# Patient Record
Sex: Male | Born: 1950 | Race: Black or African American | Hispanic: No | Marital: Single | Smoking: Never smoker
Health system: Southern US, Community
[De-identification: ages and names within clinical notes are randomized; demographics above are authoritative.]

## PROBLEM LIST (undated history)

## (undated) DIAGNOSIS — M1990 Unspecified osteoarthritis, unspecified site: Secondary | ICD-10-CM

## (undated) DIAGNOSIS — I10 Essential (primary) hypertension: Secondary | ICD-10-CM

## (undated) DIAGNOSIS — R972 Elevated prostate specific antigen [PSA]: Secondary | ICD-10-CM

## (undated) DIAGNOSIS — F1411 Cocaine abuse, in remission: Secondary | ICD-10-CM

## (undated) HISTORY — PX: JOINT REPLACEMENT: SHX530

## (undated) HISTORY — DX: Essential (primary) hypertension: I10

## (undated) HISTORY — PX: PROSTATE BIOPSY: SHX241

---

## 2011-06-14 ENCOUNTER — Other Ambulatory Visit: Payer: Self-pay | Admitting: Orthopedic Surgery

## 2011-06-14 ENCOUNTER — Encounter (HOSPITAL_COMMUNITY): Payer: Self-pay

## 2011-06-14 LAB — CBC
MCH: 27.6 pg (ref 26.0–34.0)
MCV: 85 fL (ref 78.0–100.0)
Platelets: 188 10*3/uL (ref 150–400)
RDW: 13.2 % (ref 11.5–15.5)

## 2011-06-14 LAB — URINALYSIS, ROUTINE W REFLEX MICROSCOPIC
Glucose, UA: NEGATIVE mg/dL
Leukocytes, UA: NEGATIVE
Nitrite: NEGATIVE
Specific Gravity, Urine: 1.021 (ref 1.005–1.030)
pH: 6.5 (ref 5.0–8.0)

## 2011-06-14 LAB — COMPREHENSIVE METABOLIC PANEL
AST: 38 U/L — ABNORMAL HIGH (ref 0–37)
Albumin: 3.8 g/dL (ref 3.5–5.2)
Calcium: 9.8 mg/dL (ref 8.4–10.5)
Creatinine, Ser: 1.08 mg/dL (ref 0.50–1.35)
GFR calc non Af Amer: >60 mL/min (ref >60)

## 2011-06-14 LAB — APTT: aPTT: 28 seconds (ref 24–37)

## 2011-06-14 LAB — PROTIME-INR
INR: 1.07 (ref 0.00–1.49)
Prothrombin Time: 14.1 seconds (ref 11.6–15.2)

## 2011-06-14 LAB — SURGICAL PCR SCREEN: Staphylococcus aureus: NEGATIVE

## 2011-06-21 ENCOUNTER — Inpatient Hospital Stay (HOSPITAL_COMMUNITY)
Admission: RE | Admit: 2011-06-21 | Discharge: 2011-06-24 | DRG: 470 | Disposition: A | Discharge status: Home Health | Payer: Self-pay | Source: Ambulatory Visit | Attending: Orthopedic Surgery | Admitting: Orthopedic Surgery

## 2011-06-21 DIAGNOSIS — Z01812 Encounter for preprocedural laboratory examination: Secondary | ICD-10-CM

## 2011-06-21 DIAGNOSIS — E871 Hypo-osmolality and hyponatremia: Secondary | ICD-10-CM | POA: Diagnosis not present

## 2011-06-21 DIAGNOSIS — M1710 Unilateral primary osteoarthritis, unspecified knee: Principal | ICD-10-CM | POA: Diagnosis present

## 2011-06-21 HISTORY — PX: KNEE ARTHROPLASTY: SHX992

## 2011-06-21 LAB — TYPE AND SCREEN: ABO/RH(D): B NEG

## 2011-06-22 LAB — CBC
Hemoglobin: 11 g/dL — ABNORMAL LOW (ref 13.0–17.0)
MCV: 85.5 fL (ref 78.0–100.0)
Platelets: 145 10*3/uL — ABNORMAL LOW (ref 150–400)
RBC: 3.93 MIL/uL — ABNORMAL LOW (ref 4.22–5.81)
WBC: 6.6 10*3/uL (ref 4.0–10.5)

## 2011-06-22 LAB — BASIC METABOLIC PANEL
CO2: 28 mEq/L (ref 19–32)
Chloride: 104 mEq/L (ref 96–112)
Creatinine, Ser: 1.05 mg/dL (ref 0.50–1.35)

## 2011-06-22 NOTE — Op Note (Signed)
NAMERODGER, GIANGREGORIO NO.:  [id number]  MEDICAL RECORD NO.:  [id number]  LOCATION:  0003                         FACILITY:  [facility]  PHYSICIAN:  Ollen Gross, M.D.    DATE OF BIRTH:  1951/03/19  DATE OF PROCEDURE:  06/21/2011 DATE OF DISCHARGE:                              OPERATIVE REPORT   PREOPERATIVE DIAGNOSIS:  Osteoarthritis, left knee.  POSTOPERATIVE DIAGNOSIS:  Osteoarthritis, left knee.  PROCEDURE:  Left total knee arthroplasty.  SURGEON:  Ollen Gross, MD  ASSISTANT:  Alexzandrew L. Perkins, P.A.C.  ANESTHESIA:  Spinal.  ESTIMATED BLOOD LOSS:  Minimal.  DRAIN:  Hemovac x1.  TOURNIQUET TIME:  80 minutes at 300 mmHg.  COMPLICATIONS:  None.  CONDITION:  Stable to recovery room.  BRIEF CLINICAL NOTE:  Marc [name] is a 60 year old male with severe end-stage arthritis of his left knee with progressively worsening pain and dysfunction.  He has failed nonop management, presents for total knee arthroplasty.  PROCEDURE IN DETAIL:  After successful administration of spinal anesthetic, a tourniquet was placed high on his left thigh and his left lower extremity was prepped and draped in the usual sterile fashion. Extremity was wrapped in Esmarch, knee flexed, tourniquet inflated to 300 mmHg.  His preop flexion was only about 40 degrees.  Midline incision was made with 10 blade through subcutaneous tissue to the level of the extensor mechanism.  A fresh blade was used to make a medial parapatellar arthrotomy.  This tissue was terribly scarred down.  I had to recreate the medial and lateral gutters with careful dissection subperiosteally.  The soft tissue on the proximal medial tibia was then subperiosteally elevated to the joint line with the knife and into the semimembranosus bursa with a Cobb elevator.  Tremendous amount of spurs adhered to that.  I had to remove the spurs.  Could not evert the patella as it was wanting to avulse off the tibial  tubercle.  We thus subluxed the patella laterally, flexed the knee about 60 degrees.  I removed further scar tissue and was able to eventually get the knee flexed up close to 90.  ACL and PCL were essentially gone.  There was absolutely no cartilage left in the joint.  His anatomic structures are grossly deformed by the large amount of osteophytes.  I carefully scoped out the femur separating the osteophytes from the native bone.  We then drilled into the femoral canal.  The canal was thoroughly irrigated with saline.  5-degree left valgus alignment guide was placed and distal femoral cutting block was pinned to remove 12 mm off the distal femur. I took 12 because of the large preop flexion contracture.  Distal femoral resection was made with an oscillating saw.  Sizing block was placed next and size 6 was most appropriate.  Rotation was marked off the epicondylar axis.  Size 6 cutting block was placed and the anterior, posterior and chamfer cuts were made.  Once we made these cuts, then it was easier to delineate between the osteophytes and native bone.  I was able to remove the rest of the osteophytes from the medial, lateral, and anterior aspects of the femur.  The tibia was  then subluxed forward.  There were no menisci to remove as they were completely gone.  The extramedullary tibial alignment guide was placed referencing proximally at the medial aspect of the tibial tubercle and distally along the second metatarsal axis and tibial crest. There was a large groove laterally in the tibia when I made my resection level to go just below that groove.  This was little larger resection than usual.  Thus, I decided to use an MBT tray with augments.  The resection was made with an oscillating saw.  Size 6 was the most appropriate tibial component.  Proximal tibia was prepared with a modular drill for the MBT revision tray and then the keel punch for size 6.  The trial size 6 tibia was  placed.  Intercondylar block was then placed for the size 6 on the femur.  Intercondylar cuts made.  Size 6 posterior stabilized femur was then placed. We started with a 15 mm posterior stabilized rotating platform insert trial.  I went up to 17.5 and there was still a tiny bit of play, so I realized I was going to need to place 5 mm augments both medial and lateral on the tibia to tighten up both the flexion and extension gaps.  This was done and then with a 15 mm trial, full extension was achieved with excellent varus- valgus and anterior-posterior balance throughout full range of motion. Patella was everted and there was gross defect superolateral.  I was able to remove the osteophytes and resect the patella down to thickness of about 12 mm.  41 template was placed.  Lug holes were drilled.  Trial patella was placed and it tracked normally.  Osteophytes were then removed off the posterior femur with the trial in place.  All trials were removed and the cut bone surfaces were prepared with pulsatile lavage.  Cement was mixed and once ready for implantation, the size 6 MBT revision tray with the 5 mm medial and lateral augments, the size 6 posterior stabilized femur, and the 41 patella were all cemented into place and patella was held with a clamp.  Trial 15 mm inserts were placed.  Knee held in full extension.  All extruded cement removed. When cement was fully hardened, then the permanent 15 mm posterior stabilized rotating platform insert was placed in the tibial tray.  The wound was copiously irrigated with saline solution and then the arthrotomy closed over Hemovac drain with interrupted #1 PDS.  Flexion against gravity was about 115 degrees which is a vast improvement from preop.  Tourniquet was then released.  Total time 80 minutes. Subcutaneous was then closed with interrupted 2-0 Vicryl, subcuticular with running 4-0 Monocryl.  Catheter for the Marcaine pain pump was placed and  the pump was initiated.  Incision was cleaned and dried and Steri-Strips and a bulky sterile dressing applied.  He was then placed into a knee immobilizer, awakened and transported to recovery in stable condition.     Ollen Gross, M.D.     FA/MEDQ  D:  06/21/2011  T:  06/21/2011  Job:  [id number]  Electronically Signed by Ollen Gross M.D. on 06/22/2011 03:31:35 PM

## 2011-06-23 LAB — CBC
HCT: 29.8 % — ABNORMAL LOW (ref 39.0–52.0)
MCV: 83.5 fL (ref 78.0–100.0)
RBC: 3.57 MIL/uL — ABNORMAL LOW (ref 4.22–5.81)
RDW: 12.8 % (ref 11.5–15.5)
WBC: 8.4 10*3/uL (ref 4.0–10.5)

## 2011-06-23 LAB — BASIC METABOLIC PANEL
BUN: 7 mg/dL (ref 6–23)
CO2: 26 mEq/L (ref 19–32)
Chloride: 101 mEq/L (ref 96–112)
Creatinine, Ser: 0.92 mg/dL (ref 0.50–1.35)

## 2011-06-24 LAB — CBC
HCT: 27.8 % — ABNORMAL LOW (ref 39.0–52.0)
MCHC: 33.5 g/dL (ref 30.0–36.0)
MCV: 83.2 fL (ref 78.0–100.0)
RDW: 12.8 % (ref 11.5–15.5)

## 2011-06-24 LAB — BASIC METABOLIC PANEL
BUN: 7 mg/dL (ref 6–23)
Creatinine, Ser: 0.84 mg/dL (ref 0.50–1.35)
GFR calc Af Amer: >60 mL/min (ref >60)
GFR calc non Af Amer: >60 mL/min (ref >60)

## 2011-07-10 NOTE — H&P (Signed)
[name], [name]                ACCOUNT NO.:  [id number]  MEDICAL RECORD NO.:  [id number]  LOCATION:                                 FACILITY:  PHYSICIAN:  Ollen Gross, M.D.    DATE OF BIRTH:  [birth year]  DATE OF ADMISSION:  06/21/2011 DATE OF DISCHARGE:                             HISTORY & PHYSICAL   CHIEF COMPLAINT:  Left knee pain.  HISTORY OF PRESENT ILLNESS:  The patient is a 60 year old male who has been seen by Dr. Lequita Halt for ongoing left knee pain.  He has known end- stage arthritis and has had multiple surgeries on his knee in the past. He has been told he needed a knee replacement years ago but has put on off.  He is at a point now where he would like to have that done.  Risks and benefits have been discussed.  He would like to proceed with surgery.  ALLERGIES:  No known drug allergies.  CURRENT MEDICATIONS:  Omega fatty acid, multivitamin.  PAST MEDICAL HISTORY:  Negative except for the arthritis.  PAST SURGICAL HISTORY:  He has had left knee surgery x4.  FAMILY HISTORY:  Father living, age 90, with prostate cancer.  Mother living, age 38, with stroke.  He has 2 brothers, age 33 and 71, in good health.  One brother passed away age 49 with heart attack.  SOCIAL HISTORY:  He is single, works as a Midwife.  Nonsmoker. Social, very rare intake of alcohol.  Does have a caregiver lined up, has 2 steps entering his home.  REVIEW OF SYSTEMS:  GENERAL:  No fevers, chills, or night sweats. NEURO:  No seizures, syncope, or paralysis.  RESPIRATORY:  No shortness breath, productive cough, or hemoptysis.  CARDIOVASCULAR:  No chest pain, no angina, no orthopnea.  GI:  No nausea, vomiting, diarrhea, or constipation.  GU:  No dysuria, hematuria, or discharge. MUSCULOSKELETAL: Left knee pain.  PHYSICAL EXAMINATION:  VITAL SIGNS:  Pulse 76, respirations 12, blood pressure 118/80. GENERAL:  A 60 year old African American male, tall, muscular frame, alert, oriented,  cooperative.  Good historian. HEENT:  Normocephalic, atraumatic.  Pupils round and reactive.  EOMs intact. NECK:  Supple. CHEST:  Clear. HEART:  Regular rate and rhythm without murmur.  S1, S2 noted. ABDOMEN:  Soft, nontender.  Bowel sounds present. RECTAL:  Not done, not pertinent to present illness. BREASTS:  Not done, not pertinent to present illness. GENITALIA:  Not done, not pertinent to present illness. EXTREMITIES:  Left knee, massive swelling, especially soft tissue.  He does have some quad atrophy.  Range of motion is 10 to 85 actively but 0 to 90 passively.  He has some moderate pseudolaxity.  No gross instability.  IMPRESSION:  Osteoarthritis, left knee.  PLAN:  The patient will be admitted to [facility] to undergo left total knee replacement arthroplasty.  Surgery will be performed by Dr. Ollen Gross.     Marc [name], P.A.C.   ______________________________ Ollen Gross, M.D.    ALP/MEDQ  D:  06/22/2011  T:  06/22/2011  Job:  [id number]  Electronically Signed by Patrica Duel P.A.C. on 06/29/2011 09:34:59 AM Electronically Signed by Homero Fellers  Sible Straley M.D. on 07/10/2011 11:38:12 AM

## 2011-07-11 ENCOUNTER — Ambulatory Visit: Payer: Self-pay | Attending: Orthopedic Surgery | Admitting: Rehabilitative and Restorative Service Providers"

## 2011-07-11 DIAGNOSIS — R262 Difficulty in walking, not elsewhere classified: Secondary | ICD-10-CM | POA: Insufficient documentation

## 2011-07-11 DIAGNOSIS — IMO0001 Reserved for inherently not codable concepts without codable children: Secondary | ICD-10-CM | POA: Insufficient documentation

## 2011-07-11 DIAGNOSIS — M25569 Pain in unspecified knee: Secondary | ICD-10-CM | POA: Insufficient documentation

## 2011-07-12 ENCOUNTER — Ambulatory Visit: Payer: Self-pay | Admitting: Physical Therapy

## 2011-07-12 NOTE — Discharge Summary (Signed)
Marc [name], Marc [name] NO.:  [id number]  MEDICAL RECORD NO.:  [id number]  LOCATION:  1610                         FACILITY[facility]  [facility]  PHYSICIAN:  Ollen Gross, M.D.    DATE OF BIRTH:  05/10/1951  DATE OF ADMISSION:  06/21/2011 DATE OF DISCHARGE:  06/24/2011                              DISCHARGE SUMMARY   ADMITTING DIAGNOSIS:  Arthritis, left knee.  DISCHARGE DIAGNOSES: 1. Osteoarthritis left knee, status post left total knee replacement     arthroplasty. 2. Mild postop acute blood loss anemia. 3. Postop hyponatremia, improved.  PROCEDURE:  On June 21, 2011, left total knee.  Surgeon, Dr. Lequita Halt. Assistant, Patrica Duel, PA-C.  Anesthesia spinal.  Tourniquet time 80 minutes.  CONSULTS:  None.  BRIEF HISTORY:  Marc [name] is a 60 year old male with severe end-stage arthritis of the left knee, progressive worsening pain and dysfunction, failed nonoperative management, now presents for total knee arthroplasty.  LABORATORY DATA:  Preop CBC on admission was not scanned in the chart, but hemoglobin was 11; postop down to 10.1; last done hemoglobin and hematocrit 9.3 and 27.8.  Chem panel on admission not scanned in the chart, but the serial BMET's were followed for 72 hours;  sodium did drop from 137 to 132, back up to 133.  Remaining electrolytes remained within normal limits.  Blood group type B negative.  HOSPITAL COURSE:  The patient was admitted to [facility], taken to OR, underwent above-stated procedure without complication.  The patient tolerated procedure well, later transferred to recovery room and then to the orthopedic floor.  Started on p.o. and IV analgesic for pain control following surgery.  Given 24 hours postop IV antibiotics.  He has had moderate pain through the night of the surgery which was especially expected due to the severity of his arthritis that was found in his knees.  He was doing a little bit better on morning  of day #1. Hemovac drain still had some output, so we left it an extra day.  He had good urinary output.  Labs looked good.  His hemoglobin was stable.  By day #2, his hemoglobin was down a little bit.  He was asymptomatic with this and his sodium was off a little bit due to dilutional component, but he was diuresing his fluids well, so we just discontinued his fluids.  Once he was eating and drinking okay, we will recheck BMET next morning.  He continued to get up with therapy and had a rough night because of the first day, but he is still little bit better.  Dressing changed, incision looked very good and he continued to progress with therapy.  We pulled the Hemovac drain at time of the dressing change on day #2.  By day #3, he was doing much better.  He was up walking.  He was progressing with his therapy, had met goals, and it was decided the patient be discharged home at that time.  DISCHARGE PLAN: 1. The patient was discharged home on June 24, 2011. 2. Discharge diagnoses, please see above. 3. Discharge medications, Dilaudid, Robaxin, Xarelto. 4. Diet, as tolerated. 5. Activity, weightbearing as tolerated, total knee protocol. 6.  Follow up in 2 weeks.  DISPOSITION:  Home.  CONDITION UPON DISCHARGE:  Improved.     [name], P.A.C.   ______________________________ Ollen Gross, M.D.    ALP/MEDQ  D:  07/06/2011  T:  07/07/2011  Job:  [id number]  Electronically Signed by Patrica Duel P.A.C. on 07/11/2011 10:11:01 AM Electronically Signed by Ollen Gross M.D. on 07/12/2011 10:25:48 AM

## 2011-07-13 ENCOUNTER — Ambulatory Visit: Payer: Self-pay | Admitting: Rehabilitative and Restorative Service Providers"

## 2011-07-18 ENCOUNTER — Ambulatory Visit: Payer: Self-pay | Admitting: Physical Therapy

## 2011-07-19 ENCOUNTER — Ambulatory Visit: Payer: Self-pay | Admitting: Physical Therapy

## 2011-07-20 ENCOUNTER — Ambulatory Visit: Payer: Self-pay | Admitting: Physical Therapy

## 2011-07-24 ENCOUNTER — Ambulatory Visit: Payer: Self-pay | Admitting: Physical Therapy

## 2011-07-26 ENCOUNTER — Ambulatory Visit: Payer: Self-pay | Admitting: Rehabilitative and Restorative Service Providers"

## 2011-07-27 ENCOUNTER — Ambulatory Visit: Payer: Self-pay | Admitting: Physical Therapy

## 2011-08-01 ENCOUNTER — Ambulatory Visit: Payer: Self-pay | Attending: Orthopedic Surgery | Admitting: Physical Therapy

## 2011-08-01 DIAGNOSIS — IMO0001 Reserved for inherently not codable concepts without codable children: Secondary | ICD-10-CM | POA: Insufficient documentation

## 2011-08-01 DIAGNOSIS — R262 Difficulty in walking, not elsewhere classified: Secondary | ICD-10-CM | POA: Insufficient documentation

## 2011-08-01 DIAGNOSIS — M25569 Pain in unspecified knee: Secondary | ICD-10-CM | POA: Insufficient documentation

## 2011-08-03 ENCOUNTER — Ambulatory Visit: Payer: Self-pay | Admitting: Physical Therapy

## 2011-08-08 ENCOUNTER — Ambulatory Visit: Payer: Self-pay | Admitting: Rehabilitative and Restorative Service Providers"

## 2011-08-10 ENCOUNTER — Ambulatory Visit: Payer: Self-pay | Admitting: Physical Therapy

## 2011-08-14 ENCOUNTER — Ambulatory Visit: Payer: Self-pay | Admitting: Rehabilitative and Restorative Service Providers"

## 2011-08-17 ENCOUNTER — Ambulatory Visit: Payer: Self-pay | Admitting: Physical Therapy

## 2011-08-21 ENCOUNTER — Ambulatory Visit: Payer: Self-pay | Admitting: Rehabilitative and Restorative Service Providers"

## 2011-08-23 ENCOUNTER — Ambulatory Visit: Payer: Self-pay | Admitting: Rehabilitative and Restorative Service Providers"

## 2012-04-12 ENCOUNTER — Emergency Department (INDEPENDENT_AMBULATORY_CARE_PROVIDER_SITE_OTHER)
Admission: EM | Admit: 2012-04-12 | Discharge: 2012-04-12 | Disposition: A | Discharge status: Home / Self Care | Payer: Self-pay | Source: Home / Self Care | Attending: Emergency Medicine | Admitting: Emergency Medicine

## 2012-04-12 ENCOUNTER — Encounter (HOSPITAL_COMMUNITY): Payer: Self-pay | Admitting: Emergency Medicine

## 2012-04-12 DIAGNOSIS — J029 Acute pharyngitis, unspecified: Secondary | ICD-10-CM

## 2012-04-12 MED ORDER — ALUM & MAG HYDROXIDE-SIMETH 200-200-20 MG/5ML PO SUSP: 5.0000 mL | Freq: Four times a day (QID) | ORAL | Status: AC | PRN

## 2012-04-12 MED ORDER — DIPHENHYDRAMINE HCL 12.5 MG/5ML PO LIQD: 12.5000 mg | Freq: Four times a day (QID) | ORAL | Status: DC | PRN

## 2012-04-12 MED ORDER — GI COCKTAIL ~~LOC~~
ORAL | Status: AC
  Filled 2012-04-12: qty 30

## 2012-04-12 MED ORDER — LIDOCAINE VISCOUS 2 % MT SOLN: 10.0000 mL | Freq: Three times a day (TID) | OROMUCOSAL | Status: AC | PRN

## 2012-04-12 MED ORDER — IBUPROFEN 600 MG PO TABS: 600.0000 mg | ORAL_TABLET | Freq: Four times a day (QID) | ORAL | Status: AC | PRN

## 2012-04-12 MED ORDER — GI COCKTAIL ~~LOC~~
30.0000 mL | Freq: Once | ORAL | Status: AC
  Administered 2012-04-12: 30 mL via ORAL

## 2012-04-12 NOTE — ED Notes (Signed)
Pt c/o sore throat for 2 days that is getting progressively worse. He also has a cough and started getting a runny nose today.

## 2012-04-12 NOTE — Discharge Instructions (Signed)
Take medication as written. You may also try some Pepcid, Prevacid, or other H2 blocker. This will help with any reflux. Return if you've a fever above 100.4, if you're unable to open your mouth, if you have trouble moving your neck, or any other concerns. Go to www.goodrx.com to look up your medications. This will give you a list of where you can find your prescriptions at the most affordable prices.   Call [facility]  [phone]  If you have no primary doctor, here are some resources that may be helpful:  Medicaid-accepting [facility] Providers:   - [facility]- [address]      [phone]      Mon-Fri 9am-7pm, Sat 9am-1pm   - [facility]- [address] [place]      [phone]   - [facility]- [address] [place]      [phone]   [facility]- [address]      [phone]   - Renaye Rakers- [address] [place], [address]      [phone]      Only accepts [place] patients       after they have her name applied to their card   Self Pay (no insurance) in [place]:   - Sickle Cell Patients: Dr Willey Blade, [facility]      [address] [place]      [phone]   - [phone]   - Physician Referral Service- [phone]   - [facility]- [address] [place]      [phone]   [facility]- [address]   - [facility]- see information above      (Speak to [employer] if you do not have insurance)   - [facility]- [address] [place]      [phone]   - [facility] [place]- [address]      [phone]   - [facility]- [address]      [phone]   - Dr Julio Sicks-  [address], [place]      [phone]   - [facility]- [address]      [phone]   - [facility]- [address]      [phone]      Also  [address]      [phone]   - [facility]- [address]      [phone]      1st and 3rd Saturday every month, 10am-1pm    Other agencies that provide inexpensive medical care:     [facility]  [phone]    [facility]  [phone]    [facility]  [phone]    [facility]  [phone] [address] [place] [address]    [facility]  [phone]    [facility]  [phone] [facility] [phone]   [address] [place], [place] [address]  Chronic Pain Problems Contact [facility]  [phone] Patients need to be referred by their primary care doctor.  [facility]     [employer]  [facility]. [address] [place]                       [address]      [address] [place] [address]   [phone] (After Hours)  General Information: Finding a doctor when you do not have health insurance can be tricky. Although you are not limited by an insurance plan, you are of course limited by her finances and how much but he can pay out of pocket.  What are your options if you don't have health insurance?   1) Find a Librarian, academic and Pay Out of Pocket Although you won't have to find out who is covered by your insurance plan, it is a good idea to ask around and get recommendations. You will then need to call the office and see if the doctor you have chosen will accept you as a new patient and what types of options they offer for patients who are self-pay. Some doctors offer discounts or will set up payment plans for their patients who do not have insurance, but you will need to ask so you aren't surprised when you get to your appointment.  2) Contact Your Local Health Department Not all health departments have doctors that can see patients for sick visits, but  many do, so it is worth a call to see if yours does. If you don't know where your local health department is, you can check in your phone book. The CDC also has a tool to help you locate your state's health department, and many state websites also have listings of all of their local health departments.  3) Find a Walk-in Clinic If your illness is not likely to be very severe or complicated, you may want to try a walk in clinic. These are popping up all over the country in pharmacies, drugstores, and shopping centers. They're usually staffed by nurse practitioners or physician assistants that have been trained to treat common illnesses and complaints. They're usually fairly quick and inexpensive. However, if you have serious medical issues or chronic medical problems, these are probably not your best option

## 2012-04-12 NOTE — ED Provider Notes (Signed)
History     CSN: [id number]  Arrival date & time 04/12/12  1478   First MD Initiated Contact with Patient 04/12/12 [phone]      Chief Complaint  Patient presents with  . Sore Throat    (Consider location/radiation/quality/duration/timing/severity/associated sxs/prior treatment) HPI Comments: Patient reports sore, scratchy throat, whitish swelling on the right upper aspect of his tonsil starting 2 days ago. Reports raspy voice, pain with swallowing, running nose, and nonproductive cough starting last night. Patient's throat is sore all day. Or swelling. No alleviating factors. Tried Alka-Seltzer cold plus without relief.  No nausea, vomiting, fevers. No "hot potato" voice, sensation of throat swelling shut, facial swelling, neck pain, difficulty swallowing, trismus. No history reflux, and no reflux symptoms currently. No known sick contacts.   ROS as noted in HPI. All other ROS negative.   Patient is a 61 y.o. male presenting with pharyngitis. The history is provided by the patient. No language interpreter was used.  Sore Throat This is a new problem. The current episode started 2 days ago. The problem has been gradually worsening. Associated symptoms include headaches. Pertinent negatives include no chest pain, no abdominal pain and no shortness of breath. The symptoms are aggravated by swallowing. The symptoms are relieved by nothing. He has tried nothing for the symptoms. The treatment provided no relief.    History reviewed. No pertinent past medical history.  History reviewed. No pertinent past surgical history.  History reviewed. No pertinent family history.  History  Substance Use Topics  . Smoking status: Never Smoker   . Smokeless tobacco: Not on file  . Alcohol Use: No      Review of Systems  Respiratory: Negative for shortness of breath.   Cardiovascular: Negative for chest pain.  Gastrointestinal: Negative for abdominal pain.  Neurological: Positive for headaches.     Allergies  Review of patient's allergies indicates no known allergies.  Home Medications   Current Outpatient Rx  Name Route Sig Dispense Refill  . ALKA-SELTZER PLUS COLD PO Oral Take by mouth.      BP 139/98  Pulse 83  Temp(Src) 98.3 F (36.8 C) (Oral)  Resp 18  SpO2 100%  Physical Exam  Nursing note and vitals reviewed. Constitutional: He is oriented to person, place, and time. He appears well-developed and well-nourished.  HENT:  Head: Normocephalic and atraumatic. No trismus in the jaw.  Right Ear: Tympanic membrane normal.  Left Ear: Tympanic membrane normal.  Nose: Nose normal. Right sinus exhibits no maxillary sinus tenderness and no frontal sinus tenderness. Left sinus exhibits no maxillary sinus tenderness and no frontal sinus tenderness.  Mouth/Throat: Uvula is midline and mucous membranes are normal. Posterior oropharyngeal erythema present. No oropharyngeal exudate, posterior oropharyngeal edema or tonsillar abscesses.         Mildly tender, firm whitish mass at superior aspect of right tonsil c/w concretion. Tonsils otherwise normal. No palatal petechia.  Eyes: Conjunctivae and EOM are normal.  Neck: Normal range of motion.       Bilateral shotty cervical lymphadenopathy  Cardiovascular: Normal rate.   Pulmonary/Chest: Effort normal. No respiratory distress.  Abdominal: He exhibits no distension.  Musculoskeletal: Normal range of motion.  Neurological: He is alert and oriented to person, place, and time.  Skin: Skin is warm and dry.  Psychiatric: He has a normal mood and affect. His behavior is normal.    ED Course  Procedures (including critical care time)   Labs Reviewed  POCT RAPID STREP A [facility]  ONLY)   No results found.   No diagnosis found. Results for orders placed during the hospital encounter of 04/12/12  POCT RAPID STREP A ([facility]RG CARE ONLY)      Component Value Range   Streptococcus, Group A Screen (Direct) NEGATIVE  NEGATIVE        MDM  RAS negative. H&P are suggestive of  reflux pharyngitis, but could also have a viral pharyngitis. No evidence of peritonsillar abscess, uvulitis, epiglottitis. Discussed with patient that antibiotics not indicated at this time. Supportive treatment. Will have followup with primary care physician of choice as needed.  Luiz Blare, MD 04/12/12 1007

## 2012-09-29 ENCOUNTER — Emergency Department (HOSPITAL_COMMUNITY)
Admission: EM | Admit: 2012-09-29 | Discharge: 2012-09-29 | Disposition: A | Discharge status: Home / Self Care | Payer: Self-pay | Attending: Emergency Medicine | Admitting: Emergency Medicine

## 2012-09-29 ENCOUNTER — Encounter (HOSPITAL_COMMUNITY): Payer: Self-pay | Admitting: Emergency Medicine

## 2012-09-29 ENCOUNTER — Emergency Department (HOSPITAL_COMMUNITY): Payer: Self-pay

## 2012-09-29 DIAGNOSIS — R079 Chest pain, unspecified: Secondary | ICD-10-CM | POA: Insufficient documentation

## 2012-09-29 DIAGNOSIS — R0602 Shortness of breath: Secondary | ICD-10-CM | POA: Insufficient documentation

## 2012-09-29 DIAGNOSIS — R5381 Other malaise: Secondary | ICD-10-CM | POA: Insufficient documentation

## 2012-09-29 DIAGNOSIS — F1410 Cocaine abuse, uncomplicated: Secondary | ICD-10-CM | POA: Insufficient documentation

## 2012-09-29 LAB — CBC
HCT: 41.2 % (ref 39.0–52.0)
Hemoglobin: 14.3 g/dL (ref 13.0–17.0)
MCH: 28.9 pg (ref 26.0–34.0)
MCHC: 34.7 g/dL (ref 30.0–36.0)

## 2012-09-29 LAB — BASIC METABOLIC PANEL
BUN: 11 mg/dL (ref 6–23)
GFR calc non Af Amer: 76 mL/min — ABNORMAL LOW (ref >90)
Glucose, Bld: 115 mg/dL — ABNORMAL HIGH (ref 70–99)
Potassium: 3.2 mEq/L — ABNORMAL LOW (ref 3.5–5.1)

## 2012-09-29 LAB — RAPID URINE DRUG SCREEN, HOSP PERFORMED
Amphetamines: NOT DETECTED
Benzodiazepines: NOT DETECTED
Opiates: NOT DETECTED

## 2012-09-29 LAB — GLUCOSE, CAPILLARY

## 2012-09-29 MED ORDER — NITROGLYCERIN 0.4 MG SL SUBL
SUBLINGUAL_TABLET | SUBLINGUAL | Status: AC
  Administered 2012-09-29: 0.4 mg via SUBLINGUAL
  Filled 2012-09-29: qty 25

## 2012-09-29 MED ORDER — LORAZEPAM 2 MG/ML IJ SOLN
1.0000 mg | Freq: Once | INTRAMUSCULAR | Status: AC
  Administered 2012-09-29: 1 mg via INTRAVENOUS
  Filled 2012-09-29: qty 1

## 2012-09-29 MED ORDER — POTASSIUM CHLORIDE CRYS ER 20 MEQ PO TBCR
40.0000 meq | EXTENDED_RELEASE_TABLET | Freq: Once | ORAL | Status: AC
  Administered 2012-09-29: 40 meq via ORAL
  Filled 2012-09-29: qty 2

## 2012-09-29 MED ORDER — SODIUM CHLORIDE 0.9 % IV SOLN
INTRAVENOUS | Status: DC | PRN
  Administered 2012-09-29: 20 mL/h via INTRAVENOUS

## 2012-09-29 MED ORDER — NITROGLYCERIN 0.4 MG SL SUBL
0.4000 mg | SUBLINGUAL_TABLET | SUBLINGUAL | Status: DC | PRN
  Administered 2012-09-29 (×3): 0.4 mg via SUBLINGUAL

## 2012-09-29 MED ORDER — ASPIRIN 81 MG PO CHEW
CHEWABLE_TABLET | ORAL | Status: AC
  Administered 2012-09-29: 324 mg
  Filled 2012-09-29: qty 4

## 2012-09-29 MED ORDER — ASPIRIN 325 MG PO TABS: 325.0000 mg | ORAL_TABLET | ORAL | Status: DC

## 2012-09-29 NOTE — ED Provider Notes (Signed)
History     CSN: [id number]  Arrival date & time 09/29/12  1300   First MD Initiated Contact with Patient 09/29/12 1436      Chief Complaint  Patient presents with  . Chest Pain  . Shortness of Breath  . Weakness  . Cocaine Use     (Consider location/radiation/quality/duration/timing/severity/associated sxs/prior treatment) Patient is a 61 y.o. male presenting with chest pain and weakness. The history is provided by the patient.  Chest Pain Pertinent negatives for primary symptoms include no palpitations, no abdominal pain and no vomiting.  Pertinent negatives for associated symptoms include no numbness.    Weakness Primary symptoms do not include headaches or vomiting.  pt states used cocaine late last pm, noted onset cp, mid chest/midline, dull, not radiating, constant, not pleuritic. No associated nv, diaphoresis or sob. No change w position, no change w activity level. Constant. Denies cough or uri c/o. No fever or chills. Denies hx gerd. No leg pain or swelling, no dvt or pe hx. Denies chest wall injury, strain or fall. No headache. No numbness/weakness.  Denies hx cad or any family hx cad.     History reviewed. No pertinent past medical history.  Past Surgical History  Procedure Date  . Joint replacement     History reviewed. No pertinent family history.  History  Substance Use Topics  . Smoking status: Never Smoker   . Smokeless tobacco: Not on file  . Alcohol Use: No      Review of Systems  Constitutional: Negative for chills.  HENT: Negative for neck pain.   Eyes: Negative for visual disturbance.  Cardiovascular: Negative for palpitations and leg swelling.  Gastrointestinal: Negative for vomiting and abdominal pain.  Genitourinary: Negative for flank pain.  Musculoskeletal: Negative for back pain.  Skin: Negative for rash.  Neurological: Negative for syncope, numbness and headaches.  Hematological: Does not bruise/bleed easily.    Psychiatric/Behavioral: Negative for confusion.    Allergies  Review of patient's allergies indicates no known allergies.  Home Medications   Current Outpatient Rx  Name  Route  Sig  Dispense  Refill  . AYR SALINE NASAL GEL NA   Nasal   Place 2 puffs into the nose daily as needed. Nasal congestion         . OVER THE COUNTER MEDICATION   Oral   Take 2 tablets by mouth daily. Emergen-C dissolvable tablets.           BP 126/69  Pulse 92  Temp 98.6 F (37 C) (Oral)  Resp 20  SpO2 98%  Physical Exam  Nursing note and vitals reviewed. Constitutional: He is oriented to person, place, and time. He appears well-developed and well-nourished. No distress.  HENT:  Head: Atraumatic.  Mouth/Throat: Oropharynx is clear and moist.  Eyes: Conjunctivae normal are normal.  Neck: Neck supple. No tracheal deviation present.  Cardiovascular: Normal rate, regular rhythm, normal heart sounds and intact distal pulses.  Exam reveals no gallop and no friction rub.   No murmur heard. Pulmonary/Chest: Effort normal and breath sounds normal. No accessory muscle usage. No respiratory distress. He exhibits tenderness.       No crepitus  Abdominal: Soft. Bowel sounds are normal. He exhibits no distension. There is no tenderness.  Musculoskeletal: Normal range of motion. He exhibits no edema and no tenderness.  Neurological: He is alert and oriented to person, place, and time.  Skin: Skin is warm and dry.  Psychiatric: He has a normal mood and affect.  ED Course  Procedures (including critical care time)  Results for orders placed during the hospital encounter of 09/29/12  GLUCOSE, CAPILLARY      Component Value Range   Glucose-Capillary 105 (*) 70 - 99 mg/dL   Comment 1 Documented in Chart     Comment 2 Notify RN    CBC      Component Value Range   WBC 7.7  4.0 - 10.5 K/uL   RBC 4.95  4.22 - 5.81 MIL/uL   Hemoglobin 14.3  13.0 - 17.0 g/dL   HCT 16.1  09.6 - 04.5 %   MCV 83.2  78.0  - 100.0 fL   MCH 28.9  26.0 - 34.0 pg   MCHC 34.7  30.0 - 36.0 g/dL   RDW 40.9  81.1 - 91.4 %   Platelets 182  150 - 400 K/uL  BASIC METABOLIC PANEL      Component Value Range   Sodium 135  135 - 145 mEq/L   Potassium 3.2 (*) 3.5 - 5.1 mEq/L   Chloride 100  96 - 112 mEq/L   CO2 23  19 - 32 mEq/L   Glucose, Bld 115 (*) 70 - 99 mg/dL   BUN 11  6 - 23 mg/dL   Creatinine, Ser 7.82  0.50 - 1.35 mg/dL   Calcium 9.3  8.4 - 95.6 mg/dL   GFR calc non Af Amer 76 (*) >90 mL/min   GFR calc Af Amer 88 (*) >90 mL/min  POCT I-STAT TROPONIN I      Component Value Range   Troponin i, poc 0.00  0.00 - 0.08 ng/mL   Comment 3           URINE RAPID DRUG SCREEN (HOSP PERFORMED)      Component Value Range   Opiates NONE DETECTED  NONE DETECTED   Cocaine POSITIVE (*) NONE DETECTED   Benzodiazepines NONE DETECTED  NONE DETECTED   Amphetamines NONE DETECTED  NONE DETECTED   Tetrahydrocannabinol NONE DETECTED  NONE DETECTED   Barbiturates NONE DETECTED  NONE DETECTED   Dg Chest Port 1 View  09/29/2012  *RADIOLOGY REPORT*  Clinical Data: Chest pain and shortness of breath.  PORTABLE CHEST - 1 VIEW  Comparison: None  Findings: The cardiac silhouette, mediastinal and hilar contours are within normal limits.  There is tortuosity and ectasia of the thoracic aorta.  The lungs are clear.  No pleural effusion.  The bony thorax is intact.  IMPRESSION: No acute cardiopulmonary findings.   Original Report Authenticated By: Rudie Meyer, M.D.        MDM  Iv ns. Labs. Ecg. Monitor.  Ativan 1 mg iv.  Reviewed nursing notes and prior charts for additional history.     Date: 09/29/2012  Rate: 107  Rhythm: sinus tachycardia  QRS Axis: normal  Intervals: normal  ST/T Wave abnormalities: nonspecific ST/T changes  Conduction Disutrbances:lvh  Narrative Interpretation:   Old EKG Reviewed: changes noted  Recheck pt, no chest pain or sob.   Initial troponin negative.  3 hour repeat troponin pending.  If 2nd  troponin negative and pt remains asymptomatic, pt stable for d/c to close pcp/cardiology follow up.   Signed out to Dr Effie Shy to check 2nd troponin, if normal and not increasing, and pt remains asymptomatic, anticipate d/c.          Suzi Roots, MD 09/29/12 [phone]

## 2012-09-29 NOTE — ED Provider Notes (Signed)
Care received from Dr. Denton Lank to check labs prior to discharge. Repeat troponin returned negative. Vital signs, stable at 1600. There've been no further complaints noted.  Is discharged with instructions on stopping use of cocaine in following up with her primary care Dr. For blood pressure check. For chest pain. Has been asked to see cardiology.  Flint Melter, MD 09/29/12 [phone]

## 2012-09-29 NOTE — ED Notes (Addendum)
Pt states that he snorted cocaine last night.  Came in today c/o lt sided chest pain and weakness.  Pt was unable to stand up on his own.  Pt feels SOB.  Pt states he snorted 2 grams of cocaine.

## 2012-09-29 NOTE — ED Notes (Signed)
Pt complaining of increasing nasal congestion.  Sts "it's from snorting the drugs."  MD notified.

## 2012-09-29 NOTE — ED Notes (Signed)
Pt tolerated taking pills and drinking water well.

## 2013-05-29 ENCOUNTER — Observation Stay (HOSPITAL_COMMUNITY)
Admission: EM | Admit: 2013-05-29 | Discharge: 2013-05-30 | Disposition: A | Discharge status: Home / Self Care | Payer: MEDICAID | Attending: Internal Medicine | Admitting: Internal Medicine

## 2013-05-29 ENCOUNTER — Encounter (HOSPITAL_COMMUNITY): Payer: Self-pay

## 2013-05-29 ENCOUNTER — Emergency Department (HOSPITAL_COMMUNITY): Payer: Self-pay

## 2013-05-29 DIAGNOSIS — F1411 Cocaine abuse, in remission: Secondary | ICD-10-CM | POA: Diagnosis present

## 2013-05-29 DIAGNOSIS — R079 Chest pain, unspecified: Secondary | ICD-10-CM | POA: Insufficient documentation

## 2013-05-29 DIAGNOSIS — R55 Syncope and collapse: Principal | ICD-10-CM | POA: Diagnosis present

## 2013-05-29 HISTORY — DX: Unspecified osteoarthritis, unspecified site: M19.90

## 2013-05-29 HISTORY — DX: Cocaine abuse, in remission: F14.11

## 2013-05-29 LAB — CBC
HCT: 43.8 % (ref 39.0–52.0)
Hemoglobin: 15.3 g/dL (ref 13.0–17.0)
MCHC: 34.9 g/dL (ref 30.0–36.0)
MCV: 83.1 fL (ref 78.0–100.0)
RDW: 13 % (ref 11.5–15.5)

## 2013-05-29 LAB — TROPONIN I: Troponin I: <0.3 ng/mL (ref <0.30)

## 2013-05-29 LAB — COMPREHENSIVE METABOLIC PANEL
Albumin: 3.7 g/dL (ref 3.5–5.2)
Alkaline Phosphatase: 75 U/L (ref 39–117)
BUN: 12 mg/dL (ref 6–23)
Chloride: 105 mEq/L (ref 96–112)
Creatinine, Ser: 0.92 mg/dL (ref 0.50–1.35)
GFR calc Af Amer: >90 mL/min (ref >90)
Glucose, Bld: 89 mg/dL (ref 70–99)
Total Bilirubin: 0.6 mg/dL (ref 0.3–1.2)

## 2013-05-29 LAB — URINALYSIS, ROUTINE W REFLEX MICROSCOPIC
Glucose, UA: NEGATIVE mg/dL
Ketones, ur: NEGATIVE mg/dL
Leukocytes, UA: NEGATIVE
Nitrite: NEGATIVE
Protein, ur: NEGATIVE mg/dL
Urobilinogen, UA: 1 mg/dL (ref 0.0–1.0)

## 2013-05-29 LAB — LIPID PANEL
Cholesterol: 217 mg/dL — ABNORMAL HIGH (ref 0–200)
HDL: 70 mg/dL (ref >39)
LDL Cholesterol: 131 mg/dL — ABNORMAL HIGH (ref 0–99)
Triglycerides: 79 mg/dL (ref <150)
VLDL: 16 mg/dL (ref 0–40)

## 2013-05-29 LAB — GLUCOSE, CAPILLARY: Glucose-Capillary: 74 mg/dL (ref 70–99)

## 2013-05-29 LAB — RAPID URINE DRUG SCREEN, HOSP PERFORMED
Amphetamines: NOT DETECTED
Opiates: NOT DETECTED

## 2013-05-29 LAB — BASIC METABOLIC PANEL
BUN: 11 mg/dL (ref 6–23)
CO2: 28 mEq/L (ref 19–32)
Calcium: 9.2 mg/dL (ref 8.4–10.5)
GFR calc non Af Amer: 89 mL/min — ABNORMAL LOW (ref >90)
Glucose, Bld: 83 mg/dL (ref 70–99)

## 2013-05-29 MED ORDER — ENOXAPARIN SODIUM 40 MG/0.4ML ~~LOC~~ SOLN
40.0000 mg | SUBCUTANEOUS | Status: DC
  Administered 2013-05-29: 40 mg via SUBCUTANEOUS
  Filled 2013-05-29 (×2): qty 0.4

## 2013-05-29 MED ORDER — SODIUM CHLORIDE 0.9 % IJ SOLN
3.0000 mL | Freq: Two times a day (BID) | INTRAMUSCULAR | Status: DC
  Administered 2013-05-29: 3 mL via INTRAVENOUS

## 2013-05-29 NOTE — Progress Notes (Signed)
Unit CM UR Completed by [facility] ED CM  W. Leoncio Hansen RN  

## 2013-05-29 NOTE — ED Notes (Signed)
Heart healthy diet

## 2013-05-29 NOTE — Progress Notes (Signed)
Addendum 05/29/13 20:09 W. Aundria Rud RN  ED CM noted not to have PCP or health insurance.  Pt presents to ED with Syncope.  In room to speak with patient. He states, that he does not have a PCP, or insurance at this time.  Pt given a Marketing executive with list of Free and Affordable PCP's, MAP,   and other services in [place].  Pt made aware of the [facility] and information regarding the [facility]).  Pt instructed on how to F/U with a  PCP after discharge.  Pt verbalized understanding.  Pt admitted to 3[facility]  OBS,  CM will continue to follow.

## 2013-05-29 NOTE — ED Provider Notes (Signed)
History    CSN: [id number] Arrival date & time 05/29/13  1232  First MD Initiated Contact with Patient 05/29/13 1233     CC: chest pain  (Consider location/radiation/quality/duration/timing/severity/associated sxs/prior Treatment) HPI Comments: Patient with history of left knee arthrocentesis, cocaine use, no routine primary care followup and no known history of hypertension, hypercholesterolemia, diabetes -- presents with complaint of near syncopal episodes that is been having over the past 2 months that has been becoming more frequent. Episodes have been while at rest and also while he has been riding a bike and exercising.  He has not had full syncope. Patient experiences a sensation of lightheadedness followed by left chest discomfort, shortness of breath that lasts for several hours before gradually resolving. Last episodes were yesterday and this morning. Patient denies fever, nausea, or vomiting. No previous cardiac workup. 324mg  aspirin was given prior to arrival. No family history of coronary artery disease. Onset of symptoms acute. Course is resolved. Nothing makes symptoms better or worse.  The history is provided by the patient.   History reviewed. No pertinent past medical history. Past Surgical History  Procedure Laterality Date  . Joint replacement     History reviewed. No pertinent family history. History  Substance Use Topics  . Smoking status: Never Smoker   . Smokeless tobacco: Not on file  . Alcohol Use: No    Review of Systems  Constitutional: Positive for fatigue. Negative for fever and diaphoresis.  HENT: Negative for neck pain.   Eyes: Negative for redness.  Respiratory: Positive for shortness of breath. Negative for cough.   Cardiovascular: Positive for chest pain. Negative for palpitations and leg swelling.  Gastrointestinal: Negative for nausea, vomiting and abdominal pain.  Genitourinary: Negative for dysuria.  Musculoskeletal: Negative for back pain.   Skin: Negative for rash.  Neurological: Positive for syncope (near-syncope) and light-headedness. Negative for weakness.    Allergies  Review of patient's allergies indicates no known allergies.  Home Medications   Current Outpatient Rx  Name  Route  Sig  Dispense  Refill  . Aloe-Sodium Chloride (AYR SALINE NASAL GEL NA)   Nasal   Place 2 puffs into the nose daily as needed. Nasal congestion         . OVER THE COUNTER MEDICATION   Oral   Take 2 tablets by mouth daily. Emergen-C dissolvable tablets.          BP 141/101  Pulse 74  Temp(Src) 98.3 F (36.8 C) (Oral)  Resp 17  Ht 6\' 4"  (1.93 m)  Wt 223 lb (101.152 kg)  BMI 27.16 kg/m2  SpO2 100% Physical Exam  Nursing note and vitals reviewed. Constitutional: He appears well-developed and well-nourished.  HENT:  Head: Normocephalic and atraumatic.  Mouth/Throat: Mucous membranes are normal. Mucous membranes are not dry.  Eyes: Conjunctivae are normal.  Neck: Trachea normal and normal range of motion. Neck supple. Normal carotid pulses and no JVD present. No muscular tenderness present. Carotid bruit is not present. No tracheal deviation present.  Cardiovascular: Normal rate, regular rhythm, S1 normal, S2 normal, normal heart sounds and intact distal pulses.  Exam reveals no distant heart sounds and no decreased pulses.   No murmur heard. Pulmonary/Chest: Effort normal and breath sounds normal. No respiratory distress. He has no wheezes. He exhibits no tenderness.  Abdominal: Soft. Normal aorta and bowel sounds are normal. There is no tenderness. There is no rebound and no guarding.  Musculoskeletal: He exhibits no edema.  Neurological: He is alert.  Skin: Skin is warm and dry. He is not diaphoretic. No cyanosis. No pallor.  Psychiatric: He has a normal mood and affect.    ED Course  Procedures (including critical care time) Labs Reviewed  COMPREHENSIVE METABOLIC PANEL - Abnormal; Notable for the following:     Potassium 5.2 (*)    GFR calc non Af Amer 89 (*)    All other components within normal limits  CBC  URINALYSIS, ROUTINE W REFLEX MICROSCOPIC  URINE RAPID DRUG SCREEN (HOSP PERFORMED)  GLUCOSE, CAPILLARY  TROPONIN I   Dg Chest 2 View  05/29/2013   *RADIOLOGY REPORT*  Clinical Data: Chest pain.  Near syncope.  CHEST - 2 VIEW  Comparison: 09/29/2012  Findings: The heart size and pulmonary vascularity are normal and the lungs are clear.  No significant osseous abnormality.  IMPRESSION: No acute disease.   Original Report Authenticated By: Francene Boyers, M.D.   1. Chest pain   2. Near syncope     12:45 PM Patient seen and examined. Work-up initiated. Medications ordered.   Vital signs reviewed and are as follows: Filed Vitals:   05/29/13 1234  BP: 141/101  Pulse: 74  Temp: 98.3 F (36.8 C)  Resp: 17    Date: 05/29/2013  Rate: 73  Rhythm: normal sinus rhythm  QRS Axis: normal  Intervals: PR prolonged  ST/T Wave abnormalities: normal  Conduction Disutrbances:first-degree A-V block   Narrative Interpretation:   Old EKG Reviewed: new 1st degree block, resolution of tachycardia since  Pt d/w Dr. Rubin Payor.   Pt informed of results and recommendation for admission. He agrees.   Spoke with [facility] who will see and admit.    MDM  Admit, multiple episodes CP/near syncope, becoming more frequent, in patient with unclear risk factor profile.     Renne Crigler, PA-C 05/29/13 1546

## 2013-05-29 NOTE — ED Notes (Signed)
Pt presents with multiple episodes of dizziness and near syncope.  Pt reports episodes are associated with L sided chest pain and shortness of breath.  Pt took 324mg  ASA PTA.

## 2013-05-29 NOTE — ED Notes (Addendum)
Pt's CBG was 74 when I checked it.12:48 pm JG.I also filled out an edit sheet and sent it down.12:48 pm JG.

## 2013-05-29 NOTE — H&P (Signed)
Date: 05/29/2013               Patient Name:  Marc [name] MRN: [id number]  DOB: Jun 01, 1951 Age / Sex: 62 y.o., male   PCP: No Pcp Per Patient         Medical Service: Internal Medicine Teaching Service         Attending Physician: Dr. Criselda Peaches    First Contact: Dr. Mariea Clonts Pager: [phone]  Second Contact: Dr. Everardo Beals Pager: [phone]       After Hours (After 5p/  First Contact Pager: [phone]  weekends / holidays): Second Contact Pager: [phone]   Chief Complaint: Near Syncope  History of Present Illness:  Marc  Marc [name] is a 62 yo M with history of cocaine abuse who presents to [facility] ED on 05/29/13 for further evaluation of dizziness, patients has had 5 episodes of dizziness over the past three months. Initial episode was when he was standing in church, it was sudden, no nausea, vomiting,diaphoresis or numbness, no SOB, no leg swelling or headaches before and during episodes. But he does report that he noticed his hands were clammy. No hx of similar occurences in the past. Since then he has 4 more episodes, while riding his bicycle or working out, and similar in presentation. Last two episodes was last night when he was about to sleep and this morning (6:45a) while brushing his teeth. He also reports some accompanying vague left sided chest discomfort, which he described as a slight heaviness, presented after 2 of the dizzy episodes, for about 1 hr, and subsided spontaneously, also this has long preceeded the hx of dizziness, patient cant say specifically, but its more than a year.  There is a hx of cocaine use, last use was last Nov, and before that was over 5 yrs ago, also a positive hx of ringing in his ear. No hx of heart dx, diabetes, hypertension, not on any medications, no hx of dark or bloody stools, no prolonged immobilization, last smoked cig 30 years ago for about a year.  Review of Systems: Constitutional: Denies fever, chills, appetite change and fatigue.  HEENT: Denies photophobia,  eye pain, redness, hearing loss, ear pain,sinus congestion Respiratory: Denies SOB, DOE, cough, chest tightness, and wheezing.  Gastrointestinal: Denies nausea, vomiting, abdominal pain, diarrhea, constipation,blood in stool.  Genitourinary: Denies dysuria, urgency, frequency, hematuria, and difficulty urinating.  Musculoskeletal: Denies myalgias, back pain, joint swelling, arthralgias and gait problem.   Meds:  No home medications  Allergies: Allergies as of 05/29/2013  . (No Known Allergies)   Past Medical History  Diagnosis Date  . Osteoarthritis   . History of cocaine abuse     Past Surgical History  Procedure Laterality Date  . Knee arthroplasty Left 06/21/11    Ollen Gross, MD   Family History  Problem Relation Age of Onset  . Stroke Mother 56    History   Social History  . Marital Status: Single    Spouse Name: N/A    Number of Children: None  . Years of Education: N/A   Occupational History  . Not on file; unemployed for the past 5 years. Previously a Investment banker, corporate.   Social History Main Topics  . Smoking status: Last smoked cigarettes 30 years ago for 1 year.  . Smokeless tobacco: Not on file  . Alcohol Use: No  . Drug Use: Yes    Special: Cocaine  . Sexually Active:    Other Topics Concern  . Not on file  Social History Narrative  . No narrative on file    Physical Exam: Blood pressure 134/89, pulse 67, temperature 98.3 F (36.8 C), temperature source Oral, resp. rate 18, height 6\' 4"  (1.93 m), weight 223 lb (101.152 kg), SpO2 99.00%. BP 120/92  Pulse 87  Temp(Src) 98.1 F (36.7 C) (Oral)  Resp 21  Ht 6\' 4"  (1.93 m)  Wt 101.152 kg (223 lb)  BMI 27.16 kg/m2  SpO2 100% Left: 142/101 Right 134/100 Supine  120/88 68  sitting 127/91 68  standing 120/92 87    General Appearance:    Alert, cooperative, no distress, appears stated age  Head:    Normocephalic, without obvious abnormality, atraumatic  Eyes:    PERRL, conjunctiva/corneas  clear, EOM's intact   Ears:    Normal TM's and external ear canals, both ears  Throat:   Lips, mucosa, and tongue normal; teeth and gums normal  Neck:   Supple, symmetrical, trachea midline, no adenopathy;       thyroid:  No enlargement/tenderness/nodules; no carotid   Bruit.  Back:     Symmetric, no curvature, ROM normal, no CVA tenderness  Lungs:     Clear to auscultation bilaterally, respirations unlabored  Chest wall:    No tenderness or deformity  Heart:    Regular rate and rhythm, S1 and S2 normal, no murmur, rub   or gallop  Abdomen:     Soft, non-tender, bowel sounds active all four quadrants,    no masses.  Extremities:   Extremities normal, atraumatic, no cyanosis or edema  Pulses:   2+ and symmetric all extremities  Skin:   Skin color, texture, turgor normal, no rashes or lesions  Lymph nodes:   Cervical, supraclavicular, and normal  Neurologic:   CNII-XII intact. Normal strength, sensation and reflexes      Throughout,finger to nose test normal,can perform rapidly alternating movement,normal heel to shin test, gait normal, neg Romberg    Lab results: Basic Metabolic Panel:  Recent Labs  96/04/54 1312  NA 138  K 5.2*  CL 105  CO2 25  GLUCOSE 89  BUN 12  CREATININE 0.92  CALCIUM 9.0  Anion gap- 8  Liver Function Tests:  Recent Labs  05/29/13 1312  AST 23  ALT 16  ALKPHOS 75  BILITOT 0.6  PROT 7.5  ALBUMIN 3.7   CBC:  Recent Labs  05/29/13 1312  WBC 5.9  HGB 15.3  HCT 43.8  MCV 83.1  PLT 159   Cardiac Enzymes:  Recent Labs  05/29/13 1312  TROPONINI <0.30   CBG:  Recent Labs  05/29/13 1239  GLUCAP 74   Urine Drug Screen: Drugs of Abuse     Component Value Date/Time   LABOPIA NONE DETECTED 05/29/2013 1359   COCAINSCRNUR NONE DETECTED 05/29/2013 1359   LABBENZ NONE DETECTED 05/29/2013 1359   AMPHETMU NONE DETECTED 05/29/2013 1359   THCU NONE DETECTED 05/29/2013 1359   LABBARB NONE DETECTED 05/29/2013 1359    Urinalysis:  Recent Labs   05/29/13 1359  COLORURINE YELLOW  LABSPEC 1.008  PHURINE 7.0  GLUCOSEU NEGATIVE  HGBUR NEGATIVE  BILIRUBINUR NEGATIVE  KETONESUR NEGATIVE  PROTEINUR NEGATIVE  UROBILINOGEN 1.0  NITRITE NEGATIVE  LEUKOCYTESUR NEGATIVE    Imaging results:  Dg Chest 2 View  05/29/2013   *RADIOLOGY REPORT*  Clinical Data: Chest pain.  Near syncope.  CHEST - 2 VIEW  Comparison: 09/29/2012  Findings: The heart size and pulmonary vascularity are normal and the lungs are clear.  No significant  osseous abnormality.  IMPRESSION: No acute disease.   Original Report Authenticated By: Francene Boyers, M.D.    Other results: EKG: HR 73, normal axis, sinus rhythm with LAA, ? Very small ST elevation in V3-V6  Assessment & Plan by Problem: Principal Problem:   Chest pain Active Problems:   History of cocaine abuse   Near syncope   # Near Syncope- Differentials are as follows: Arrthymias (episodic as symptoms are sudden, no precipitating factors, hx of arrthymias and EKG show no significant abnormalities presently, but Electrolytes are normal except for a K -5.2), Carotid Stenosis (though the absence of carotid bruit and absence of makes this unlikely), Acute coronary syndrome (history of mild chest discomfort, though vague, with questionable EKG changes. 1st set of enz- Negative, and this was done at least six hrs after episode of dizziness today), aortic stenosis (though no murmur on exam), Mennieres Disease (dizziness with tinnitus). Post circulation stroke was considered but less likely given that he is currently asymptomatic with normal cerebellar exam). Finally, given that he has no medical follow up, cannot r/o diabetic neuropathy as contributing to autonomic instability.  Orthostatic VS negative. -Admit to observation on Telemetry -TSH - Carotid dopplers -Cardiac Enz x2 -Repeat EKG -Echocardiogram - Vestibular rehab. -HbA1C - Lipid profile  #VTE ppx: lovenox  #Code status: full  Dispo: Disposition is  deferred at this time, awaiting improvement of current medical problems. Anticipated discharge in approximately 1-2 day(s).   The patient does not have a current PCP (No Pcp Per Patient) and may need an [facility] hospital follow-up appointment after discharge.  The patient does not have transportation limitations that hinder transportation to clinic appointments.  Signed: Belia Heman, MD 05/29/2013, 4:14 PM

## 2013-05-30 DIAGNOSIS — I369 Nonrheumatic tricuspid valve disorder, unspecified: Secondary | ICD-10-CM

## 2013-05-30 LAB — HEMOGLOBIN A1C: Hgb A1c MFr Bld: 5.1 % (ref <5.7)

## 2013-05-30 NOTE — Progress Notes (Signed)
Physical Therapy Evaluation and D/C Patient Details Name: Marc [name] MRN: [id number] DOB: 10-02-1951 Today's Date: 05/30/2013 Time: 4098-1191 PT Time Calculation (min): 25 min  PT Assessment / Plan / Recommendation History of Present Illness  Pt with near syncope and dizziness.  Has had multiple episodes over last few months.  Clinical Impression  Could not elicit dizziness with testing.  Symptoms appear to have resolved.  Noted a lag in movement in left eye therefore initiated eye exercises.  Instructed pt to f/u with Outpt PT if needed if symptoms persist.      PT Assessment  Patent does not need any further PT services    Follow Up Recommendations  Outpatient PT;Supervision - Intermittent;Other (comment) (Can f/u at Outpt PT for vestibular rehab if vertigo returns)                Equipment Recommendations  None recommended by PT         Frequency   N/a   Precautions / Restrictions Precautions Precautions: None Restrictions Weight Bearing Restrictions: No   Pertinent Vitals/Pain VSS, no pain      Mobility  Bed Mobility Bed Mobility: Supine to Sit Supine to Sit: 7: Independent Details for Bed Mobility Assistance: Tested for BPPV with negative results.  Could not elicit any dizziness.  Did note that left eye appears to not keep up with gaze staibility therefore instructed pt to perform x1 and x2 exercises.   Transfers Transfers: Sit to Stand;Stand to Sit Sit to Stand: 7: Independent Stand to Sit: 7: Independent Ambulation/Gait Ambulation/Gait Assistance: 7: Independent Ambulation Distance (Feet): 500 Feet Assistive device: None Ambulation/Gait Assistance Details: Pt ambulates and can accept challenges to balance. Gait Pattern: Within Functional Limits Stairs: No Wheelchair Mobility Wheelchair Mobility: No      PT Goals(Current goals can be found in the care plan section)  N/A  Visit Information  Last PT Received On: 05/30/13 Assistance Needed:  +1 History of Present Illness: Pt with near syncope and dizziness.  Has had multiple episodes over last few months.       Prior Functioning  Home Living Family/patient expects to be discharged to:: Private residence Living Arrangements: Parent Available Help at Discharge: Available 24 hours/day Type of Home: House Home Access: Stairs to enter Secretary/administrator of Steps: 2 Entrance Stairs-Rails: None Home Layout: One level Home Equipment: None Prior Function Level of Independence: Independent Communication Communication: No difficulties    Cognition  Cognition Arousal/Alertness: Awake/alert Behavior During Therapy: WFL for tasks assessed/performed Overall Cognitive Status: Within Functional Limits for tasks assessed    Extremity/Trunk Assessment Upper Extremity Assessment Upper Extremity Assessment: Defer to OT evaluation Lower Extremity Assessment Lower Extremity Assessment: Overall WFL for tasks assessed Cervical / Trunk Assessment Cervical / Trunk Assessment: Normal   Balance Standardized Balance Assessment Standardized Balance Assessment: Dynamic Gait Index Dynamic Gait Index Level Surface: Normal Change in Gait Speed: Normal Gait with Horizontal Head Turns: Normal Gait with Vertical Head Turns: Normal Gait and Pivot Turn: Normal Step Over Obstacle: Normal Step Around Obstacles: Normal Steps: Normal Total Score: 24  End of Session PT - End of Session Equipment Utilized During Treatment: Gait belt Activity Tolerance: Patient tolerated treatment well Patient left: in bed;with call bell/phone within reach Nurse Communication: Mobility status  GP Functional Assessment Tool Used: clinical judgment Functional Limitation: Mobility: Walking and moving around Mobility: Walking and Moving Around Current Status (Y7829): 0 percent impaired, limited or restricted Mobility: Walking and Moving Around Goal Status (F6213): 0 percent impaired, limited  or  restricted Mobility: Walking and Moving Around Discharge Status [phone]): 0 percent impaired, limited or restricted   INGOLD,Darrick Greenlaw 05/30/2013, 11:58 AM [facility] [phone] (pager)

## 2013-05-30 NOTE — Progress Notes (Signed)
Subjective: No complaints today.  Objective: Vital signs in last 24 hours: Filed Vitals:   05/29/13 1800 05/29/13 1845 05/29/13 1859 05/29/13 2007  BP: 140/98 116/97 116/97 125/94  Pulse: 66 72  67  Temp:    98.1 F (36.7 C)  TempSrc:    Oral  Resp: 18 19 18 18   Height:    6\' 4"  (1.93 m)  Weight:    101.152 kg (223 lb)  SpO2: 100% 100% 98% 100%   Weight change:  No intake or output data in the 24 hours ending 05/30/13 4696 General appearance: alert and cooperative, well developed, well nourished man, apperas younger than stated age. Head: Normocephalic, without obvious abnormality, atraumatic Throat: lips, mucosa, and tongue normal; teeth and gums normal Chest wall: no tenderness Heart: regular rate and rhythm, S1, S2 normal, no murmur, click, rub or gallop Abdomen: soft, non-tender; bowel sounds normal; no masses,  no organomegaly and Bowel sounds present.  Lab Results: Basic Metabolic Panel:  Recent Labs Lab 05/29/13 1312 05/29/13 2103  NA 138 139  K 5.2* 3.9  CL 105 103  CO2 25 28  GLUCOSE 89 83  BUN 12 11  CREATININE 0.92 0.90  CALCIUM 9.0 9.2   Liver Function Tests:  Recent Labs Lab 05/29/13 1312  AST 23  ALT 16  ALKPHOS 75  BILITOT 0.6  PROT 7.5  ALBUMIN 3.7   CBC:  Recent Labs Lab 05/29/13 1312  WBC 5.9  HGB 15.3  HCT 43.8  MCV 83.1  PLT 159   Cardiac Enzymes:  Recent Labs Lab 05/29/13 1312 05/29/13 2104 05/30/13 0248  TROPONINI <0.30 <0.30 <0.30   CBG:  Recent Labs Lab 05/29/13 1239  GLUCAP 74   Hemoglobin A1C:  Recent Labs Lab 05/29/13 2103  HGBA1C 5.1   Fasting Lipid Panel:  Recent Labs Lab 05/29/13 2104  CHOL 217*  HDL 70  LDLCALC 131*  TRIG 79  CHOLHDL 3.1   Urine Drug Screen: Drugs of Abuse     Component Value Date/Time   LABOPIA NONE DETECTED 05/29/2013 1359   COCAINSCRNUR NONE DETECTED 05/29/2013 1359   LABBENZ NONE DETECTED 05/29/2013 1359   AMPHETMU NONE DETECTED 05/29/2013 1359   THCU NONE  DETECTED 05/29/2013 1359   LABBARB NONE DETECTED 05/29/2013 1359    Urinalysis:  Recent Labs Lab 05/29/13 1359  COLORURINE YELLOW  LABSPEC 1.008  PHURINE 7.0  GLUCOSEU NEGATIVE  HGBUR NEGATIVE  BILIRUBINUR NEGATIVE  KETONESUR NEGATIVE  PROTEINUR NEGATIVE  UROBILINOGEN 1.0  NITRITE NEGATIVE  LEUKOCYTESUR NEGATIVE   Studies/Results: Dg Chest 2 View  05/29/2013   *RADIOLOGY REPORT*  Clinical Data: Chest pain.  Near syncope.  CHEST - 2 VIEW  Comparison: 09/29/2012  Findings: The heart size and pulmonary vascularity are normal and the lungs are clear.  No significant osseous abnormality.  IMPRESSION: No acute disease.   Original Report Authenticated By: Francene Boyers, M.D.   Medications: I have reviewed the patient's current medications. Scheduled Meds: . enoxaparin (LOVENOX) injection  40 mg Subcutaneous Q24H  . sodium chloride  3 mL Intravenous Q12H   Continuous Infusions:  PRN Meds:. Assessment/Plan: Principal Problem:   Near syncope Active Problems:   History of cocaine abuse  # Near syncope: Asymptomatic since admission.  ACS ruled out. Most likely arrythmia that has not been captured here (thought review of telemetry did show 2 episodes of HR to 120s) and EKG with mild PR prolongation. Repeat EKG with PVC.  Echo done, read pending to evaluate for HF vs  AS (both less likely).  - Event monitor- in case of episodic/paroxysmal arrhythmias. (Discussed with Dr. Tenny Craw of [facility] cardiology)  #VTE ppx: lovenox   Dispo: Anticipate discharge today      LOS: 1 day   Kennis Carina, MD 05/30/2013, 6:32 AM

## 2013-05-30 NOTE — Progress Notes (Signed)
  Echocardiogram 2D Echocardiogram has been performed.  Zola Runion FRANCES 05/30/2013, 10:32 AM

## 2013-05-30 NOTE — ED Provider Notes (Signed)
Medical screening examination/treatment/procedure(s) were performed by non-physician practitioner and as supervising physician I was immediately available for consultation/collaboration.  Karma Hiney R. Rubena Roseman, MD 05/30/13 1651 

## 2013-05-30 NOTE — H&P (Signed)
   I saw and evaluated the patient. I reviewed the resident's note and confirmed the resident's findings. I agree with the assessment and plan as documented in the resident's note.  Briefly, Marc [name] is a 62yo man with PMH of only cocaine abuse (last use in 2013 per patient) who presented to the [facility] ED for evaluation of dizziness.  Marc [name] reported that the dizziness started in the past 3 months, first at church.  He notes no prodromal symptoms, but all of a sudden will get lightheaded and need to sit down.  This has further happened twice on his bike and twice at home.  When he sits to rest, his symptoms are better, but do not completely resolve for a few hours.  He has no history of heart disease, no family history of heart disease, no recent illness.  He denies palpitations during the event, but maybe has some chest pain afterwards.  During the episodes he does not experience any jerking movements, paresthesia or paralysis on one side of the body or the other.  He is on no home medications.  He feels that the episodes are getting more frequent.    On exam, he is a well developed well nourished gentleman, looking younger than stated age.  He has no appreciable murmur or carotid bruit.  His lungs are clear and he has no edema.   Marc [name]' main issues is near syncope.  Possible causes would be neurologic (no seizure like activity, no confusion afterwards), neurocardiogenic or cardiologic.  He has been ruled out for an acute MI.  He does have mild PR prolongation on EKG (repeat pending).  On telemetry, he has had a few episodes of tachycardia that are normal sinus.  I think the most concerning etiology currently is an arrythmia.  We are getting a TTE to look for any structural heart disease and then the next step would be setting him with an event monitor.  The resident team will arrange for this to happen.   Further discussion per resident note.

## 2013-05-30 NOTE — Discharge Summary (Signed)
Name: Marc [name] MRN: [id number] DOB: October 23, 1951 62 y.o. PCP: No Pcp Per Patient Contact- cell phone number- [phone]                -Home- [phone]  Date of Admission: 05/29/2013 12:32 PM Date of Discharge: 05/30/2013 Attending Physician: No att. providers found  Discharge Diagnosis: Principal Problem:   Near syncope Active Problems:   History of cocaine abuse   Disposition and follow-up:   Mr.Marc [name] was discharged from [facility] in Good condition.  At the hospital follow up visit please address:  1.  Cardiac monitor Placement and review TTE results.  2.  Re- evaluate symptoms of near syncope.  Follow-up Appointments:     Follow-up Information   Follow up with Internal medicine center. Schedule an appointment as soon as possible for a visit on 06/20/2013.   Contact information:   [address] [place] [address] [phone]      Follow up with [facility] Main [facility]). [facility] cardiology will contact you for an event monitor)    Contact information:   [address] [place] [address] [phone]      Discharge Instructions: Discharge Orders   Future Orders Complete By Expires     Call MD for:  persistant dizziness or light-headedness  As directed     Diet - low sodium heart healthy  As directed     Discharge instructions  As directed     Comments:      We are not certain why you are having this episodes of dizziness, you are not having a heart attack, but we need to use a cardiac monitor to monitor your heart activity while you are doing your normal daily activities.    Increase activity slowly  As directed        Consultations:  None  Procedures Performed:  Dg Chest 2 View 05/29/2013   *RADIOLOGY REPORT*  Clinical Data: Chest pain.  Near syncope.  CHEST - 2 VIEW  Comparison: 09/29/2012  Findings: The heart size and pulmonary vascularity are normal and the lungs are clear.  No significant  osseous abnormality.  IMPRESSION: No acute disease.      2D Echo: Pending.   Admission HPI: Chief Complaint: Near Syncope  History of Present Illness: [name] is a 62 yo M with history of cocaine abuse who presents to [facility] ED on 05/29/13 for further evaluation of dizziness, patients has had 5 episodes of dizziness over the past three months. Initial episode was when he was standing in church, it was sudden, no nausea, vomiting,diaphoresis or numbness, no SOB, no leg swelling or headaches before and during episodes. But he does report that he noticed his hands were clammy. No hx of similar occurences in the past. Since then he has 4 more episodes, while riding his bicycle or working out, and similar in presentation. Last two episodes was last night when he was about to sleep and this morning (6:45a) while brushing his teeth. He also reports some accompanying vague left sided chest discomfort, which he described as a slight heaviness, presented after 2 of the dizzy episodes, for about 1 hr, and subsided spontaneously, also this has long preceeded the hx of dizziness, patient cant say specifically, but its more than a year.  There is a hx of cocaine use, last use was last Nov, and before that was over 5 yrs ago, also a positive hx of ringing in  his ear.  No hx of heart dx, diabetes, hypertension, not on any medications, no hx of dark or bloody stools, no prolonged immobilization, last smoked cig 30 years ago for about a year.  Review of Systems:  Constitutional: Denies fever, chills, appetite change and fatigue.  HEENT: Denies photophobia, eye pain, redness, hearing loss, ear pain,sinus congestion  Respiratory: Denies SOB, DOE, cough, chest tightness, and wheezing.  Gastrointestinal: Denies nausea, vomiting, abdominal pain, diarrhea, constipation,blood in stool.  Genitourinary: Denies dysuria, urgency, frequency, hematuria, and difficulty urinating.  Musculoskeletal: Denies myalgias, back pain, joint  swelling, arthralgias and gait problem.   Physical Exam-   General Exam-  Well built male, alert, cooperative, no distress, appears younger than stated age. HEENT- Normocephalic, without obvious abnormality, atraumatic PERRL, conjunctiva/corneas clear, EOM's intact Normal TM's and external ear canals, both ears Lips, mucosa, and tongue normal; teeth and gums normal  Supple, symmetrical, trachea midline, no adenopathy;  thyroid: No enlargement/tenderness/nodules; no carotid  Bruit.  Lungs:  Clear to auscultation bilaterally, respirations unlabored  Chest wall:  No tenderness or deformity. Heart: Regular rate and rhythm, S1 and S2 normal, no murmur, rub or gallop  Abdomen: Soft, non-tender, bowel sounds active all four quadrants, no masses. no masses.  Extremities:  Extremities normal, atraumatic, no cyanosis, 2+ and symmetric all extremities  Skin:  Skin color, texture, turgor normal, no rashes or lesions Lymph node: Cervical, supraclavicular, and normal  Neurologic: CNII-XII intact. Normal strength, sensation and reflexes  Throughout,finger to nose test normal,can perform rapidly alternating movement,normal heel to shin test, gait normal, neg Romberg   Lab results:  Basic Metabolic Panel:   Recent Labs   05/29/13 1312   NA  138   K  5.2*   CL  105   CO2  25   GLUCOSE  89   BUN  12   CREATININE  0.92   CALCIUM  9.0   Anion gap- 8  Liver Function Tests:   Recent Labs   05/29/13 1312   AST  23   ALT  16   ALKPHOS  75   BILITOT  0.6   PROT  7.5   ALBUMIN  3.7    CBC:   Recent Labs   05/29/13 1312   WBC  5.9   HGB  15.3   HCT  43.8   MCV  83.1   PLT  159    Cardiac Enzymes:   Recent Labs   05/29/13 1312   TROPONINI  <0.30    CBG:   Recent Labs   05/29/13 1239   GLUCAP  74    Urine Drug Screen:  Drugs of Abuse    Component  Value  Date/Time    LABOPIA  NONE DETECTED  05/29/2013 1359    COCAINSCRNUR  NONE DETECTED  05/29/2013 1359     LABBENZ  NONE DETECTED  05/29/2013 1359    AMPHETMU  NONE DETECTED  05/29/2013 1359    THCU  NONE DETECTED  05/29/2013 1359    LABBARB  NONE DETECTED  05/29/2013 1359    Urinalysis:   Recent Labs   05/29/13 1359   COLORURINE  YELLOW   LABSPEC  1.008   PHURINE  7.0   GLUCOSEU  NEGATIVE   HGBUR  NEGATIVE   BILIRUBINUR  NEGATIVE   KETONESUR  NEGATIVE   PROTEINUR  NEGATIVE   UROBILINOGEN  1.0   NITRITE  NEGATIVE   LEUKOCYTESUR  NEGATIVE      Hospital Course by problem  list: Principal Problem:   Near syncope Active Problems:   History of cocaine abuse   # Near Syncope-  Patient was worked up to R/O a cardiac cause,  Differentials considered were- Arrthymias ( might be episodic, as EKG on arrival in the ED showed no specific ST abnormalities, follow up EKG- Showed PVCs and tele monitoring showed two episodes of tachycardia, so patient will be placed on a cardiac monitor set up with [facility] cardiology( as an out patient) . Acute coronary syndrome (history of mild chest discomfort, though vague, with questionable EKG changes and 3 sets of cardiac enzymes were Negative. Aortic stenosis (though no murmur on exam), was considered, but TTE results could not be obtained before patient was discharged from the hospital, and so should be followed up during his cardiology appointment, with [facility] cardiology. TSH, HBA1C were essentially normal. Lipid profile showed LDL- 131, and total Chol - 217.  Orthostatic VS negative.   Discharge Vitals:   BP 137/89  Pulse 72  Temp(Src) 97.7 F (36.5 C) (Oral)  Resp 18  Ht 6\' 4"  (1.93 m)  Wt 101.128 kg (222 lb 15.2 oz)  BMI 27.15 kg/m2  SpO2 100%  Discharge Labs:  Results for orders placed during the hospital encounter of 05/29/13 (from the past 24 hour(s))  BASIC METABOLIC PANEL     Status: Abnormal   Collection Time    05/29/13  9:03 PM      Result Value Range   Sodium 139  135 - 145 mEq/L   Potassium 3.9  3.5 - 5.1 mEq/L   Chloride 103  96 - 112  mEq/L   CO2 28  19 - 32 mEq/L   Glucose, Bld 83  70 - 99 mg/dL   BUN 11  6 - 23 mg/dL   Creatinine, Ser 1.61  0.50 - 1.35 mg/dL   Calcium 9.2  8.4 - 09.6 mg/dL   GFR calc non Af Amer 89 (*) >90 mL/min   GFR calc Af Amer >90  >90 mL/min  HEMOGLOBIN A1C     Status: None   Collection Time    05/29/13  9:03 PM      Result Value Range   Hemoglobin A1C 5.1  <5.7 %   Mean Plasma Glucose 100  <117 mg/dL  TROPONIN I     Status: None   Collection Time    05/29/13  9:04 PM      Result Value Range   Troponin I <0.30  <0.30 ng/mL  LIPID PANEL     Status: Abnormal   Collection Time    05/29/13  9:04 PM      Result Value Range   Cholesterol 217 (*) 0 - 200 mg/dL   Triglycerides 79  <045 mg/dL   HDL 70  >40 mg/dL   Total CHOL/HDL Ratio 3.1     VLDL 16  0 - 40 mg/dL   LDL Cholesterol 981 (*) 0 - 99 mg/dL  TROPONIN I     Status: None   Collection Time    05/30/13  2:48 AM      Result Value Range   Troponin I <0.30  <0.30 ng/mL    Signed: Kennis Carina, MD 05/30/2013, 3:08 PM   Time Spent on Discharge: 35 minutes

## 2013-05-31 NOTE — Discharge Summary (Signed)
I saw Marc [name] on day of discharge and agree with the documented plan.   The most concerning issue for his near syncopal episodes would be an undetected arrhythmia.  We have given his name to [facility] Cardiology to have him set up with an event monitor and follow up.  He will also follow up one time with our clinic, and if he would like to become our patient he may do so.  We assured that we had proper contact for him prior to his discharge.

## 2014-06-26 IMAGING — CR DG CHEST 2V
2 series · 2 of 2 positions shown · non-contrast
Comparison: 09/29/2012

CLINICAL DATA: Chest pain.  Near syncope.

CHEST - 2 VIEW

[w chest pa]
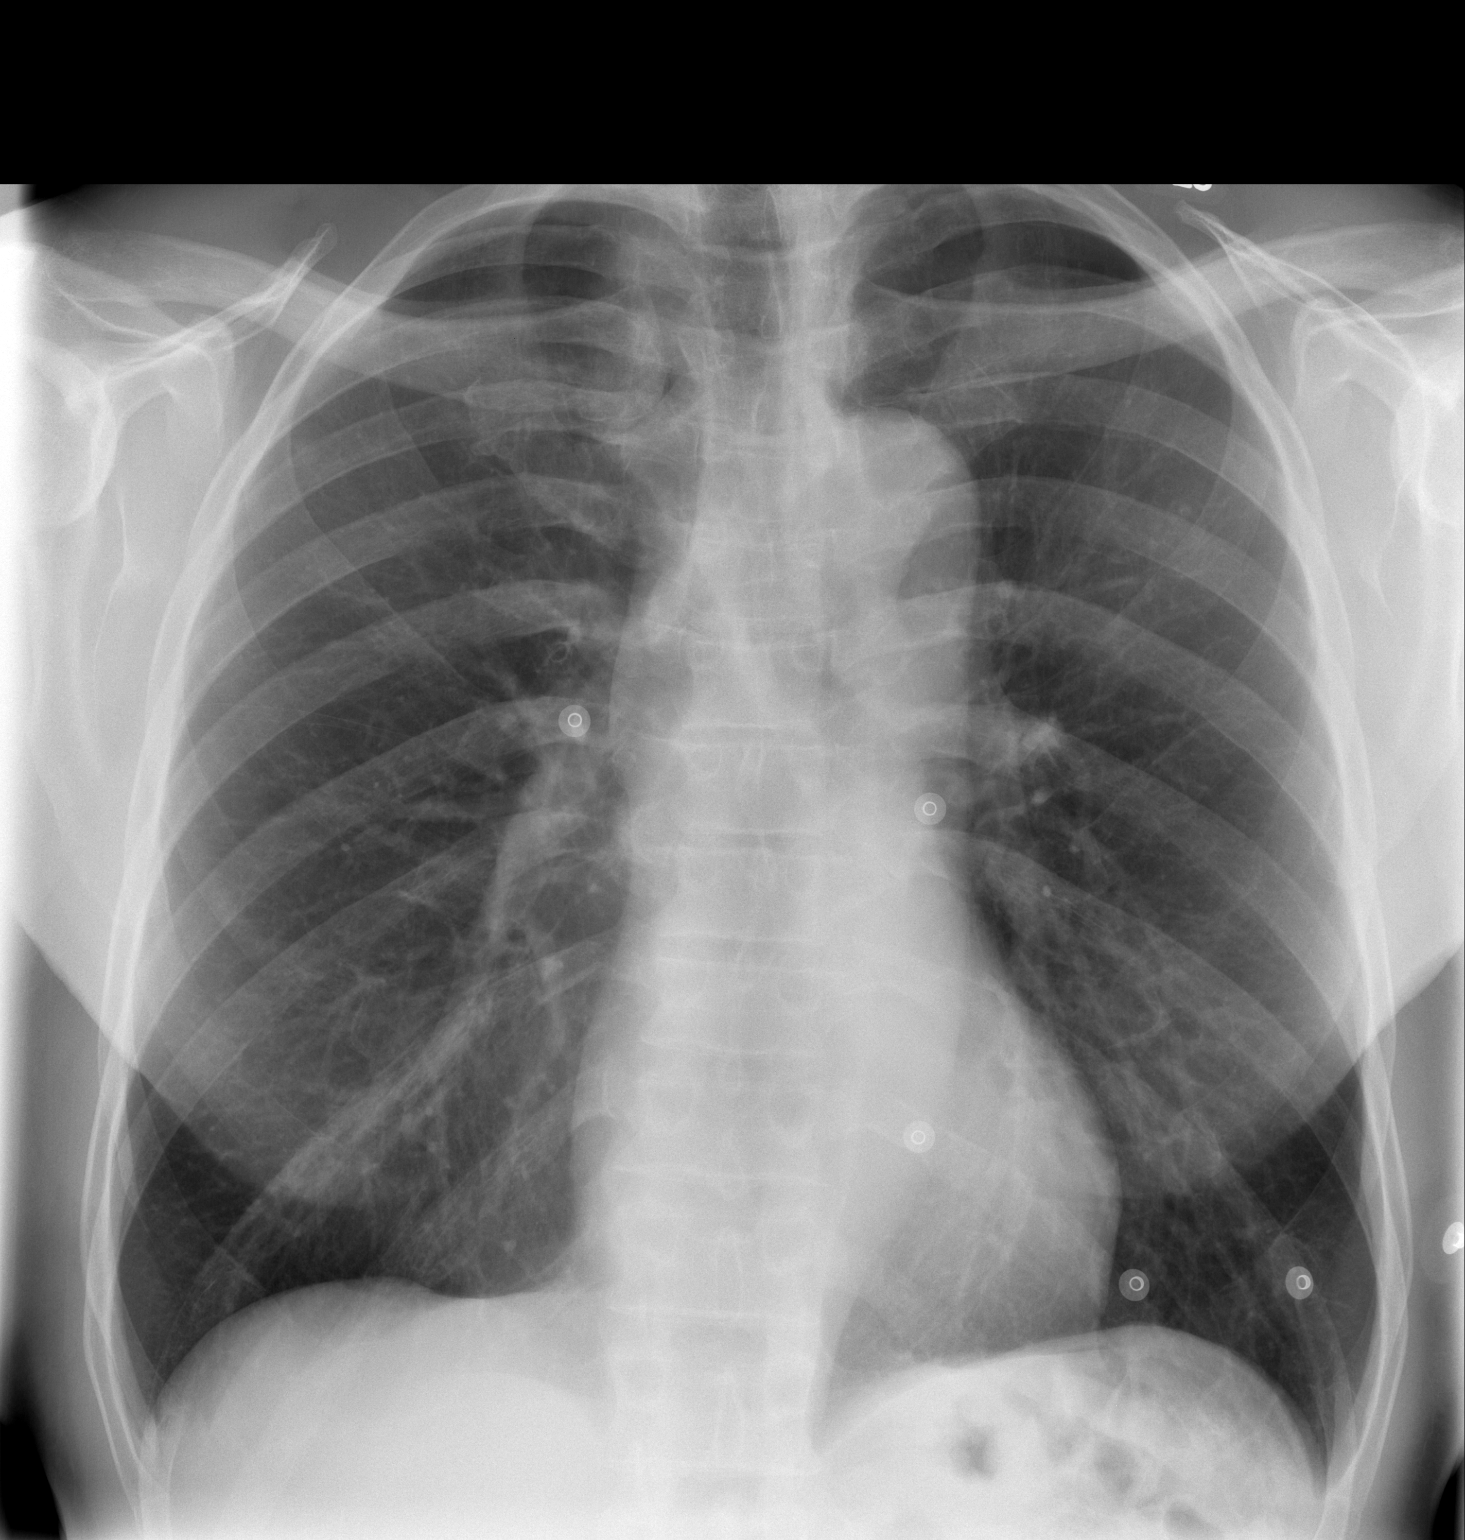

[w chest lat]
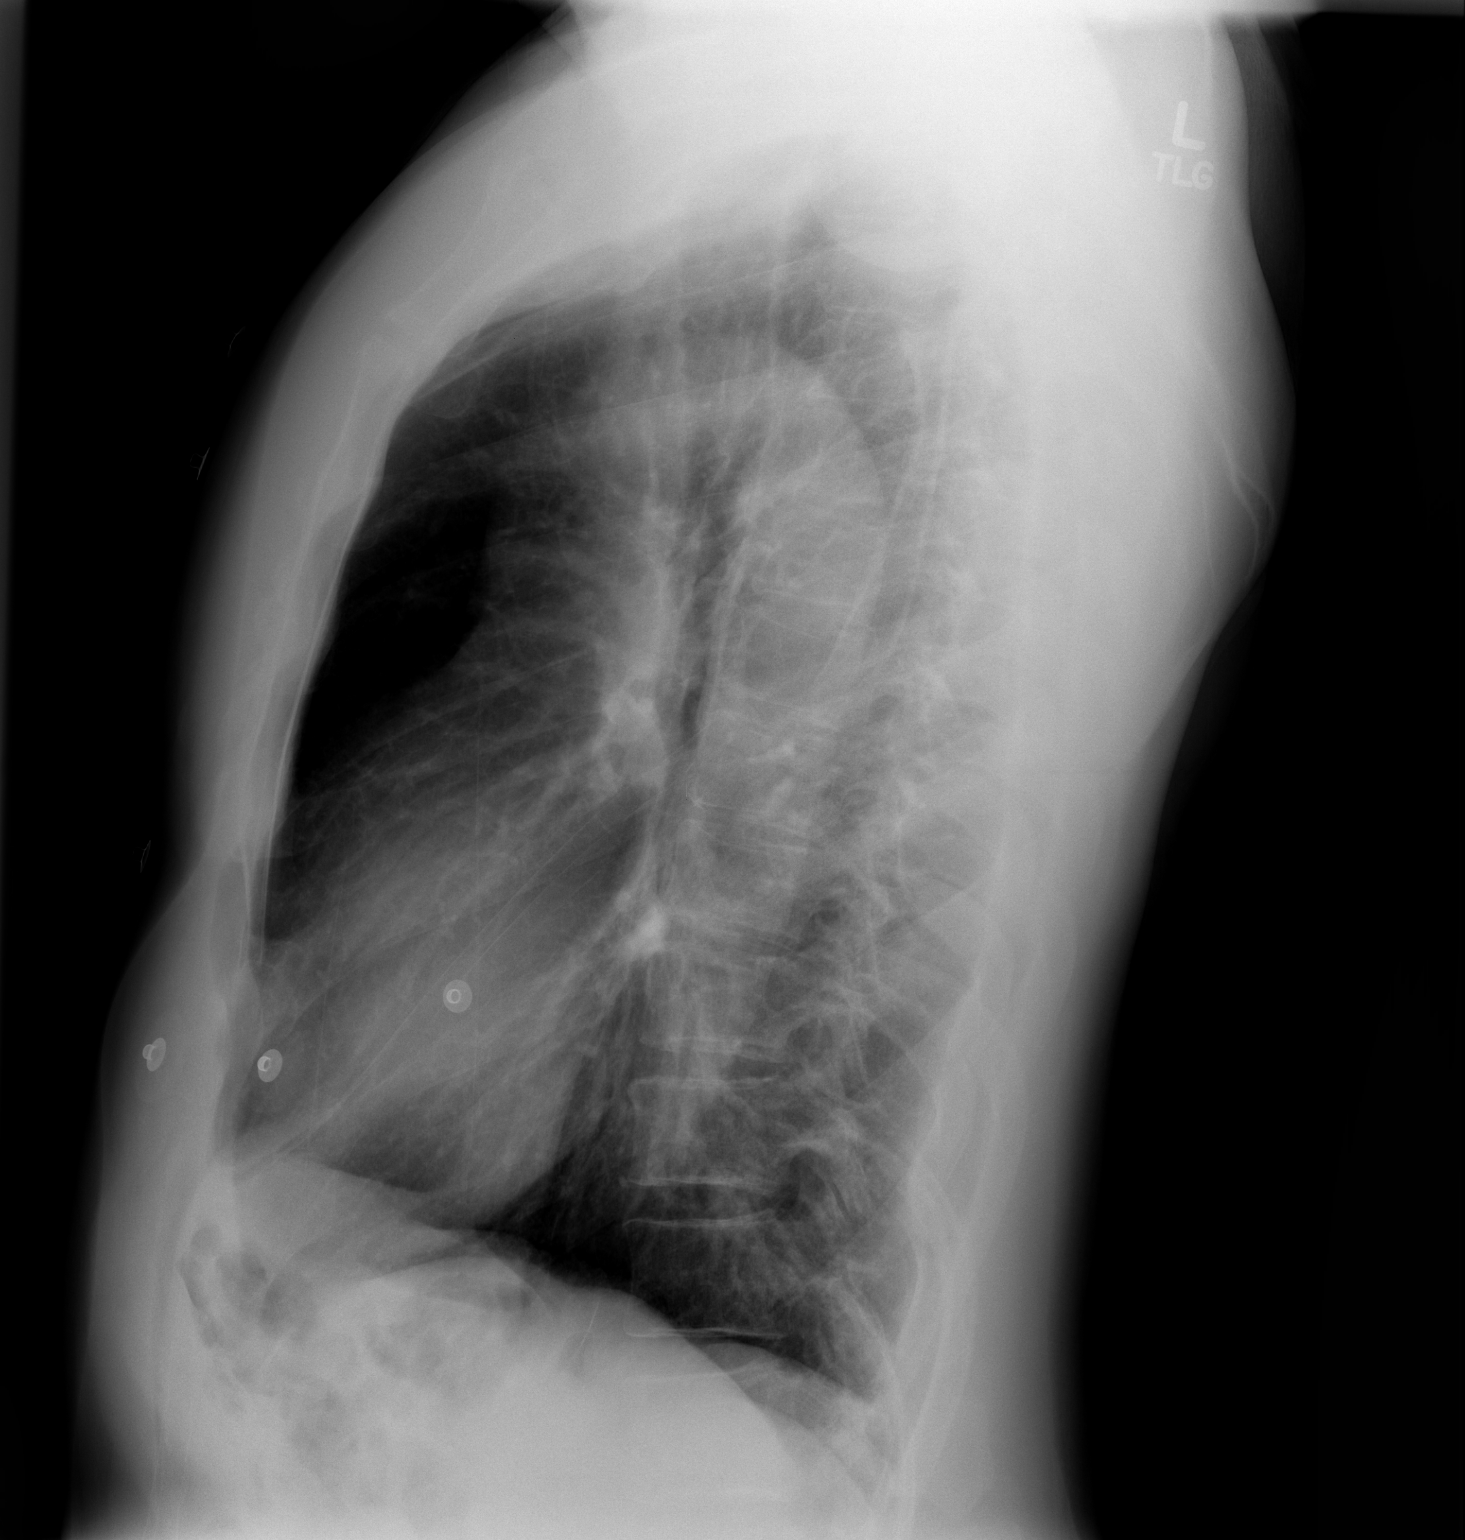

[2 of 2 positions shown; findings below may reference images not displayed]

FINDINGS: The heart size and pulmonary vascularity are normal and
the lungs are clear.  No significant osseous abnormality.
IMPRESSION: No acute disease.

## 2016-03-01 DIAGNOSIS — R030 Elevated blood-pressure reading, without diagnosis of hypertension: Secondary | ICD-10-CM | POA: Diagnosis not present

## 2016-03-01 DIAGNOSIS — R5383 Other fatigue: Secondary | ICD-10-CM | POA: Diagnosis not present

## 2016-03-01 DIAGNOSIS — Z0000 Encounter for general adult medical examination without abnormal findings: Secondary | ICD-10-CM | POA: Diagnosis not present

## 2016-03-01 DIAGNOSIS — Z1212 Encounter for screening for malignant neoplasm of rectum: Secondary | ICD-10-CM | POA: Diagnosis not present

## 2016-03-01 DIAGNOSIS — Z23 Encounter for immunization: Secondary | ICD-10-CM | POA: Diagnosis not present

## 2016-03-01 DIAGNOSIS — E559 Vitamin D deficiency, unspecified: Secondary | ICD-10-CM | POA: Diagnosis not present

## 2016-03-15 DIAGNOSIS — K5900 Constipation, unspecified: Secondary | ICD-10-CM | POA: Diagnosis not present

## 2016-03-15 DIAGNOSIS — R197 Diarrhea, unspecified: Secondary | ICD-10-CM | POA: Diagnosis not present

## 2016-03-15 DIAGNOSIS — Z1211 Encounter for screening for malignant neoplasm of colon: Secondary | ICD-10-CM | POA: Diagnosis not present

## 2016-03-15 DIAGNOSIS — K625 Hemorrhage of anus and rectum: Secondary | ICD-10-CM | POA: Diagnosis not present

## 2016-03-22 ENCOUNTER — Ambulatory Visit: Payer: Self-pay | Admitting: Family Medicine

## 2016-03-28 DIAGNOSIS — K625 Hemorrhage of anus and rectum: Secondary | ICD-10-CM | POA: Diagnosis not present

## 2016-03-28 DIAGNOSIS — Z1211 Encounter for screening for malignant neoplasm of colon: Secondary | ICD-10-CM | POA: Diagnosis not present

## 2016-03-28 DIAGNOSIS — K921 Melena: Secondary | ICD-10-CM | POA: Diagnosis not present

## 2016-03-28 LAB — HM COLONOSCOPY

## 2016-04-18 DIAGNOSIS — R030 Elevated blood-pressure reading, without diagnosis of hypertension: Secondary | ICD-10-CM | POA: Diagnosis not present

## 2016-06-29 DIAGNOSIS — F430 Acute stress reaction: Secondary | ICD-10-CM | POA: Diagnosis not present

## 2016-06-29 DIAGNOSIS — R030 Elevated blood-pressure reading, without diagnosis of hypertension: Secondary | ICD-10-CM | POA: Diagnosis not present

## 2016-08-10 DIAGNOSIS — Z23 Encounter for immunization: Secondary | ICD-10-CM | POA: Diagnosis not present

## 2017-04-19 DIAGNOSIS — E559 Vitamin D deficiency, unspecified: Secondary | ICD-10-CM | POA: Diagnosis not present

## 2017-04-19 DIAGNOSIS — Z6827 Body mass index (BMI) 27.0-27.9, adult: Secondary | ICD-10-CM | POA: Diagnosis not present

## 2017-04-19 DIAGNOSIS — Z0000 Encounter for general adult medical examination without abnormal findings: Secondary | ICD-10-CM | POA: Diagnosis not present

## 2017-04-19 DIAGNOSIS — R030 Elevated blood-pressure reading, without diagnosis of hypertension: Secondary | ICD-10-CM | POA: Diagnosis not present

## 2017-04-19 DIAGNOSIS — Z1212 Encounter for screening for malignant neoplasm of rectum: Secondary | ICD-10-CM | POA: Diagnosis not present

## 2017-07-19 DIAGNOSIS — I10 Essential (primary) hypertension: Secondary | ICD-10-CM | POA: Diagnosis not present

## 2017-08-22 DIAGNOSIS — I10 Essential (primary) hypertension: Secondary | ICD-10-CM | POA: Diagnosis not present

## 2017-09-24 DIAGNOSIS — L03012 Cellulitis of left finger: Secondary | ICD-10-CM | POA: Diagnosis not present

## 2017-12-05 DIAGNOSIS — I10 Essential (primary) hypertension: Secondary | ICD-10-CM | POA: Diagnosis not present

## 2017-12-05 DIAGNOSIS — R5383 Other fatigue: Secondary | ICD-10-CM | POA: Diagnosis not present

## 2017-12-05 DIAGNOSIS — N529 Male erectile dysfunction, unspecified: Secondary | ICD-10-CM | POA: Diagnosis not present

## 2017-12-06 DIAGNOSIS — N529 Male erectile dysfunction, unspecified: Secondary | ICD-10-CM | POA: Diagnosis not present

## 2018-01-02 DIAGNOSIS — I10 Essential (primary) hypertension: Secondary | ICD-10-CM | POA: Diagnosis not present

## 2018-01-02 DIAGNOSIS — Z79899 Other long term (current) drug therapy: Secondary | ICD-10-CM | POA: Diagnosis not present

## 2018-04-30 DIAGNOSIS — N5201 Erectile dysfunction due to arterial insufficiency: Secondary | ICD-10-CM | POA: Diagnosis not present

## 2018-05-08 DIAGNOSIS — Z6828 Body mass index (BMI) 28.0-28.9, adult: Secondary | ICD-10-CM | POA: Diagnosis not present

## 2018-05-08 DIAGNOSIS — E559 Vitamin D deficiency, unspecified: Secondary | ICD-10-CM | POA: Diagnosis not present

## 2018-05-08 DIAGNOSIS — I10 Essential (primary) hypertension: Secondary | ICD-10-CM | POA: Diagnosis not present

## 2018-05-08 DIAGNOSIS — Z23 Encounter for immunization: Secondary | ICD-10-CM | POA: Diagnosis not present

## 2018-05-16 DIAGNOSIS — N5201 Erectile dysfunction due to arterial insufficiency: Secondary | ICD-10-CM | POA: Diagnosis not present

## 2018-05-27 DIAGNOSIS — J029 Acute pharyngitis, unspecified: Secondary | ICD-10-CM | POA: Diagnosis not present

## 2018-08-27 ENCOUNTER — Ambulatory Visit (INDEPENDENT_AMBULATORY_CARE_PROVIDER_SITE_OTHER): Payer: PPO | Admitting: Nurse Practitioner

## 2018-08-27 ENCOUNTER — Encounter: Payer: Self-pay | Admitting: Nurse Practitioner

## 2018-08-27 VITALS — BP 116/86 | HR 81 | Temp 97.7°F | Ht 76.0 in | Wt 223.6 lb

## 2018-08-27 DIAGNOSIS — S161XXA Strain of muscle, fascia and tendon at neck level, initial encounter: Secondary | ICD-10-CM

## 2018-08-27 DIAGNOSIS — M545 Low back pain, unspecified: Secondary | ICD-10-CM

## 2018-08-27 DIAGNOSIS — Z23 Encounter for immunization: Secondary | ICD-10-CM

## 2018-08-27 MED ORDER — TRIAMCINOLONE ACETONIDE 40 MG/ML IJ SUSP
40.0000 mg | Freq: Once | INTRAMUSCULAR | Status: AC
  Administered 2018-08-27: 40 mg via INTRAMUSCULAR

## 2018-08-27 MED ORDER — CYCLOBENZAPRINE HCL 10 MG PO TABS
10.0000 mg | ORAL_TABLET | Freq: Three times a day (TID) | ORAL | 0 refills | Status: DC | PRN
Start: 2018-08-27 — End: 2018-09-18

## 2018-08-27 MED ORDER — KETOROLAC TROMETHAMINE 60 MG/2ML IM SOLN
60.0000 mg | Freq: Once | INTRAMUSCULAR | Status: AC
  Administered 2018-08-27: 60 mg via INTRAMUSCULAR

## 2018-08-27 NOTE — Progress Notes (Signed)
   Subjective:    Patient ID: Marc [name], male    DOB: 12-07-1950, 67 y.o.   MRN: [id number]  Back Pain  This is a new problem. The current episode started in the past 7 days. The problem occurs constantly. The problem has been gradually worsening since onset. The pain is present in the lumbar spine. The quality of the pain is described as aching. The pain does not radiate. The pain is worse during the day. The symptoms are aggravated by standing. Stiffness is present in the morning. He has tried nothing for the symptoms.  Neck Pain   This is a new problem. The current episode started in the past 7 days. The problem occurs constantly. The problem has been gradually worsening. The pain is associated with a fall. The pain is present in the midline. The quality of the pain is described as aching. The pain is at a severity of 10/10. The symptoms are aggravated by twisting and position. The pain is worse during the day. Stiffness is present in the morning. He has tried nothing for the symptoms.      Review of Systems  Constitutional: Negative.   HENT: Negative.   Respiratory: Negative.   Cardiovascular: Negative.   Musculoskeletal: Positive for back pain and neck pain.       Objective:   Physical Exam  Neck: Neck supple. Spinous process tenderness and muscular tenderness present. Decreased range of motion present. No Brudzinski's sign and no Kernig's sign noted.  Abdominal: Soft. Bowel sounds are normal.  Musculoskeletal: He exhibits tenderness.       Right shoulder: He exhibits decreased range of motion, tenderness and spasm.       Arms: Skin: Skin is warm and dry.    Past Medical History:  Diagnosis Date  . History of cocaine abuse (HCC)   . Osteoarthritis    Past Surgical History:  Procedure Laterality Date  . KNEE ARTHROPLASTY Left 06/21/11   Ollen Gross, MD   Current Outpatient Medications:  .  telmisartan (MICARDIS) 20 MG tablet, Take 20 mg by mouth daily., Disp: , Rfl:  1 .  VITAMIN D, CHOLECALCIFEROL, PO, Take by mouth. Pt takes 500 units daily, Disp: , Rfl:  .  cyclobenzaprine (FLEXERIL) 10 MG tablet, Take 1 tablet (10 mg total) by mouth 3 (three) times daily as needed for muscle spasms., Disp: 20 tablet, Rfl: 0      Assessment & Plan:  Neck strain, initial encounter - use heating pad as needed.  Tylenol 500mg  BID for 3-5 days. - Plan: ketorolac (TORADOL) injection 60 mg, triamcinolone acetonide (KENALOG-40) injection 40 mg, cyclobenzaprine (FLEXERIL) 10 MG tablet  Acute midline low back pain without sciatica - Plan: ketorolac (TORADOL) injection 60 mg, triamcinolone acetonide (KENALOG-40) injection 40 mg, cyclobenzaprine (FLEXERIL) 10 MG tablet  Need for influenza vaccination - Plan: Flu vaccine HIGH DOSE PF (Fluzone High dose)

## 2018-08-27 NOTE — Patient Instructions (Addendum)
Take Tylenol 500 mg twice a day for 3-5 days.  Use ice/heating pad as needed 3-4 times per day.  Neck exercises and back exercise.  Back Pain, Adult Many adults have back pain from time to time. Common causes of back pain include:  A strained muscle or ligament.  Wear and tear (degeneration) of the spinal disks.  Arthritis.  A hit to the back.  Back pain can be short-lived (acute) or last a long time (chronic). A physical exam, lab tests, and imaging studies may be done to find the cause of your pain. Follow these instructions at home: Managing pain and stiffness  Take over-the-counter and prescription medicines only as told by your health care provider.  If directed, apply heat to the affected area as often as told by your health care provider. Use the heat source that your health care provider recommends, such as a moist heat pack or a heating pad. ? Place a towel between your skin and the heat source. ? Leave the heat on for 20-30 minutes. ? Remove the heat if your skin turns bright red. This is especially important if you are unable to feel pain, heat, or cold. You have a greater risk of getting burned.  If directed, apply ice to the injured area: ? Put ice in a plastic bag. ? Place a towel between your skin and the bag. ? Leave the ice on for 20 minutes, 2-3 times a day for the first 2-3 days. Activity  Do not stay in bed. Resting more than 1-2 days can delay your recovery.  Take short walks on even surfaces as soon as you are able. Try to increase the length of time you walk each day.  Do not sit, drive, or stand in one place for more than 30 minutes at a time. Sitting or standing for long periods of time can put stress on your back.  Use proper lifting techniques. When you bend and lift, use positions that put less stress on your back: ? [place]e your knees. ? Keep the load close to your body. ? Avoid twisting.  Exercise regularly as told by your health care provider.  Exercising will help your back heal faster. This also helps prevent back injuries by keeping muscles strong and flexible.  Your health care provider may recommend that you see a physical therapist. This person can help you come up with a safe exercise program. Do any exercises as told by your physical therapist. Lifestyle  Maintain a healthy weight. Extra weight puts stress on your back and makes it difficult to have good posture.  Avoid activities or situations that make you feel anxious or stressed. Learn ways to manage anxiety and stress. One way to manage stress is through exercise. Stress and anxiety increase muscle tension and can make back pain worse. General instructions  Sleep on a firm mattress in a comfortable position. Try lying on your side with your knees slightly bent. If you lie on your back, put a pillow under your knees.  Follow your treatment plan as told by your health care provider. This may include: ? Cognitive or behavioral therapy. ? Acupuncture or massage therapy. ? Meditation or yoga. Contact a health care provider if:  You have pain that is not relieved with rest or medicine.  You have increasing pain going down into your legs or buttocks.  Your pain does not improve in 2 weeks.  You have pain at night.  You lose weight.  You have a  fever or chills. Get help right away if:  You develop new bowel or bladder control problems.  You have unusual weakness or numbness in your arms or legs.  You develop nausea or vomiting.  You develop abdominal pain.  You feel faint. Summary  Many adults have back pain from time to time. A physical exam, lab tests, and imaging studies may be done to find the cause of your pain.  Use proper lifting techniques. When you bend and lift, use positions that put less stress on your back.  Take over-the-counter and prescription medicines and apply heat or ice as directed by your health care provider. This information is not  intended to replace advice given to you by your health care provider. Make sure you discuss any questions you have with your health care provider. Document Released: 11/13/2005 Document Revised: 12/18/2016 Document Reviewed: 12/18/2016 Elsevier Interactive Patient Education  2018 [employer] Influenza Virus Vaccine injection What is this medicine? INFLUENZA VIRUS VACCINE (in floo EN zuh VAHY ruhs vak SEEN) helps to reduce the risk of getting influenza also known as the flu. The vaccine only helps protect you against some strains of the flu. This medicine may be used for other purposes; ask your health care provider or pharmacist if you have questions. COMMON BRAND NAME(S): Afluria, Agriflu, Alfuria, FLUAD, Fluarix, Fluarix Quadrivalent, Flublok, Flublok Quadrivalent, FLUCELVAX, Flulaval, Fluvirin, Fluzone, Fluzone High-Dose, Fluzone Intradermal What should I tell my health care provider before I take this medicine? They need to know if you have any of these conditions: -bleeding disorder like hemophilia -fever or infection -Guillain-Barre syndrome or other neurological problems -immune system problems -infection with the human immunodeficiency virus (HIV) or AIDS -low blood platelet counts -multiple sclerosis -an unusual or allergic reaction to influenza virus vaccine, latex, other medicines, foods, dyes, or preservatives. Different brands of vaccines contain different allergens. Some may contain latex or eggs. Talk to your doctor about your allergies to make sure that you get the right vaccine. -pregnant or trying to get pregnant -breast-feeding How should I use this medicine? This vaccine is for injection into a muscle or under the skin. It is given by a health care professional. A copy of Vaccine Information Statements will be given before each vaccination. Read this sheet carefully each time. The sheet may change frequently. Talk to your healthcare provider to see which vaccines are  right for you. Some vaccines should not be used in all age groups. Overdosage: If you think you have taken too much of this medicine contact a poison control center or emergency room at once. NOTE: This medicine is only for you. Do not share this medicine with others. What if I miss a dose? This does not apply. What may interact with this medicine? -chemotherapy or radiation therapy -medicines that lower your immune system like etanercept, anakinra, infliximab, and adalimumab -medicines that treat or prevent blood clots like warfarin -phenytoin -steroid medicines like prednisone or cortisone -theophylline -vaccines This list may not describe all possible interactions. Give your health care provider a list of all the medicines, herbs, non-prescription drugs, or dietary supplements you use. Also tell them if you smoke, drink alcohol, or use illegal drugs. Some items may interact with your medicine. What should I watch for while using this medicine? Report any side effects that do not go away within 3 days to your doctor or health care professional. Call your health care provider if any unusual symptoms occur within 6 weeks of receiving this vaccine. You may  still catch the flu, but the illness is not usually as bad. You cannot get the flu from the vaccine. The vaccine will not protect against colds or other illnesses that may cause fever. The vaccine is needed every year. What side effects may I notice from receiving this medicine? Side effects that you should report to your doctor or health care professional as soon as possible: -allergic reactions like skin rash, itching or hives, swelling of the face, lips, or tongue Side effects that usually do not require medical attention (report to your doctor or health care professional if they continue or are bothersome): -fever -headache -muscle aches and pains -pain, tenderness, redness, or swelling at the injection site -tiredness This list may  not describe all possible side effects. Call your doctor for medical advice about side effects. You may report side effects to FDA at 1-800-FDA-1088. Where should I keep my medicine? The vaccine will be given by a health care professional in a clinic, pharmacy, doctor's office, or other health care setting. You will not be given vaccine doses to store at home. NOTE: This sheet is a summary. It may not cover all possible information. If you have questions about this medicine, talk to your doctor, pharmacist, or health care provider.  2018 Elsevier/Gold Standard (2015-06-04 10:07:28)  Muscle Cramps and Spasms Muscle cramps and spasms occur when a muscle or muscles tighten and you have no control over this tightening (involuntary muscle contraction). They are a common problem and can develop in any muscle. The most common place is in the calf muscles of the leg. Muscle cramps and muscle spasms are both involuntary muscle contractions, but there are some differences between the two:  Muscle cramps are painful. They come and go and may last a few seconds to 15 minutes. Muscle cramps are often more forceful and last longer than muscle spasms.  Muscle spasms may or may not be painful. They may also last just a few seconds or much longer.  Certain medical conditions, such as diabetes or Parkinson disease, can make it more likely to develop cramps or spasms. However, cramps or spasms are usually not caused by a serious underlying problem. Common causes include:  Overexertion.  Overuse from repetitive motions, or doing the same thing over and over.  Remaining in a certain position for a long period of time.  Improper preparation, form, or technique while playing a sport or doing an activity.  Dehydration.  Injury.  Side effects of some medicines.  Abnormally low levels of the salts and ions in your blood (electrolytes), especially potassium and calcium. This could happen if you are taking water  pills (diuretics) or if you are pregnant.  In many cases, the cause of muscle cramps or spasms is unknown. Follow these instructions at home:  Stay well hydrated. Drink enough fluid to keep your urine clear or pale yellow.  Try massaging, stretching, and relaxing the affected muscle.  If directed, apply heat to tight or tense muscles as often as told by your health care provider. Use the heat source that your health care provider recommends, such as a moist heat pack or a heating pad. ? Place a towel between your skin and the heat source. ? Leave the heat on for 20-30 minutes. ? Remove the heat if your skin turns bright red. This is especially important if you are unable to feel pain, heat, or cold. You may have a greater risk of getting burned.  If directed, put ice on the  affected area. This may help if you are sore or have pain after a cramp or spasm. ? Put ice in a plastic bag. ? Place a towel between your skin and the bag. ? Leavethe ice on for 20 minutes, 2-3 times a day.  Take over-the-counter and prescription medicines only as told by your health care provider.  Pay attention to any changes in your symptoms. Contact a health care provider if:  Your cramps or spasms get more severe or happen more often.  Your cramps or spasms do not improve over time. This information is not intended to replace advice given to you by your health care provider. Make sure you discuss any questions you have with your health care provider. Document Released: 05/05/2002 Document Revised: 12/15/2015 Document Reviewed: 08/17/2015 Elsevier Interactive Patient Education  2018 [employer].

## 2018-09-04 DIAGNOSIS — M545 Low back pain: Secondary | ICD-10-CM | POA: Diagnosis not present

## 2018-09-04 DIAGNOSIS — M542 Cervicalgia: Secondary | ICD-10-CM | POA: Diagnosis not present

## 2018-09-05 ENCOUNTER — Telehealth: Payer: Self-pay

## 2018-09-05 NOTE — Telephone Encounter (Signed)
Called pt to notify him we received his order for a referral to neurology advised him that we need the notes from [facility]. We recevied a disc of the xrays but we are unable to view them because they are on a disc. I also called [facility] and left a v/m for them to give me a call back so I can request paper copy of xrays to be faxed to [place]. YRL,RMA

## 2018-09-18 ENCOUNTER — Encounter: Payer: Self-pay | Admitting: Internal Medicine

## 2018-09-18 ENCOUNTER — Ambulatory Visit (INDEPENDENT_AMBULATORY_CARE_PROVIDER_SITE_OTHER): Payer: PPO | Admitting: Internal Medicine

## 2018-09-18 VITALS — BP 134/98 | HR 74 | Temp 98.1°F | Ht 76.0 in | Wt 228.8 lb

## 2018-09-18 DIAGNOSIS — M5416 Radiculopathy, lumbar region: Secondary | ICD-10-CM | POA: Diagnosis not present

## 2018-09-18 DIAGNOSIS — M542 Cervicalgia: Secondary | ICD-10-CM | POA: Diagnosis not present

## 2018-09-18 DIAGNOSIS — I10 Essential (primary) hypertension: Secondary | ICD-10-CM | POA: Diagnosis not present

## 2018-09-18 DIAGNOSIS — M5413 Radiculopathy, cervicothoracic region: Secondary | ICD-10-CM | POA: Diagnosis not present

## 2018-09-18 DIAGNOSIS — I8312 Varicose veins of left lower extremity with inflammation: Secondary | ICD-10-CM

## 2018-09-18 MED ORDER — GABAPENTIN 100 MG PO CAPS: 100.0000 mg | ORAL_CAPSULE | Freq: Every day | ORAL | 0 refills | Status: DC

## 2018-09-18 MED ORDER — PREDNISONE 10 MG (21) PO TBPK: ORAL_TABLET | ORAL | 0 refills | Status: DC

## 2018-09-18 NOTE — Patient Instructions (Signed)
Back Pain, Adult Back pain is very common. The pain often gets better over time. The cause of back pain is usually not dangerous. Most people can learn to manage their back pain on their own. Follow these instructions at home: Watch your back pain for any changes. The following actions may help to lessen any pain you are feeling:  Stay active. Start with short walks on flat ground if you can. Try to walk farther each day.  Exercise regularly as told by your doctor. Exercise helps your back heal faster. It also helps avoid future injury by keeping your muscles strong and flexible.  Do not sit, drive, or stand in one place for more than 30 minutes.  Do not stay in bed. Resting more than 1-2 days can slow down your recovery.  Be careful when you bend or lift an object. Use good form when lifting: ? Bend at your knees. ? Keep the object close to your body. ? Do not twist.  Sleep on a firm mattress. Lie on your side, and bend your knees. If you lie on your back, put a pillow under your knees.  Take medicines only as told by your doctor.  Put ice on the injured area. ? Put ice in a plastic bag. ? Place a towel between your skin and the bag. ? Leave the ice on for 20 minutes, 2-3 times a day for the first 2-3 days. After that, you can switch between ice and heat packs.  Avoid feeling anxious or stressed. Find good ways to deal with stress, such as exercise.  Maintain a healthy weight. Extra weight puts stress on your back.  Contact a doctor if:  You have pain that does not go away with rest or medicine.  You have worsening pain that goes down into your legs or buttocks.  You have pain that does not get better in one week.  You have pain at night.  You lose weight.  You have a fever or chills. Get help right away if:  You cannot control when you poop (bowel movement) or pee (urinate).  Your arms or legs feel weak.  Your arms or legs lose feeling (numbness).  You feel sick  to your stomach (nauseous) or throw up (vomit).  You have belly (abdominal) pain.  You feel like you may pass out (faint). This information is not intended to replace advice given to you by your health care provider. Make sure you discuss any questions you have with your health care provider. Document Released: 05/01/2008 Document Revised: 04/20/2016 Document Reviewed: 03/17/2014 Elsevier Interactive Patient Education  2018 [employer]. Cervical Radiculopathy Cervical radiculopathy means that a nerve in the neck is pinched or bruised. This can cause pain or loss of feeling (numbness) that runs from your neck to your arm and fingers. Follow these instructions at home: Managing pain  Take over-the-counter and prescription medicines only as told by your doctor.  If directed, put ice on the injured or painful area. ? Put ice in a plastic bag. ? Place a towel between your skin and the bag. ? Leave the ice on for 20 minutes, 2-3 times per day.  If ice does not help, you can try using heat. Take a warm shower or warm bath, or use a heat pack as told by your doctor.  You may try a gentle neck and shoulder massage. Activity  Rest as needed. Follow instructions from your doctor about any activities to avoid.  Do exercises as told  by your doctor or physical therapist. General instructions  If you were given a soft collar, wear it as told by your doctor.  Use a flat pillow when you sleep.  Keep all follow-up visits as told by your doctor. This is important. Contact a doctor if:  Your condition does not improve with treatment. Get help right away if:  Your pain gets worse and is not controlled with medicine.  You lose feeling or feel weak in your hand, arm, face, or leg.  You have a fever.  You have a stiff neck.  You cannot control when you poop or pee (have incontinence).  You have trouble with walking, balance, or talking. This information is not intended to replace advice  given to you by your health care provider. Make sure you discuss any questions you have with your health care provider. Document Released: 11/02/2011 Document Revised: 04/20/2016 Document Reviewed: 01/07/2015 Elsevier Interactive Patient Education  [employer].

## 2018-09-18 NOTE — Progress Notes (Signed)
  Subjective:     Patient ID: Marc [name] , male    DOB: 10-Feb-1951 , 67 y.o.   MRN: [id number]   He is here today to discuss recent Ortho appointment. He c/o neck and back pain. He has pain that radiates to both upper and lower extremities. The pain has made it difficult for him to complete some of his ADLs independently.  He was given rx medication from Ortho, but he has not had any improvements in his symptoms.     Past Medical History:  Diagnosis Date  . History of cocaine abuse (HCC)   . Osteoarthritis       Current Outpatient Medications:  .  telmisartan (MICARDIS) 20 MG tablet, Take 20 mg by mouth daily., Disp: , Rfl: 1 .  VITAMIN D, CHOLECALCIFEROL, PO, Take by mouth. Pt takes 500 units daily, Disp: , Rfl:  .  gabapentin (NEURONTIN) 100 MG capsule, Take 1 capsule (100 mg total) by mouth at bedtime., Disp: 30 capsule, Rfl: 0 .  predniSONE (STERAPRED UNI-PAK 21 TAB) 10 MG (21) TBPK tablet, USE AS DIRECTED, DISPENSE AS STEROID DOSE PAK, Disp: 21 tablet, Rfl: 0   No Known Allergies   Review of Systems  Constitutional: Negative.   Respiratory: Negative.   Cardiovascular: Negative.   Gastrointestinal: Negative.   Genitourinary: Negative.   Musculoskeletal: Positive for back pain and neck pain.  Neurological: Negative.      Today's Vitals   09/18/18 1538  BP: (!) 134/98  Pulse: 74  Temp: 98.1 F (36.7 C)  TempSrc: Oral  Weight: 228 lb 12.8 oz (103.8 kg)  Height: 6\' 4"  (1.93 m)  PainSc: 6   PainLoc: Back   Body mass index is 27.85 kg/m.   Objective:  Physical Exam  Constitutional: He is oriented to person, place, and time. He appears well-developed and well-nourished. He appears distressed.  HENT:  Head: Normocephalic and atraumatic.  Cardiovascular: Normal rate, regular rhythm and normal heart sounds.  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   Pulmonary/Chest: Effort normal and breath sounds normal.  Musculoskeletal:  He has neg straight leg test.    Neurological: He is alert and oriented to person, place, and time.  Nursing note and vitals reviewed.       Assessment And Plan:     1. Radiculopathy of cervicothoracic region  I will refer him for MRI cervical spine. He would likely benefit from physical therapy w/ dry needling. I will make further recommendations once his results are available for review.   2. Radiculopathy of lumbar region  I will refer him for MRI lumbar spine. Please see #1. I will also start him on gabapentin, 100mg  nightly x 7 days, then increase to 200mg  nightly. He will rto in four weeks for re-evaluation. He is encouraged to contact me in two weeks to let me know if he is feeling any better.   3. Essential hypertension, benign  Uncontrolled. BP elevation is likely due to pain. He will continue with current meds for now.   4. Cervicalgia  He is encouraged to consider chiropractic therapy.   5. Varicose veins of left lower extremity with inflammation  I will refer him to [facility] per his request. He is encouraged to wear compression hose, elevate legs while seated and start mg supplementation.   Gwynneth Aliment, MD

## 2018-09-30 DIAGNOSIS — R600 Localized edema: Secondary | ICD-10-CM | POA: Diagnosis not present

## 2018-09-30 DIAGNOSIS — M79605 Pain in left leg: Secondary | ICD-10-CM | POA: Diagnosis not present

## 2018-09-30 DIAGNOSIS — S81802A Unspecified open wound, left lower leg, initial encounter: Secondary | ICD-10-CM | POA: Diagnosis not present

## 2018-10-02 DIAGNOSIS — R600 Localized edema: Secondary | ICD-10-CM | POA: Diagnosis not present

## 2018-10-02 DIAGNOSIS — M79605 Pain in left leg: Secondary | ICD-10-CM | POA: Diagnosis not present

## 2018-10-02 DIAGNOSIS — S81802A Unspecified open wound, left lower leg, initial encounter: Secondary | ICD-10-CM | POA: Diagnosis not present

## 2018-10-06 ENCOUNTER — Encounter: Payer: Self-pay | Admitting: Internal Medicine

## 2018-10-08 DIAGNOSIS — M545 Low back pain: Secondary | ICD-10-CM | POA: Diagnosis not present

## 2018-10-08 DIAGNOSIS — M542 Cervicalgia: Secondary | ICD-10-CM | POA: Diagnosis not present

## 2018-10-08 DIAGNOSIS — M5412 Radiculopathy, cervical region: Secondary | ICD-10-CM | POA: Diagnosis not present

## 2018-10-08 DIAGNOSIS — M5030 Other cervical disc degeneration, unspecified cervical region: Secondary | ICD-10-CM | POA: Diagnosis not present

## 2018-10-10 ENCOUNTER — Other Ambulatory Visit: Payer: Self-pay | Admitting: Internal Medicine

## 2018-10-10 DIAGNOSIS — L97221 Non-pressure chronic ulcer of left calf limited to breakdown of skin: Secondary | ICD-10-CM | POA: Diagnosis not present

## 2018-10-10 DIAGNOSIS — I87022 Postthrombotic syndrome with inflammation of left lower extremity: Secondary | ICD-10-CM | POA: Diagnosis not present

## 2018-10-11 NOTE — Telephone Encounter (Signed)
Gabapentin refill

## 2018-10-17 DIAGNOSIS — M542 Cervicalgia: Secondary | ICD-10-CM | POA: Diagnosis not present

## 2018-10-21 DIAGNOSIS — G540 Brachial plexus disorders: Secondary | ICD-10-CM | POA: Diagnosis not present

## 2018-10-21 DIAGNOSIS — G5602 Carpal tunnel syndrome, left upper limb: Secondary | ICD-10-CM | POA: Diagnosis not present

## 2018-10-21 DIAGNOSIS — G5622 Lesion of ulnar nerve, left upper limb: Secondary | ICD-10-CM | POA: Diagnosis not present

## 2018-10-28 DIAGNOSIS — M5030 Other cervical disc degeneration, unspecified cervical region: Secondary | ICD-10-CM | POA: Diagnosis not present

## 2018-10-28 DIAGNOSIS — M5412 Radiculopathy, cervical region: Secondary | ICD-10-CM | POA: Diagnosis not present

## 2018-10-28 DIAGNOSIS — G540 Brachial plexus disorders: Secondary | ICD-10-CM | POA: Diagnosis not present

## 2018-11-05 DIAGNOSIS — M25512 Pain in left shoulder: Secondary | ICD-10-CM | POA: Diagnosis not present

## 2018-11-06 DIAGNOSIS — M5412 Radiculopathy, cervical region: Secondary | ICD-10-CM | POA: Diagnosis not present

## 2018-11-07 ENCOUNTER — Encounter: Payer: Self-pay | Admitting: Internal Medicine

## 2018-11-07 ENCOUNTER — Ambulatory Visit (INDEPENDENT_AMBULATORY_CARE_PROVIDER_SITE_OTHER): Payer: PPO | Admitting: Internal Medicine

## 2018-11-07 VITALS — BP 114/86 | HR 85 | Temp 98.6°F | Ht 76.0 in | Wt 233.6 lb

## 2018-11-07 DIAGNOSIS — I10 Essential (primary) hypertension: Secondary | ICD-10-CM

## 2018-11-07 DIAGNOSIS — Z6828 Body mass index (BMI) 28.0-28.9, adult: Secondary | ICD-10-CM | POA: Diagnosis not present

## 2018-11-07 DIAGNOSIS — T452X1A Poisoning by vitamins, accidental (unintentional), initial encounter: Secondary | ICD-10-CM | POA: Diagnosis not present

## 2018-11-07 DIAGNOSIS — E663 Overweight: Secondary | ICD-10-CM

## 2018-11-07 DIAGNOSIS — Z79899 Other long term (current) drug therapy: Secondary | ICD-10-CM | POA: Diagnosis not present

## 2018-11-07 DIAGNOSIS — M5412 Radiculopathy, cervical region: Secondary | ICD-10-CM

## 2018-11-07 NOTE — Patient Instructions (Signed)
DASH Eating Plan DASH stands for "Dietary Approaches to Stop Hypertension." The DASH eating plan is a healthy eating plan that has been shown to reduce high blood pressure (hypertension). It may also reduce your risk for type 2 diabetes, heart disease, and stroke. The DASH eating plan may also help with weight loss. What are tips for following this plan? General guidelines  Avoid eating more than 2,300 mg (milligrams) of salt (sodium) a day. If you have hypertension, you may need to reduce your sodium intake to 1,500 mg a day.  Limit alcohol intake to no more than 1 drink a day for nonpregnant women and 2 drinks a day for men. One drink equals 12 oz of beer, 5 oz of wine, or 1 oz of hard liquor.  Work with your health care provider to maintain a healthy body weight or to lose weight. Ask what an ideal weight is for you.  Get at least 30 minutes of exercise that causes your heart to beat faster (aerobic exercise) most days of the week. Activities may include walking, swimming, or biking.  Work with your health care provider or diet and nutrition specialist (dietitian) to adjust your eating plan to your individual calorie needs. Reading food labels  Check food labels for the amount of sodium per serving. Choose foods with less than 5 percent of the Daily Value of sodium. Generally, foods with less than 300 mg of sodium per serving fit into this eating plan.  To find whole grains, look for the word "whole" as the first word in the ingredient list. Shopping  Buy products labeled as "low-sodium" or "no salt added."  Buy fresh foods. Avoid canned foods and premade or frozen meals. Cooking  Avoid adding salt when cooking. Use salt-free seasonings or herbs instead of table salt or sea salt. Check with your health care provider or pharmacist before using salt substitutes.  Do not fry foods. Cook foods using healthy methods such as baking, boiling, grilling, and broiling instead.  Cook with  heart-healthy oils, such as olive, canola, soybean, or sunflower oil. Meal planning   Eat a balanced diet that includes: ? 5 or more servings of fruits and vegetables each day. At each meal, try to fill half of your plate with fruits and vegetables. ? Up to 6-8 servings of whole grains each day. ? Less than 6 oz of lean meat, poultry, or fish each day. A 3-oz serving of meat is about the same size as a deck of cards. One egg equals 1 oz. ? 2 servings of low-fat dairy each day. ? A serving of nuts, seeds, or beans 5 times each week. ? Heart-healthy fats. Healthy fats called Omega-3 fatty acids are found in foods such as flaxseeds and coldwater fish, like sardines, salmon, and mackerel.  Limit how much you eat of the following: ? Canned or prepackaged foods. ? Food that is high in trans fat, such as fried foods. ? Food that is high in saturated fat, such as fatty meat. ? Sweets, desserts, sugary drinks, and other foods with added sugar. ? Full-fat dairy products.  Do not salt foods before eating.  Try to eat at least 2 vegetarian meals each week.  Eat more home-cooked food and less restaurant, buffet, and fast food.  When eating at a restaurant, ask that your food be prepared with less salt or no salt, if possible. What foods are recommended? The items listed may not be a complete list. Talk with your dietitian about what   dietary choices are best for you. Grains Whole-grain or whole-wheat bread. Whole-grain or whole-wheat pasta. Brown rice. Oatmeal. Quinoa. Bulgur. Whole-grain and low-sodium cereals. Pita bread. Low-fat, low-sodium crackers. Whole-wheat flour tortillas. Vegetables Fresh or frozen vegetables (raw, steamed, roasted, or grilled). Low-sodium or reduced-sodium tomato and vegetable juice. Low-sodium or reduced-sodium tomato sauce and tomato paste. Low-sodium or reduced-sodium canned vegetables. Fruits All fresh, dried, or frozen fruit. Canned fruit in natural juice (without  added sugar). Meat and other protein foods Skinless chicken or turkey. Ground chicken or turkey. Pork with fat trimmed off. Fish and seafood. Egg whites. Dried beans, peas, or lentils. Unsalted nuts, nut butters, and seeds. Unsalted canned beans. Lean cuts of beef with fat trimmed off. Low-sodium, lean deli meat. Dairy Low-fat (1%) or fat-free (skim) milk. Fat-free, low-fat, or reduced-fat cheeses. Nonfat, low-sodium ricotta or cottage cheese. Low-fat or nonfat yogurt. Low-fat, low-sodium cheese. Fats and oils Soft margarine without trans fats. Vegetable oil. Low-fat, reduced-fat, or light mayonnaise and salad dressings (reduced-sodium). Canola, safflower, olive, soybean, and sunflower oils. Avocado. Seasoning and other foods Herbs. Spices. Seasoning mixes without salt. Unsalted popcorn and pretzels. Fat-free sweets. What foods are not recommended? The items listed may not be a complete list. Talk with your dietitian about what dietary choices are best for you. Grains Baked goods made with fat, such as croissants, muffins, or some breads. Dry pasta or rice meal packs. Vegetables Creamed or fried vegetables. Vegetables in a cheese sauce. Regular canned vegetables (not low-sodium or reduced-sodium). Regular canned tomato sauce and paste (not low-sodium or reduced-sodium). Regular tomato and vegetable juice (not low-sodium or reduced-sodium). Pickles. Olives. Fruits Canned fruit in a light or heavy syrup. Fried fruit. Fruit in cream or butter sauce. Meat and other protein foods Fatty cuts of meat. Ribs. Fried meat. Bacon. Sausage. Bologna and other processed lunch meats. Salami. Fatback. Hotdogs. Bratwurst. Salted nuts and seeds. Canned beans with added salt. Canned or smoked fish. Whole eggs or egg yolks. Chicken or turkey with skin. Dairy Whole or 2% milk, cream, and half-and-half. Whole or full-fat cream cheese. Whole-fat or sweetened yogurt. Full-fat cheese. Nondairy creamers. Whipped toppings.  Processed cheese and cheese spreads. Fats and oils Butter. Stick margarine. Lard. Shortening. Ghee. Bacon fat. Tropical oils, such as coconut, palm kernel, or palm oil. Seasoning and other foods Salted popcorn and pretzels. Onion salt, garlic salt, seasoned salt, table salt, and sea salt. Worcestershire sauce. Tartar sauce. Barbecue sauce. Teriyaki sauce. Soy sauce, including reduced-sodium. Steak sauce. Canned and packaged gravies. Fish sauce. Oyster sauce. Cocktail sauce. Horseradish that you find on the shelf. Ketchup. Mustard. Meat flavorings and tenderizers. Bouillon cubes. Hot sauce and Tabasco sauce. Premade or packaged marinades. Premade or packaged taco seasonings. Relishes. Regular salad dressings. Where to find more information:  National Heart, Lung, and Blood Institute: www.nhlbi.nih.gov  American Heart Association: www.heart.org Summary  The DASH eating plan is a healthy eating plan that has been shown to reduce high blood pressure (hypertension). It may also reduce your risk for type 2 diabetes, heart disease, and stroke.  With the DASH eating plan, you should limit salt (sodium) intake to 2,300 mg a day. If you have hypertension, you may need to reduce your sodium intake to 1,500 mg a day.  When on the DASH eating plan, aim to eat more fresh fruits and vegetables, whole grains, lean proteins, low-fat dairy, and heart-healthy fats.  Work with your health care provider or diet and nutrition specialist (dietitian) to adjust your eating plan to your individual   calorie needs. This information is not intended to replace advice given to you by your health care provider. Make sure you discuss any questions you have with your health care provider. Document Released: 11/02/2011 Document Revised: 11/06/2016 Document Reviewed: 11/06/2016 Elsevier Interactive Patient Education  2018 Elsevier Inc.  

## 2018-11-07 NOTE — Progress Notes (Signed)
Subjective:     Patient ID: Marc [name] , male    DOB: Sep 03, 1951 , 67 y.o.   MRN: [id number]   Chief Complaint  Patient presents with  . Hypertension    HPI   Hypertension  This is a chronic problem. The current episode started more than 1 year ago. The problem has been gradually improving since onset. The problem is controlled. Associated symptoms include neck pain (he has been eval by Dr. Rolena Infante. He has had some relief of his sx. Now on higher dose of gabapentin. He has gone to PT. He has had Nerve conduction testing by Dr. Nelva Bush. He does feel he is starting to improve. ). Pertinent negatives include no blurred vision, chest pain, headaches or shortness of breath.  He reports compliance with medications.    Past Medical History:  Diagnosis Date  . History of cocaine abuse ([place])   . Osteoarthritis      Family History  Problem Relation Age of Onset  . Stroke Mother 27  . Hypertension Mother   . Healthy Father      Current Outpatient Medications:  .  gabapentin (NEURONTIN) 300 MG capsule, Take 300 mg by mouth 3 (three) times daily., Disp: , Rfl:  .  telmisartan (MICARDIS) 20 MG tablet, Take 20 mg by mouth daily., Disp: , Rfl: 1 .  VITAMIN D, CHOLECALCIFEROL, PO, Take by mouth. Pt takes 500 units daily, Disp: , Rfl:    No Known Allergies   Review of Systems  Constitutional: Negative.   Eyes: Negative for blurred vision.  Respiratory: Negative.  Negative for shortness of breath.   Cardiovascular: Negative.  Negative for chest pain.  Gastrointestinal: Negative.   Musculoskeletal: Positive for neck pain (he has been eval by Dr. Rolena Infante. He has had some relief of his sx. Now on higher dose of gabapentin. He has gone to PT. He has had Nerve conduction testing by Dr. Nelva Bush. He does feel he is starting to improve. ).  Neurological: Negative.  Negative for headaches.     Today's Vitals   11/07/18 1413  BP: 114/86  Pulse: 85  Temp: 98.6 F (37 C)  TempSrc: Oral  Weight:  233 lb 9.6 oz (106 kg)  Height: 6' 4"  (1.93 m)  PainSc: 0-No pain   Body mass index is 28.43 kg/m.   Objective:  Physical Exam Vitals signs and nursing note reviewed.  Constitutional:      Appearance: Normal appearance.  HENT:     Head: Normocephalic and atraumatic.  Neck:     Musculoskeletal: Normal range of motion.  Cardiovascular:     Rate and Rhythm: Normal rate and regular rhythm.     Heart sounds: Normal heart sounds.  Pulmonary:     Effort: Pulmonary effort is normal.     Breath sounds: Normal breath sounds.  Skin:    General: Skin is warm and dry.  Neurological:     General: No focal deficit present.     Mental Status: He is alert.  Psychiatric:        Mood and Affect: Mood normal.         Assessment And Plan:     1. Essential hypertension, benign  Well controlled. He will continue with current meds for now. He is encouraged to avoid adding salt to his foods.   2. Cervical radiculopathy  This is now chronic. He is now up to gabapentin 323m tid as per Ortho. Thankfully, he has had some relief in his symptoms. Recent  Ortho notes reviewed during his visit.   3. Drug therapy  - CMP14+EGFR  4. Poisoning by vitamin D, accidental or unintentional, initial encounter  He has had elevated vitamin D levels in the past. I will recheck his levels today. He reports he recently ran out, so he is not taking any vitamin D supplements at this time.  - Vitamin D (25 hydroxy)  5. Adult BMI 28.0-28.9 kg/sq m  His weight is stable. He is encouraged to continue with his regular exercise regimen.   6. Overweight (BMI 25.0-29.9)   [name], MD

## 2018-11-08 LAB — CMP14+EGFR
A/G RATIO: 1.6 (ref 1.2–2.2)
ALK PHOS: 71 IU/L (ref 39–117)
ALT: 26 IU/L (ref 0–44)
AST: 37 IU/L (ref 0–40)
Albumin: 4.3 g/dL (ref 3.6–4.8)
BUN/Creatinine Ratio: 13 (ref 10–24)
BUN: 12 mg/dL (ref 8–27)
Bilirubin Total: 0.5 mg/dL (ref 0.0–1.2)
CALCIUM: 9.5 mg/dL (ref 8.6–10.2)
CO2: 21 mmol/L (ref 20–29)
CREATININE: 0.92 mg/dL (ref 0.76–1.27)
Chloride: 102 mmol/L (ref 96–106)
GFR calc Af Amer: 99 mL/min/{1.73_m2} (ref >59)
GFR, EST NON AFRICAN AMERICAN: 86 mL/min/{1.73_m2} (ref >59)
Globulin, Total: 2.7 g/dL (ref 1.5–4.5)
Glucose: 74 mg/dL (ref 65–99)
POTASSIUM: 4.7 mmol/L (ref 3.5–5.2)
Sodium: 142 mmol/L (ref 134–144)
Total Protein: 7 g/dL (ref 6.0–8.5)

## 2018-11-08 LAB — VITAMIN D 25 HYDROXY (VIT D DEFICIENCY, FRACTURES): VIT D 25 HYDROXY: 84.4 ng/mL (ref 30.0–100.0)

## 2018-11-09 ENCOUNTER — Encounter: Payer: Self-pay | Admitting: Internal Medicine

## 2018-11-09 NOTE — Progress Notes (Signed)
Here are your lab results:  Your liver and kidney function are normal. Your vitamin d level is within normal limits. However, the levels are higher than I expected since you stated you have been out of your supplements for awhile. Please tell me again, which dose of vitamin d were you taking? And, was this daily dosing or did you take certain days of the week?   Please respond at your earliest convenience so I can further advise you on which dose will be best for vitamin D supplementation.   Happy holidays!  Sincerely,    Sir Mallis N. Allyne GeeSanders, MD

## 2018-11-12 DIAGNOSIS — M25512 Pain in left shoulder: Secondary | ICD-10-CM | POA: Diagnosis not present

## 2018-11-13 DIAGNOSIS — M5412 Radiculopathy, cervical region: Secondary | ICD-10-CM | POA: Diagnosis not present

## 2018-11-15 DIAGNOSIS — M25522 Pain in left elbow: Secondary | ICD-10-CM | POA: Diagnosis not present

## 2018-11-15 DIAGNOSIS — M7502 Adhesive capsulitis of left shoulder: Secondary | ICD-10-CM | POA: Diagnosis not present

## 2018-11-15 DIAGNOSIS — M19022 Primary osteoarthritis, left elbow: Secondary | ICD-10-CM | POA: Diagnosis not present

## 2018-11-21 DIAGNOSIS — M7502 Adhesive capsulitis of left shoulder: Secondary | ICD-10-CM | POA: Insufficient documentation

## 2018-11-21 DIAGNOSIS — M19029 Primary osteoarthritis, unspecified elbow: Secondary | ICD-10-CM | POA: Insufficient documentation

## 2018-11-29 DIAGNOSIS — M5412 Radiculopathy, cervical region: Secondary | ICD-10-CM | POA: Diagnosis not present

## 2018-12-06 DIAGNOSIS — M5412 Radiculopathy, cervical region: Secondary | ICD-10-CM | POA: Diagnosis not present

## 2018-12-13 DIAGNOSIS — M5412 Radiculopathy, cervical region: Secondary | ICD-10-CM | POA: Diagnosis not present

## 2018-12-20 DIAGNOSIS — M5412 Radiculopathy, cervical region: Secondary | ICD-10-CM | POA: Diagnosis not present

## 2018-12-27 DIAGNOSIS — M7502 Adhesive capsulitis of left shoulder: Secondary | ICD-10-CM | POA: Diagnosis not present

## 2019-01-01 DIAGNOSIS — M5412 Radiculopathy, cervical region: Secondary | ICD-10-CM | POA: Diagnosis not present

## 2019-01-08 DIAGNOSIS — M5412 Radiculopathy, cervical region: Secondary | ICD-10-CM | POA: Diagnosis not present

## 2019-01-14 DIAGNOSIS — M25562 Pain in left knee: Secondary | ICD-10-CM | POA: Diagnosis not present

## 2019-01-17 DIAGNOSIS — M5412 Radiculopathy, cervical region: Secondary | ICD-10-CM | POA: Diagnosis not present

## 2019-02-03 DIAGNOSIS — M5136 Other intervertebral disc degeneration, lumbar region: Secondary | ICD-10-CM | POA: Diagnosis not present

## 2019-06-04 ENCOUNTER — Ambulatory Visit (INDEPENDENT_AMBULATORY_CARE_PROVIDER_SITE_OTHER): Payer: PPO

## 2019-06-04 ENCOUNTER — Other Ambulatory Visit: Payer: Self-pay

## 2019-06-04 ENCOUNTER — Encounter: Payer: Self-pay | Admitting: Internal Medicine

## 2019-06-04 ENCOUNTER — Ambulatory Visit (INDEPENDENT_AMBULATORY_CARE_PROVIDER_SITE_OTHER): Payer: PPO | Admitting: Internal Medicine

## 2019-06-04 VITALS — BP 140/78 | HR 61 | Temp 98.2°F | Ht 74.2 in | Wt 231.4 lb

## 2019-06-04 DIAGNOSIS — E663 Overweight: Secondary | ICD-10-CM

## 2019-06-04 DIAGNOSIS — R0781 Pleurodynia: Secondary | ICD-10-CM | POA: Diagnosis not present

## 2019-06-04 DIAGNOSIS — Z23 Encounter for immunization: Secondary | ICD-10-CM

## 2019-06-04 DIAGNOSIS — Z0000 Encounter for general adult medical examination without abnormal findings: Secondary | ICD-10-CM | POA: Diagnosis not present

## 2019-06-04 DIAGNOSIS — Z79899 Other long term (current) drug therapy: Secondary | ICD-10-CM | POA: Diagnosis not present

## 2019-06-04 DIAGNOSIS — I10 Essential (primary) hypertension: Secondary | ICD-10-CM

## 2019-06-04 MED ORDER — BOOSTRIX 5-2.5-18.5 LF-MCG/0.5 IM SUSP: 0.5000 mL | Freq: Once | INTRAMUSCULAR | 0 refills | Status: AC

## 2019-06-04 NOTE — Patient Instructions (Addendum)
Please start magnesium 400mg  at night - you may get over the counter    Mediterranean Diet A Mediterranean diet refers to food and lifestyle choices that are based on the traditions of countries located on the [employer]. This way of eating has been shown to help prevent certain conditions and improve outcomes for people who have chronic diseases, like kidney disease and heart disease. What are tips for following this plan? Lifestyle  Cook and eat meals together with your family, when possible.  Drink enough fluid to keep your urine clear or pale yellow.  Be physically active every day. This includes: ? Aerobic exercise like running or swimming. ? Leisure activities like gardening, walking, or housework.  Get 7-8 hours of sleep each night.  If recommended by your health care provider, drink red wine in moderation. This means 1 glass a day for nonpregnant women and 2 glasses a day for men. A glass of wine equals 5 oz (150 mL). Reading food labels   Check the serving size of packaged foods. For foods such as rice and pasta, the serving size refers to the amount of cooked product, not dry.  Check the total fat in packaged foods. Avoid foods that have saturated fat or trans fats.  Check the ingredients list for added sugars, such as corn syrup. Shopping  At the grocery store, buy most of your food from the areas near the walls of the store. This includes: ? Fresh fruits and vegetables (produce). ? Grains, beans, nuts, and seeds. Some of these may be available in unpackaged forms or large amounts (in bulk). ? Fresh seafood. ? Poultry and eggs. ? Low-fat dairy products.  Buy whole ingredients instead of prepackaged foods.  Buy fresh fruits and vegetables in-season from local farmers markets.  Buy frozen fruits and vegetables in resealable bags.  If you do not have access to quality fresh seafood, buy precooked frozen shrimp or canned fish, such as tuna, salmon, or  sardines.  Buy small amounts of raw or cooked vegetables, salads, or olives from the deli or salad bar at your store.  Stock your pantry so you always have certain foods on hand, such as olive oil, canned tuna, canned tomatoes, rice, pasta, and beans. Cooking  Cook foods with extra-virgin olive oil instead of using butter or other vegetable oils.  Have meat as a side dish, and have vegetables or grains as your main dish. This means having meat in small portions or adding small amounts of meat to foods like pasta or stew.  Use beans or vegetables instead of meat in common dishes like chili or lasagna.  Experiment with different cooking methods. Try roasting or broiling vegetables instead of steaming or sauteing them.  Add frozen vegetables to soups, stews, pasta, or rice.  Add nuts or seeds for added healthy fat at each meal. You can add these to yogurt, salads, or vegetable dishes.  Marinate fish or vegetables using olive oil, lemon juice, garlic, and fresh herbs. Meal planning   Plan to eat 1 vegetarian meal one day each week. Try to work up to 2 vegetarian meals, if possible.  Eat seafood 2 or more times a week.  Have healthy snacks readily available, such as: ? Vegetable sticks with hummus. ? [place] yogurt. ? Fruit and nut trail mix.  Eat balanced meals throughout the week. This includes: ? Fruit: 2-3 servings a day ? Vegetables: 4-5 servings a day ? Low-fat dairy: 2 servings a day ? Fish, poultry, or  lean meat: 1 serving a day ? Beans and legumes: 2 or more servings a week ? Nuts and seeds: 1-2 servings a day ? Whole grains: 6-8 servings a day ? Extra-virgin olive oil: 3-4 servings a day  Limit red meat and sweets to only a few servings a month What are my food choices?  Mediterranean diet ? Recommended  Grains: Whole-grain pasta. Brown rice. Bulgar wheat. Polenta. Couscous. Whole-wheat bread. [place] Cobbatmeal. Quinoa.  Vegetables: Artichokes. Beets. Broccoli. Cabbage.  Carrots. Eggplant. Green beans. Chard. Kale. Spinach. Onions. Leeks. Peas. Squash. Tomatoes. Peppers. Radishes.  Fruits: Apples. Apricots. Avocado. Berries. Bananas. Cherries. Dates. Figs. Grapes. Lemons. Melon. Oranges. Peaches. Plums. Pomegranate.  Meats and other protein foods: Beans. Almonds. Sunflower seeds. Pine nuts. Peanuts. Cod. Salmon. Scallops. Shrimp. Tuna. Tilapia. Clams. Oysters. Eggs.  Dairy: Low-fat milk. Cheese. Greek yogurt.  Beverages: Water. Red wine. Herbal tea.  Fats and oils: Extra virgin olive oil. Avocado oil. Grape seed oil.  Sweets and desserts: [place] yogurt with honey. Baked apples. Poached pears. Trail mix.  Seasoning and other foods: Basil. Cilantro. Coriander. Cumin. Mint. Parsley. Sage. Rosemary. Tarragon. Garlic. Oregano. Thyme. Pepper. Balsalmic vinegar. Tahini. Hummus. Tomato sauce. Olives. Mushrooms. ? Limit these  Grains: Prepackaged pasta or rice dishes. Prepackaged cereal with added sugar.  Vegetables: Deep fried potatoes (french fries).  Fruits: Fruit canned in syrup.  Meats and other protein foods: Beef. Pork. Lamb. Poultry with skin. Hot dogs. Tomasa BlaseBacon.  Dairy: Ice cream. Sour cream. Whole milk.  Beverages: Juice. Sugar-sweetened soft drinks. Beer. Liquor and spirits.  Fats and oils: Butter. Canola oil. Vegetable oil. Beef fat (tallow). Lard.  Sweets and desserts: Cookies. Cakes. Pies. Candy.  Seasoning and other foods: Mayonnaise. Premade sauces and marinades. The items listed may not be a complete list. Talk with your dietitian about what dietary choices are right for you. Summary  The Mediterranean diet includes both food and lifestyle choices.  Eat a variety of fresh fruits and vegetables, beans, nuts, seeds, and whole grains.  Limit the amount of red meat and sweets that you eat.  Talk with your health care provider about whether it is safe for you to drink red wine in moderation. This means 1 glass a day for nonpregnant women and  2 glasses a day for men. A glass of wine equals 5 oz (150 mL). This information is not intended to replace advice given to you by your health care provider. Make sure you discuss any questions you have with your health care provider. Document Released: 07/06/2016 Document Revised: 07/13/2016 Document Reviewed: 07/06/2016 Elsevier Patient Education  2020 [employer]
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
## 2019-06-04 NOTE — Patient Instructions (Addendum)
Marc [name] , Thank you for taking time to come for your Medicare Wellness Visit. I appreciate your ongoing commitment to your health goals. Please review the following plan we discussed and let me know if I can assist you in the future.   Screening recommendations/referrals: Colonoscopy: requesting report Recommended yearly ophthalmology/optometry visit for glaucoma screening and checkup Recommended yearly dental visit for hygiene and checkup  Vaccinations: Influenza vaccine: 08/2018 Pneumococcal vaccine: 04/2018 Tdap vaccine: sent to pharmacy Shingles vaccine: discussed    Advanced directives: Advance directive discussed with you today. I have provided a copy for you to complete at home and have notarized. Once this is complete please bring a copy in to our office so we can scan it into your chart.   Conditions/risks identified: overweight  Next appointment: 09/04/2019 at 2:00  Preventive Care 25 Years and Older, Male Preventive care refers to lifestyle choices and visits with your health care provider that can promote health and wellness. What does preventive care include?  A yearly physical exam. This is also called an annual well check.  Dental exams once or twice a year.  Routine eye exams. Ask your health care provider how often you should have your eyes checked.  Personal lifestyle choices, including:  Daily care of your teeth and gums.  Regular physical activity.  Eating a healthy diet.  Avoiding tobacco and drug use.  Limiting alcohol use.  Practicing safe sex.  Taking low doses of aspirin every day.  Taking vitamin and mineral supplements as recommended by your health care provider. What happens during an annual well check? The services and screenings done by your health care provider during your annual well check will depend on your age, overall health, lifestyle risk factors, and family history of disease. Counseling  Your health care provider may ask you  questions about your:  Alcohol use.  Tobacco use.  Drug use.  Emotional well-being.  Home and relationship well-being.  Sexual activity.  Eating habits.  History of falls.  Memory and ability to understand (cognition).  Work and work Statistician. Screening  You may have the following tests or measurements:  Height, weight, and BMI.  Blood pressure.  Lipid and cholesterol levels. These may be checked every 5 years, or more frequently if you are over 56 years old.  Skin check.  Lung cancer screening. You may have this screening every year starting at age 28 if you have a 30-pack-year history of smoking and currently smoke or have quit within the past 15 years.  Fecal occult blood test (FOBT) of the stool. You may have this test every year starting at age 29.  Flexible sigmoidoscopy or colonoscopy. You may have a sigmoidoscopy every 5 years or a colonoscopy every 10 years starting at age 83.  Prostate cancer screening. Recommendations will vary depending on your family history and other risks.  Hepatitis C blood test.  Hepatitis B blood test.  Sexually transmitted disease (STD) testing.  Diabetes screening. This is done by checking your blood sugar (glucose) after you have not eaten for a while (fasting). You may have this done every 1-3 years.  Abdominal aortic aneurysm (AAA) screening. You may need this if you are a current or former smoker.  Osteoporosis. You may be screened starting at age 16 if you are at high risk. Talk with your health care provider about your test results, treatment options, and if necessary, the need for more tests. Vaccines  Your health care provider may recommend certain vaccines, such  as:  Influenza vaccine. This is recommended every year.  Tetanus, diphtheria, and acellular pertussis (Tdap, Td) vaccine. You may need a Td booster every 10 years.  Zoster vaccine. You may need this after age 27.  Pneumococcal 13-valent conjugate  (PCV13) vaccine. One dose is recommended after age 19.  Pneumococcal polysaccharide (PPSV23) vaccine. One dose is recommended after age 36. Talk to your health care provider about which screenings and vaccines you need and how often you need them. This information is not intended to replace advice given to you by your health care provider. Make sure you discuss any questions you have with your health care provider. Document Released: 12/10/2015 Document Revised: 08/02/2016 Document Reviewed: 09/14/2015 Elsevier Interactive Patient Education  2017 [employer] Prevention in the Home Falls can cause injuries. They can happen to people of all ages. There are many things you can do to make your home safe and to help prevent falls. What can I do on the outside of my home?  Regularly fix the edges of walkways and driveways and fix any cracks.  Remove anything that might make you trip as you walk through a door, such as a raised step or threshold.  Trim any bushes or trees on the path to your home.  Use bright outdoor lighting.  Clear any walking paths of anything that might make someone trip, such as rocks or tools.  Regularly check to see if handrails are loose or broken. Make sure that both sides of any steps have handrails.  Any raised decks and porches should have guardrails on the edges.  Have any leaves, snow, or ice cleared regularly.  Use sand or salt on walking paths during winter.  Clean up any spills in your garage right away. This includes oil or grease spills. What can I do in the bathroom?  Use night lights.  Install grab bars by the toilet and in the tub and shower. Do not use towel bars as grab bars.  Use non-skid mats or decals in the tub or shower.  If you need to sit down in the shower, use a plastic, non-slip stool.  Keep the floor dry. Clean up any water that spills on the floor as soon as it happens.  Remove soap buildup in the tub or shower  regularly.  Attach bath mats securely with double-sided non-slip rug tape.  Do not have throw rugs and other things on the floor that can make you trip. What can I do in the bedroom?  Use night lights.  Make sure that you have a light by your bed that is easy to reach.  Do not use any sheets or blankets that are too big for your bed. They should not hang down onto the floor.  Have a firm chair that has side arms. You can use this for support while you get dressed.  Do not have throw rugs and other things on the floor that can make you trip. What can I do in the kitchen?  Clean up any spills right away.  Avoid walking on wet floors.  Keep items that you use a lot in easy-to-reach places.  If you need to reach something above you, use a strong step stool that has a grab bar.  Keep electrical cords out of the way.  Do not use floor polish or wax that makes floors slippery. If you must use wax, use non-skid floor wax.  Do not have throw rugs and other things on the  floor that can make you trip. What can I do with my stairs?  Do not leave any items on the stairs.  Make sure that there are handrails on both sides of the stairs and use them. Fix handrails that are broken or loose. Make sure that handrails are as long as the stairways.  Check any carpeting to make sure that it is firmly attached to the stairs. Fix any carpet that is loose or worn.  Avoid having throw rugs at the top or bottom of the stairs. If you do have throw rugs, attach them to the floor with carpet tape.  Make sure that you have a light switch at the top of the stairs and the bottom of the stairs. If you do not have them, ask someone to add them for you. What else can I do to help prevent falls?  Wear shoes that:  Do not have high heels.  Have rubber bottoms.  Are comfortable and fit you well.  Are closed at the toe. Do not wear sandals.  If you use a stepladder:  Make sure that it is fully  opened. Do not climb a closed stepladder.  Make sure that both sides of the stepladder are locked into place.  Ask someone to hold it for you, if possible.  Clearly mark and make sure that you can see:  Any grab bars or handrails.  First and last steps.  Where the edge of each step is.  Use tools that help you move around (mobility aids) if they are needed. These include:  Canes.  Walkers.  Scooters.  Crutches.  Turn on the lights when you go into a dark area. Replace any light bulbs as soon as they burn out.  Set up your furniture so you have a clear path. Avoid moving your furniture around.  If any of your floors are uneven, fix them.  If there are any pets around you, be aware of where they are.  Review your medicines with your doctor. Some medicines can make you feel dizzy. This can increase your chance of falling. Ask your doctor what other things that you can do to help prevent falls. This information is not intended to replace advice given to you by your health care provider. Make sure you discuss any questions you have with your health care provider. Document Released: 09/09/2009 Document Revised: 04/20/2016 Document Reviewed: 12/18/2014 Elsevier Interactive Patient Education  2017 [employer].

## 2019-06-05 LAB — HEPATITIS C ANTIBODY: Hep C Virus Ab: <0.1 s/co ratio (ref 0.0–0.9)

## 2019-06-09 ENCOUNTER — Telehealth: Payer: Self-pay

## 2019-06-09 NOTE — Telephone Encounter (Signed)
I left the pt a message that the office needed him to return for additional  blood work that needed to be done at his visit

## 2019-06-11 ENCOUNTER — Encounter: Payer: Self-pay | Admitting: Internal Medicine

## 2019-06-12 ENCOUNTER — Telehealth: Payer: Self-pay | Admitting: Internal Medicine

## 2019-06-12 DIAGNOSIS — I10 Essential (primary) hypertension: Secondary | ICD-10-CM | POA: Diagnosis not present

## 2019-06-13 LAB — CBC
Hematocrit: 44 % (ref 37.5–51.0)
Hemoglobin: 14.7 g/dL (ref 13.0–17.7)
MCH: 27.9 pg (ref 26.6–33.0)
MCHC: 33.4 g/dL (ref 31.5–35.7)
MCV: 84 fL (ref 79–97)
Platelets: 184 10*3/uL (ref 150–450)
RBC: 5.26 x10E6/uL (ref 4.14–5.80)
RDW: 13.3 % (ref 11.6–15.4)
WBC: 6.2 10*3/uL (ref 3.4–10.8)

## 2019-06-13 LAB — CMP14+EGFR
ALT: 18 IU/L (ref 0–44)
AST: 25 IU/L (ref 0–40)
Albumin/Globulin Ratio: 1.5 (ref 1.2–2.2)
Albumin: 4.3 g/dL (ref 3.8–4.8)
Alkaline Phosphatase: 67 IU/L (ref 39–117)
BUN/Creatinine Ratio: 17 (ref 10–24)
BUN: 15 mg/dL (ref 8–27)
Bilirubin Total: 0.5 mg/dL (ref 0.0–1.2)
CO2: 23 mmol/L (ref 20–29)
Calcium: 9.9 mg/dL (ref 8.6–10.2)
Chloride: 103 mmol/L (ref 96–106)
Creatinine, Ser: 0.9 mg/dL (ref 0.76–1.27)
GFR calc Af Amer: 101 mL/min/{1.73_m2} (ref >59)
GFR calc non Af Amer: 87 mL/min/{1.73_m2} (ref >59)
Globulin, Total: 2.9 g/dL (ref 1.5–4.5)
Glucose: 106 mg/dL — ABNORMAL HIGH (ref 65–99)
Potassium: 4.8 mmol/L (ref 3.5–5.2)
Sodium: 139 mmol/L (ref 134–144)
Total Protein: 7.2 g/dL (ref 6.0–8.5)

## 2019-06-13 LAB — LIPID PANEL
Chol/HDL Ratio: 3.6 ratio (ref 0.0–5.0)
Cholesterol, Total: 236 mg/dL — ABNORMAL HIGH (ref 100–199)
HDL: 66 mg/dL (ref >39)
LDL Calculated: 153 mg/dL — ABNORMAL HIGH (ref 0–99)
Triglycerides: 87 mg/dL (ref 0–149)
VLDL Cholesterol Cal: 17 mg/dL (ref 5–40)

## 2019-08-25 ENCOUNTER — Encounter: Payer: Self-pay | Admitting: Internal Medicine

## 2019-08-29 ENCOUNTER — Other Ambulatory Visit: Payer: Self-pay | Admitting: Internal Medicine

## 2019-09-03 ENCOUNTER — Other Ambulatory Visit: Payer: Self-pay | Admitting: Internal Medicine

## 2019-09-04 ENCOUNTER — Other Ambulatory Visit: Payer: Self-pay

## 2019-09-04 ENCOUNTER — Ambulatory Visit (INDEPENDENT_AMBULATORY_CARE_PROVIDER_SITE_OTHER): Payer: PPO | Admitting: Internal Medicine

## 2019-09-04 ENCOUNTER — Encounter: Payer: Self-pay | Admitting: Internal Medicine

## 2019-09-04 VITALS — BP 138/80 | HR 70 | Temp 97.4°F | Ht 74.0 in | Wt 236.8 lb

## 2019-09-04 DIAGNOSIS — E6609 Other obesity due to excess calories: Secondary | ICD-10-CM

## 2019-09-04 DIAGNOSIS — R635 Abnormal weight gain: Secondary | ICD-10-CM | POA: Diagnosis not present

## 2019-09-04 DIAGNOSIS — I10 Essential (primary) hypertension: Secondary | ICD-10-CM

## 2019-09-04 DIAGNOSIS — Z6830 Body mass index (BMI) 30.0-30.9, adult: Secondary | ICD-10-CM | POA: Diagnosis not present

## 2019-09-04 MED ORDER — GABAPENTIN 300 MG PO CAPS: 100.0000 mg | ORAL_CAPSULE | Freq: Every day | ORAL | 1 refills | Status: DC

## 2019-09-04 NOTE — Patient Instructions (Signed)
Preventing Influenza, Adult Influenza, more commonly known as "the flu," is a viral infection that mainly affects the respiratory tract. The respiratory tract includes structures that help you breathe, such as the lungs, nose, and throat. The flu causes many common cold symptoms, as well as a high fever and body aches. The flu spreads easily from person to person (is contagious). The flu is most common from December through March. This is called flu season.You can catch the flu virus by:  Breathing in droplets from an infected person's cough or sneeze.  Touching something that was recently contaminated with the virus and then touching your mouth, nose, or eyes. What can I do to lower my risk?        You can decrease your risk of getting the flu by:  Getting a flu shot (influenza vaccination) every year. This is the best way to prevent the flu. A flu shot is recommended for everyone age 6 months and older. ? It is best to get a flu shot in the fall, as soon as it is available. Getting a flu shot during winter or spring instead is still a good idea. Flu season can last into early spring. ? Preventing the flu through vaccination requires getting a new flu shot every year. This is because the flu virus changes slightly (mutates) from one year to the next. Even if a flu shot does not completely protect you from all flu virus mutations, it can reduce the severity of your illness and prevent dangerous complications of the flu. ? If you are pregnant, you can and should get a flu shot. ? If you have had a reaction to the shot in the past or if you are allergic to eggs, check with your health care provider before getting a flu shot. ? Sometimes the vaccine is available as a nasal spray. In some years, the nasal spray has not been as effective against the flu virus. Check with your health care provider if you have questions about this.  Practicing good health habits. This is especially important during  flu season. ? Avoid contact with people who are sick with flu or cold symptoms. ? Wash your hands with soap and water often. If soap and water are not available, use alcohol-based hand sanitizer. ? Avoid touching your hands to your face, especially when you have not washed your hands recently. ? Use a disinfectant to clean surfaces at home and at work that may be contaminated with the flu virus. ? Keep your body's disease-fighting system (immune system) in good shape by eating a healthy diet, drinking plenty of fluids, getting enough sleep, and exercising regularly. If you do get the flu, avoid spreading it to others by:  Staying home until your symptoms have been gone for at least one day.  Covering your mouth and nose when you cough or sneeze.  Avoiding close contact with others, especially babies and elderly people. Why are these changes important? Getting a flu shot and practicing good health habits protects you as well as other people. If you get the flu, your friends, family, and co-workers are also at risk of getting it, because it spreads so easily to others. Each year, about 2 out of every 10 people get the flu. Having the flu can lead to complications, such as pneumonia, ear infection, and sinus infection. The flu also can be deadly, especially for babies, people older than age 65, and people who have serious long-term diseases. How is this treated? Most   people recover from the flu by resting at home and drinking plenty of fluids. However, a prescription antiviral medicine may reduce your flu symptoms and may make your flu go away sooner. This medicine must be started within a few days of getting flu symptoms. You can talk with your health care provider about whether you need an antiviral medicine. Antiviral medicine may be prescribed for people who are at risk for more serious flu symptoms. This includes people who:  Are older than age 65.  Are pregnant.  Have a condition that  makes the flu worse or more dangerous. Where to find more information  Centers for Disease Control and Prevention: www.cdc.gov/flu/index.htm  Flu.gov: www.flu.gov/prevention-vaccination  American Academy [employer]: familydoctor.org/familydoctor/en/kids/vaccines/preventing-the-flu.html Contact a health care provider if:  You have influenza and you develop new symptoms.  You have: ? Chest pain. ? Diarrhea. ? A fever.  Your cough gets worse, or you produce more mucus. Summary  The best way to prevent the flu is to get a flu shot every year in the fall.  Even if you get the flu after you have received the yearly vaccine, your flu may be milder and go away sooner because of your flu shot.  If you get the flu, antiviral medicines that are started with a few days of symptoms may reduce your flu symptoms and may make your flu go away sooner.  You can also help prevent the flu by practicing good health habits. This information is not intended to replace advice given to you by your health care provider. Make sure you discuss any questions you have with your health care provider. Document Released: 11/28/2015 Document Revised: 10/26/2017 Document Reviewed: 07/22/2016 Elsevier Patient Education  2020 [employer]  

## 2019-09-05 NOTE — Progress Notes (Signed)
Subjective:     Patient ID: Marc [name] , male    DOB: 1951-02-11 , 68 y.o.   MRN: [id number]   Chief Complaint  Patient presents with  . Hypertension    HPI  Hypertension This is a chronic problem. The current episode started more than 1 year ago. The problem has been gradually improving since onset. The problem is controlled. Pertinent negatives include no blurred vision, chest pain, palpitations or shortness of breath. Past treatments include angiotensin blockers. The current treatment provides moderate improvement. Compliance problems include diet.      Past Medical History:  Diagnosis Date  . History of cocaine abuse ([place])   . Hypertension   . Osteoarthritis      Family History  Problem Relation Age of Onset  . Stroke Mother 58  . Hypertension Mother   . Healthy Father      Current Outpatient Medications:  .  telmisartan (MICARDIS) 20 MG tablet, TAKE 1 TABLET BY MOUTH EVERY DAY, Disp: 90 tablet, Rfl: 1 .  VITAMIN D, CHOLECALCIFEROL, PO, Take by mouth. Pt takes 500 units daily, Disp: , Rfl:  .  gabapentin (NEURONTIN) 300 MG capsule, TAKE 1 CAPSULE BY MOUTH EVERY DAY AS NEEDED, Disp: 30 capsule, Rfl: 1 .  gabapentin (NEURONTIN) 300 MG capsule, Take 1 capsule (300 mg total) by mouth at bedtime., Disp: 90 capsule, Rfl: 1   No Known Allergies   Review of Systems  Constitutional: Negative.   Eyes: Negative for blurred vision.  Respiratory: Negative.  Negative for shortness of breath.   Cardiovascular: Negative.  Negative for chest pain and palpitations.  Gastrointestinal: Negative.   Neurological: Negative.   Psychiatric/Behavioral: Negative.      Today's Vitals   09/04/19 1409  BP: 138/80  Pulse: 70  Temp: (!) 97.4 F (36.3 C)  TempSrc: Oral  SpO2: 99%  Weight: 236 lb 12.8 oz (107.4 kg)  Height: 6\' 2"  (1.88 m)   Body mass index is 30.4 kg/m.   Objective:  Physical Exam Vitals signs and nursing note reviewed.  Constitutional:      Appearance: Normal  appearance.  Cardiovascular:     Rate and Rhythm: Normal rate and regular rhythm.     Heart sounds: Normal heart sounds.  Pulmonary:     Effort: Pulmonary effort is normal.     Breath sounds: Normal breath sounds.  Skin:    General: Skin is warm.  Neurological:     General: No focal deficit present.     Mental Status: He is alert.  Psychiatric:        Mood and Affect: Mood normal.         Assessment And Plan:     1. Essential hypertension, benign  Chronic, fair control. He is aware that optimal bp is less than 120/80. He will continue with current meds for now. He is encouraged to cut back on intake of packaged foods which tend to be high in sodium.   2. Weight gain  He is aware of weight gain. He is encouraged to limit evening meals to meat/fish and veggies. He will try this dietary regimen.   3. Class 1 obesity due to excess calories with serious comorbidity and body mass index (BMI) of 30.0 to 30.9 in adult  He was made aware of 5 pound weight gain. He admits to eating large portions of Wheat Thins. He is encouraged to cut back to single serving, he will try. He is already back in the gym working out  on a regular basis.   Gwynneth Aliment, MD    THE PATIENT IS ENCOURAGED TO PRACTICE SOCIAL DISTANCING DUE TO THE COVID-19 PANDEMIC.

## 2019-10-24 DIAGNOSIS — Z20828 Contact with and (suspected) exposure to other viral communicable diseases: Secondary | ICD-10-CM | POA: Diagnosis not present

## 2019-12-08 ENCOUNTER — Ambulatory Visit (INDEPENDENT_AMBULATORY_CARE_PROVIDER_SITE_OTHER): Payer: PPO | Admitting: Internal Medicine

## 2019-12-08 ENCOUNTER — Other Ambulatory Visit: Payer: Self-pay

## 2019-12-08 ENCOUNTER — Encounter: Payer: Self-pay | Admitting: Internal Medicine

## 2019-12-08 VITALS — BP 116/78 | HR 71 | Temp 97.7°F | Ht 74.0 in | Wt 236.2 lb

## 2019-12-08 DIAGNOSIS — E6609 Other obesity due to excess calories: Secondary | ICD-10-CM

## 2019-12-08 DIAGNOSIS — I10 Essential (primary) hypertension: Secondary | ICD-10-CM | POA: Diagnosis not present

## 2019-12-08 DIAGNOSIS — Z6830 Body mass index (BMI) 30.0-30.9, adult: Secondary | ICD-10-CM

## 2019-12-08 MED ORDER — TELMISARTAN 20 MG PO TABS: 20.0000 mg | ORAL_TABLET | Freq: Every day | ORAL | 1 refills | Status: DC

## 2019-12-08 NOTE — Progress Notes (Signed)
This visit occurred during the SARS-CoV-2 public health emergency.  Safety protocols were in place, including screening questions prior to the visit, additional usage of staff PPE, and extensive cleaning of exam room while observing appropriate contact time as indicated for disinfecting solutions.  Subjective:     Patient ID: Marc [name] , male    DOB: Aug 27, 1951 , 69 y.o.   MRN: [id number]   Chief Complaint  Patient presents with  . Hypertension    HPI  Hypertension This is a chronic problem. The current episode started more than 1 year ago. The problem has been gradually improving since onset. The problem is controlled. Pertinent negatives include no blurred vision, chest pain, headaches or shortness of breath. Neck pain: he has been eval by Dr. Rolena Infante. He has had some relief of his sx. Now on higher dose of gabapentin. He has gone to PT. He has had Nerve conduction testing by Dr. Nelva Bush. He does feel he is starting to improve.  Past treatments include angiotensin blockers. The current treatment provides moderate improvement. There are no compliance problems.      Past Medical History:  Diagnosis Date  . History of cocaine abuse ([place])   . Hypertension   . Osteoarthritis      Family History  Problem Relation Age of Onset  . Stroke Mother 2  . Hypertension Mother   . Healthy Father      Current Outpatient Medications:  .  gabapentin (NEURONTIN) 300 MG capsule, Take 1 capsule (300 mg total) by mouth at bedtime., Disp: 90 capsule, Rfl: 1 .  magnesium gluconate (MAGONATE) 500 MG tablet, Take 500 mg by mouth daily., Disp: , Rfl:  .  telmisartan (MICARDIS) 20 MG tablet, Take 1 tablet (20 mg total) by mouth daily., Disp: 90 tablet, Rfl: 1 .  VITAMIN D, CHOLECALCIFEROL, PO, Take by mouth. 2 times per day, Disp: , Rfl:    No Known Allergies   Review of Systems  Constitutional: Negative.   Eyes: Negative for blurred vision.  Respiratory: Negative.  Negative for shortness of breath.    Cardiovascular: Negative.  Negative for chest pain.  Gastrointestinal: Negative.   Musculoskeletal: Neck pain: he has been eval by Dr. Rolena Infante. He has had some relief of his sx. Now on higher dose of gabapentin. He has gone to PT. He has had Nerve conduction testing by Dr. Nelva Bush. He does feel he is starting to improve.   Neurological: Negative.  Negative for headaches.  Psychiatric/Behavioral: Negative.      Today's Vitals   12/08/19 1410  BP: 116/78  Pulse: 71  Temp: 97.7 F (36.5 C)  TempSrc: Oral  Weight: 236 lb 3.2 oz (107.1 kg)  Height: _0  (1.88 m)  PainSc: 0-No pain   Body mass index is 30.33 kg/m.   Objective:  Physical Exam Vitals and nursing note reviewed.  Constitutional:      Appearance: Normal appearance.  HENT:     Head: Normocephalic and atraumatic.  Cardiovascular:     Rate and Rhythm: Normal rate and regular rhythm.     Heart sounds: Normal heart sounds.  Pulmonary:     Effort: Pulmonary effort is normal.     Breath sounds: Normal breath sounds.  Skin:    General: Skin is warm.  Neurological:     General: No focal deficit present.     Mental Status: He is alert.  Psychiatric:        Mood and Affect: Mood normal.  Assessment And Plan:     1. Essential hypertension, benign  Chronic, well controlled.  He will continue with current meds.  He will rto in six months for his next six months for re-evaluation. I will check his renal function today.   - BMP8+EGFR  2. Class 1 obesity due to excess calories with serious comorbidity and body mass index (BMI) of 30.0 to 30.9 in adult  He is encouraged to continue with regular exercise regimen. He is advised to strive for BMI less than 27 to decrease cardiac risk.  [name], MD    THE PATIENT IS ENCOURAGED TO PRACTICE SOCIAL DISTANCING DUE TO THE COVID-19 PANDEMIC.

## 2019-12-09 LAB — BMP8+EGFR
BUN/Creatinine Ratio: 14 (ref 10–24)
BUN: 13 mg/dL (ref 8–27)
CO2: 25 mmol/L (ref 20–29)
Calcium: 9.7 mg/dL (ref 8.6–10.2)
Chloride: 103 mmol/L (ref 96–106)
Creatinine, Ser: 0.95 mg/dL (ref 0.76–1.27)
GFR calc Af Amer: 95 mL/min/{1.73_m2} (ref >59)
GFR calc non Af Amer: 82 mL/min/{1.73_m2} (ref >59)
Glucose: 80 mg/dL (ref 65–99)
Potassium: 5.4 mmol/L — ABNORMAL HIGH (ref 3.5–5.2)
Sodium: 140 mmol/L (ref 134–144)

## 2019-12-19 ENCOUNTER — Ambulatory Visit: Payer: PPO | Attending: Internal Medicine

## 2019-12-19 DIAGNOSIS — Z23 Encounter for immunization: Secondary | ICD-10-CM

## 2019-12-22 NOTE — Progress Notes (Signed)
   Covid-19 Vaccination Clinic  Name:  Marc [name]    MRN: [id number] DOB: 09/19/1951  12/19/2019  [name] was observed post Covid-19 immunization for 15 minutes without incidence. He was provided with Vaccine Information Sheet and instruction to access the V-Safe system.   [name] was instructed to call 911 with any severe reactions post vaccine: Marland Kitchen Difficulty breathing  . Swelling of your face and throat  . A fast heartbeat  . A bad rash all over your body  . Dizziness and weakness    Immunizations Administered    Name Date Dose VIS Date Route   Moderna COVID-19 Vaccine 12/19/2019 10:18 AM 0.5 mL 10/28/2019 Intramuscular   Manufacturer: Moderna   Lot: [id number]   NDC: 84417-127-87

## 2020-01-06 ENCOUNTER — Ambulatory Visit: Payer: Self-pay

## 2020-01-16 ENCOUNTER — Ambulatory Visit: Payer: PPO

## 2020-01-18 ENCOUNTER — Ambulatory Visit: Payer: PPO | Attending: Internal Medicine

## 2020-01-18 DIAGNOSIS — Z23 Encounter for immunization: Secondary | ICD-10-CM

## 2020-01-18 NOTE — Progress Notes (Signed)
   Covid-19 Vaccination Clinic  Name:  Debbie Bellucci    MRN: [id number] DOB: 1951-05-14  01/18/2020  Mr. Wayment was observed post Covid-19 immunization for 15 minutes without incidence. He was provided with Vaccine Information Sheet and instruction to access the V-Safe system.   Mr. Holland was instructed to call 911 with any severe reactions post vaccine: Marland Kitchen Difficulty breathing  . Swelling of your face and throat  . A fast heartbeat  . A bad rash all over your body  . Dizziness and weakness    Immunizations Administered    Name Date Dose VIS Date Route   Moderna COVID-19 Vaccine 01/18/2020  5:31 PM 0.5 mL 10/28/2019 Intramuscular   Manufacturer: Moderna   Lot: [id number]   NDC: 67544-920-10

## 2020-01-19 ENCOUNTER — Ambulatory Visit: Payer: Self-pay

## 2020-01-23 ENCOUNTER — Ambulatory Visit: Payer: PPO

## 2020-03-16 ENCOUNTER — Other Ambulatory Visit: Payer: Self-pay | Admitting: Internal Medicine

## 2020-03-17 NOTE — Telephone Encounter (Signed)
Refill request: Gabapentin

## 2020-05-11 ENCOUNTER — Encounter: Payer: Self-pay | Admitting: Internal Medicine

## 2020-06-09 ENCOUNTER — Ambulatory Visit (INDEPENDENT_AMBULATORY_CARE_PROVIDER_SITE_OTHER): Payer: PPO

## 2020-06-09 ENCOUNTER — Encounter: Payer: Self-pay | Admitting: Internal Medicine

## 2020-06-09 ENCOUNTER — Ambulatory Visit (INDEPENDENT_AMBULATORY_CARE_PROVIDER_SITE_OTHER): Payer: PPO | Admitting: Internal Medicine

## 2020-06-09 ENCOUNTER — Other Ambulatory Visit: Payer: Self-pay

## 2020-06-09 VITALS — BP 128/80 | HR 66 | Temp 97.6°F | Ht 74.0 in | Wt 233.8 lb

## 2020-06-09 DIAGNOSIS — Z0000 Encounter for general adult medical examination without abnormal findings: Secondary | ICD-10-CM

## 2020-06-09 DIAGNOSIS — E559 Vitamin D deficiency, unspecified: Secondary | ICD-10-CM

## 2020-06-09 DIAGNOSIS — Z6830 Body mass index (BMI) 30.0-30.9, adult: Secondary | ICD-10-CM | POA: Diagnosis not present

## 2020-06-09 DIAGNOSIS — I10 Essential (primary) hypertension: Secondary | ICD-10-CM

## 2020-06-09 DIAGNOSIS — R351 Nocturia: Secondary | ICD-10-CM

## 2020-06-09 DIAGNOSIS — E6609 Other obesity due to excess calories: Secondary | ICD-10-CM | POA: Diagnosis not present

## 2020-06-09 NOTE — Patient Instructions (Signed)
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
## 2020-06-09 NOTE — Progress Notes (Signed)
This visit occurred during the SARS-CoV-2 public health emergency.  Safety protocols were in place, including screening questions prior to the visit, additional usage of staff PPE, and extensive cleaning of exam room while observing appropriate contact time as indicated for disinfecting solutions.  Subjective:     Patient ID: Marc [name] , male    DOB: [birth year] , 69 y.o.   MRN: [id number]   Chief Complaint  Patient presents with  . Annual Exam  . Hypertension    HPI  He presents today for a full physical exam. He denies having any specific concerns or complaints at this time.   Hypertension This is a chronic problem. The current episode started more than 1 year ago. The problem has been gradually improving since onset. The problem is controlled. Pertinent negatives include no blurred vision, chest pain, headaches, palpitations or shortness of breath. Past treatments include angiotensin blockers. The current treatment provides moderate improvement. Compliance problems include diet.      Past Medical History:  Diagnosis Date  . History of cocaine abuse ([place])   . Hypertension   . Osteoarthritis      Family History  Problem Relation Age of Onset  . Stroke Mother 67  . Hypertension Mother   . Healthy Father      Current Outpatient Medications:  .  gabapentin (NEURONTIN) 300 MG capsule, TAKE 1 CAPSULE BY MOUTH AT BEDTIME, Disp: 90 capsule, Rfl: 1 .  magnesium gluconate (MAGONATE) 500 MG tablet, Take 500 mg by mouth daily., Disp: , Rfl:  .  telmisartan (MICARDIS) 20 MG tablet, Take 1 tablet (20 mg total) by mouth daily., Disp: 90 tablet, Rfl: 1 .  VITAMIN D, CHOLECALCIFEROL, PO, Take by mouth. 2 times per day, Disp: , Rfl:    No Known Allergies   Men's preventive visit. Patient Health Questionnaire (PHQ-2) is    Clinical Support from 06/09/2020 in [facility]  PHQ-2 Total Score 0    . Patient is on a healthy diet. Marital status: Single. Relevant  history for alcohol use is:  Social History   Substance and Sexual Activity  Alcohol Use Not Currently   Comment: beer a week maybe  . Relevant history for tobacco use is:  Social History   Tobacco Use  Smoking Status Never Smoker  Smokeless Tobacco Never Used  .   Review of Systems  Constitutional: Negative.   HENT: Negative.   Eyes: Negative.  Negative for blurred vision.  Respiratory: Negative.  Negative for shortness of breath.   Cardiovascular: Negative.  Negative for chest pain and palpitations.  Gastrointestinal: Negative.   Genitourinary: Negative.   Musculoskeletal: Negative.   Skin: Negative.   Neurological: Negative.  Negative for headaches.  Endo/Heme/Allergies: Negative.   Psychiatric/Behavioral: Negative.     Today's Vitals   06/09/20 1056  BP: 128/80  Pulse: 66  Temp: 97.6 F (36.4 C)  Weight: 233 lb 12.8 oz (106.1 kg)  Height: 6' 2"  (1.88 m)  PainSc: 0-No pain   Body mass index is 30.02 kg/m.   Objective:  Physical Exam Vitals and nursing note reviewed.  Constitutional:      Appearance: Normal appearance.  HENT:     Head: Normocephalic and atraumatic.     Right Ear: Tympanic membrane, ear canal and external ear normal.     Left Ear: Tympanic membrane, ear canal and external ear normal.     Nose: Nose normal.     Mouth/Throat:     Mouth: Mucous membranes are moist.  Pharynx: Oropharynx is clear.  Eyes:     Extraocular Movements: Extraocular movements intact.     Conjunctiva/sclera: Conjunctivae normal.     Pupils: Pupils are equal, round, and reactive to light.  Cardiovascular:     Rate and Rhythm: Normal rate and regular rhythm.     Pulses: Normal pulses.     Heart sounds: Normal heart sounds.  Pulmonary:     Effort: Pulmonary effort is normal.     Breath sounds: Normal breath sounds.  Chest:     Breasts:        Right: Normal. No swelling, bleeding, inverted nipple, mass or nipple discharge.        Left: Normal. No swelling,  bleeding, inverted nipple, mass or nipple discharge.  Abdominal:     General: Abdomen is flat. Bowel sounds are normal.     Palpations: Abdomen is soft.  Genitourinary:    Prostate: Normal.     Rectum: Normal. Guaiac result negative.  Musculoskeletal:        General: Normal range of motion.     Cervical back: Normal range of motion and neck supple.  Skin:    General: Skin is warm.  Neurological:     General: No focal deficit present.     Mental Status: He is alert.  Psychiatric:        Mood and Affect: Mood normal.        Behavior: Behavior normal.         Assessment And Plan:     1. Routine general medical examination at health care facility  A full exam was performed.  DRE performed, stool heme negative. PATIENT IS ADVISED TO GET 30-45 MINUTES REGULAR EXERCISE NO LESS THAN FOUR TO FIVE DAYS PER WEEK - BOTH WEIGHTBEARING EXERCISES AND AEROBIC ARE RECOMMENDED.  PATIENT IS ADVISED TO FOLLOW A HEALTHY DIET WITH AT LEAST SIX FRUITS/VEGGIES PER DAY, DECREASE INTAKE OF RED MEAT, AND TO INCREASE FISH INTAKE TO TWO DAYS PER WEEK.  MEATS/FISH SHOULD NOT BE FRIED, BAKED OR BROILED IS PREFERABLE.  I SUGGEST WEARING SPF 50 SUNSCREEN ON EXPOSED PARTS AND ESPECIALLY WHEN IN THE DIRECT SUNLIGHT FOR AN EXTENDED PERIOD OF TIME.  PLEASE AVOID FAST FOOD RESTAURANTS AND INCREASE YOUR WATER INTAKE.   2. Essential hypertension, benign  Chronic, well controlled. He will continue with current meds. EKG performed, NSR w/ first degree AV block, no acute changes. He is encouraged to avoid adding salt to his foods.   - EKG 12-Lead - CBC - Hemoglobin A1c - CMP14+EGFR - Lipid panel - TSH  3. Vitamin D deficiency  I WILL CHECK A VIT D LEVEL AND SUPPLEMENT AS NEEDED.  ALSO ENCOURAGED TO SPEND 15 MINUTES IN THE SUN DAILY.  - VITAMIN D 25 Hydroxy (Vit-D Deficiency, Fractures)  4. Nocturia  DRE performed, I will check PSA.   - PSA  5. Class 1 obesity due to excess calories with serious comorbidity  and body mass index (BMI) of 30.0 to 30.9 in adult  His BMI is acceptable for his demographic. Encouraged to aim for at least 150 minutes of exercise per week.    Patient was given opportunity to ask questions. Patient verbalized understanding of the plan and was able to repeat key elements of the plan. All questions were answered to their satisfaction.   [name], MD    THE PATIENT IS ENCOURAGED TO PRACTICE SOCIAL DISTANCING DUE TO THE COVID-19 PANDEMIC.

## 2020-06-09 NOTE — Patient Instructions (Signed)
[name] , Thank you for taking time to come for your Medicare Wellness Visit. I appreciate your ongoing commitment to your health goals. Please review the following plan we discussed and let me know if I can assist you in the future.   Screening recommendations/referrals: Colonoscopy: completed 03/28/2016, due 03/28/2026 Recommended yearly ophthalmology/optometry visit for glaucoma screening and checkup Recommended yearly dental visit for hygiene and checkup  Vaccinations: Influenza vaccine: completed 08/14/2019, due 06/27/2020 Pneumococcal vaccine: completed 06/08/2019 Tdap vaccine: completed 06/04/2019, due 06/03/2029 Shingles vaccine: discussed   Covid-19: 01/18/2020, 12/19/2019  Advanced directives: Advance directive discussed with you today. I have provided a copy for you to complete at home and have notarized. Once this is complete please bring a copy in to our office so we can scan it into your chart.  Conditions/risks identified: none  Next appointment: Follow up in one year for your annual wellness visit. 06/15/2021 at 11:00  Preventive Care 65 Years and Older, Male Preventive care refers to lifestyle choices and visits with your health care provider that can promote health and wellness. What does preventive care include?  A yearly physical exam. This is also called an annual well check.  Dental exams once or twice a year.  Routine eye exams. Ask your health care provider how often you should have your eyes checked.  Personal lifestyle choices, including:  Daily care of your teeth and gums.  Regular physical activity.  Eating a healthy diet.  Avoiding tobacco and drug use.  Limiting alcohol use.  Practicing safe sex.  Taking low doses of aspirin every day.  Taking vitamin and mineral supplements as recommended by your health care provider. What happens during an annual well check? The services and screenings done by your health care provider during your annual well check  will depend on your age, overall health, lifestyle risk factors, and family history of disease. Counseling  Your health care provider may ask you questions about your:  Alcohol use.  Tobacco use.  Drug use.  Emotional well-being.  Home and relationship well-being.  Sexual activity.  Eating habits.  History of falls.  Memory and ability to understand (cognition).  Work and work Astronomer. Screening  You may have the following tests or measurements:  Height, weight, and BMI.  Blood pressure.  Lipid and cholesterol levels. These may be checked every 5 years, or more frequently if you are over 39 years old.  Skin check.  Lung cancer screening. You may have this screening every year starting at age 50 if you have a 30-pack-year history of smoking and currently smoke or have quit within the past 15 years.  Fecal occult blood test (FOBT) of the stool. You may have this test every year starting at age 2.  Flexible sigmoidoscopy or colonoscopy. You may have a sigmoidoscopy every 5 years or a colonoscopy every 10 years starting at age 102.  Prostate cancer screening. Recommendations will vary depending on your family history and other risks.  Hepatitis C blood test.  Hepatitis B blood test.  Sexually transmitted disease (STD) testing.  Diabetes screening. This is done by checking your blood sugar (glucose) after you have not eaten for a while (fasting). You may have this done every 1-3 years.  Abdominal aortic aneurysm (AAA) screening. You may need this if you are a current or former smoker.  Osteoporosis. You may be screened starting at age 24 if you are at high risk. Talk with your health care provider about your test results, treatment options,  and if necessary, the need for more tests. Vaccines  Your health care provider may recommend certain vaccines, such as:  Influenza vaccine. This is recommended every year.  Tetanus, diphtheria, and acellular pertussis  (Tdap, Td) vaccine. You may need a Td booster every 10 years.  Zoster vaccine. You may need this after age 51.  Pneumococcal 13-valent conjugate (PCV13) vaccine. One dose is recommended after age 3.  Pneumococcal polysaccharide (PPSV23) vaccine. One dose is recommended after age 39. Talk to your health care provider about which screenings and vaccines you need and how often you need them. This information is not intended to replace advice given to you by your health care provider. Make sure you discuss any questions you have with your health care provider. Document Released: 12/10/2015 Document Revised: 08/02/2016 Document Reviewed: 09/14/2015 Elsevier Interactive Patient Education  2017 [place] Prevention in the Home Falls can cause injuries. They can happen to people of all ages. There are many things you can do to make your home safe and to help prevent falls. What can I do on the outside of my home?  Regularly fix the edges of walkways and driveways and fix any cracks.  Remove anything that might make you trip as you walk through a door, such as a raised step or threshold.  Trim any bushes or trees on the path to your home.  Use bright outdoor lighting.  Clear any walking paths of anything that might make someone trip, such as rocks or tools.  Regularly check to see if handrails are loose or broken. Make sure that both sides of any steps have handrails.  Any raised decks and porches should have guardrails on the edges.  Have any leaves, snow, or ice cleared regularly.  Use sand or salt on walking paths during winter.  Clean up any spills in your garage right away. This includes oil or grease spills. What can I do in the bathroom?  Use night lights.  Install grab bars by the toilet and in the tub and shower. Do not use towel bars as grab bars.  Use non-skid mats or decals in the tub or shower.  If you need to sit down in the shower, use a plastic, non-slip  stool.  Keep the floor dry. Clean up any water that spills on the floor as soon as it happens.  Remove soap buildup in the tub or shower regularly.  Attach bath mats securely with double-sided non-slip rug tape.  Do not have throw rugs and other things on the floor that can make you trip. What can I do in the bedroom?  Use night lights.  Make sure that you have a light by your bed that is easy to reach.  Do not use any sheets or blankets that are too big for your bed. They should not hang down onto the floor.  Have a firm chair that has side arms. You can use this for support while you get dressed.  Do not have throw rugs and other things on the floor that can make you trip. What can I do in the kitchen?  Clean up any spills right away.  Avoid walking on wet floors.  Keep items that you use a lot in easy-to-reach places.  If you need to reach something above you, use a strong step stool that has a grab bar.  Keep electrical cords out of the way.  Do not use floor polish or wax that makes floors slippery. If  you must use wax, use non-skid floor wax.  Do not have throw rugs and other things on the floor that can make you trip. What can I do with my stairs?  Do not leave any items on the stairs.  Make sure that there are handrails on both sides of the stairs and use them. Fix handrails that are broken or loose. Make sure that handrails are as long as the stairways.  Check any carpeting to make sure that it is firmly attached to the stairs. Fix any carpet that is loose or worn.  Avoid having throw rugs at the top or bottom of the stairs. If you do have throw rugs, attach them to the floor with carpet tape.  Make sure that you have a light switch at the top of the stairs and the bottom of the stairs. If you do not have them, ask someone to add them for you. What else can I do to help prevent falls?  Wear shoes that:  Do not have high heels.  Have rubber bottoms.  Are  comfortable and fit you well.  Are closed at the toe. Do not wear sandals.  If you use a stepladder:  Make sure that it is fully opened. Do not climb a closed stepladder.  Make sure that both sides of the stepladder are locked into place.  Ask someone to hold it for you, if possible.  Clearly mark and make sure that you can see:  Any grab bars or handrails.  First and last steps.  Where the edge of each step is.  Use tools that help you move around (mobility aids) if they are needed. These include:  Canes.  Walkers.  Scooters.  Crutches.  Turn on the lights when you go into a dark area. Replace any light bulbs as soon as they burn out.  Set up your furniture so you have a clear path. Avoid moving your furniture around.  If any of your floors are uneven, fix them.  If there are any pets around you, be aware of where they are.  Review your medicines with your doctor. Some medicines can make you feel dizzy. This can increase your chance of falling. Ask your doctor what other things that you can do to help prevent falls. This information is not intended to replace advice given to you by your health care provider. Make sure you discuss any questions you have with your health care provider. Document Released: 09/09/2009 Document Revised: 04/20/2016 Document Reviewed: 12/18/2014 Elsevier Interactive Patient Education  2017 [employer].

## 2020-06-10 ENCOUNTER — Encounter: Payer: Self-pay | Admitting: Internal Medicine

## 2020-06-10 LAB — CBC
Hematocrit: 45.2 % (ref 37.5–51.0)
Hemoglobin: 14.4 g/dL (ref 13.0–17.7)
MCH: 28 pg (ref 26.6–33.0)
MCHC: 31.9 g/dL (ref 31.5–35.7)
MCV: 88 fL (ref 79–97)
Platelets: 171 10*3/uL (ref 150–450)
RBC: 5.15 x10E6/uL (ref 4.14–5.80)
RDW: 13.2 % (ref 11.6–15.4)
WBC: 5.6 10*3/uL (ref 3.4–10.8)

## 2020-06-10 LAB — CMP14+EGFR
ALT: 15 IU/L (ref 0–44)
AST: 25 IU/L (ref 0–40)
Albumin/Globulin Ratio: 1.4 (ref 1.2–2.2)
Albumin: 4.2 g/dL (ref 3.8–4.8)
Alkaline Phosphatase: 69 IU/L (ref 48–121)
BUN/Creatinine Ratio: 18 (ref 10–24)
BUN: 18 mg/dL (ref 8–27)
Bilirubin Total: 0.5 mg/dL (ref 0.0–1.2)
CO2: 25 mmol/L (ref 20–29)
Calcium: 9.3 mg/dL (ref 8.6–10.2)
Chloride: 103 mmol/L (ref 96–106)
Creatinine, Ser: 0.99 mg/dL (ref 0.76–1.27)
GFR calc Af Amer: 89 mL/min/{1.73_m2} (ref >59)
GFR calc non Af Amer: 77 mL/min/{1.73_m2} (ref >59)
Globulin, Total: 3.1 g/dL (ref 1.5–4.5)
Glucose: 73 mg/dL (ref 65–99)
Potassium: 4.9 mmol/L (ref 3.5–5.2)
Sodium: 138 mmol/L (ref 134–144)
Total Protein: 7.3 g/dL (ref 6.0–8.5)

## 2020-06-10 LAB — VITAMIN D 25 HYDROXY (VIT D DEFICIENCY, FRACTURES): Vit D, 25-Hydroxy: 50.9 ng/mL (ref 30.0–100.0)

## 2020-06-10 LAB — LIPID PANEL
Chol/HDL Ratio: 3.3 ratio (ref 0.0–5.0)
Cholesterol, Total: 206 mg/dL — ABNORMAL HIGH (ref 100–199)
HDL: 62 mg/dL (ref >39)
LDL Chol Calc (NIH): 130 mg/dL — ABNORMAL HIGH (ref 0–99)
Triglycerides: 77 mg/dL (ref 0–149)
VLDL Cholesterol Cal: 14 mg/dL (ref 5–40)

## 2020-06-10 LAB — HEMOGLOBIN A1C
Est. average glucose Bld gHb Est-mCnc: 108 mg/dL
Hgb A1c MFr Bld: 5.4 % (ref 4.8–5.6)

## 2020-06-10 LAB — PSA: Prostate Specific Ag, Serum: 4.1 ng/mL — ABNORMAL HIGH (ref 0.0–4.0)

## 2020-06-10 LAB — TSH: TSH: 3.33 u[IU]/mL (ref 0.450–4.500)

## 2020-06-13 LAB — HEMOCCULT GUIAC POC 1CARD (OFFICE)
Card #1 Date: 7142021
Fecal Occult Blood, POC: NEGATIVE

## 2020-07-12 ENCOUNTER — Encounter: Payer: Self-pay | Admitting: Internal Medicine

## 2020-07-22 DIAGNOSIS — R972 Elevated prostate specific antigen [PSA]: Secondary | ICD-10-CM | POA: Diagnosis not present

## 2020-07-22 DIAGNOSIS — N5201 Erectile dysfunction due to arterial insufficiency: Secondary | ICD-10-CM | POA: Diagnosis not present

## 2020-08-01 ENCOUNTER — Other Ambulatory Visit: Payer: Self-pay | Admitting: Internal Medicine

## 2020-10-05 DIAGNOSIS — R972 Elevated prostate specific antigen [PSA]: Secondary | ICD-10-CM | POA: Diagnosis not present

## 2020-10-12 DIAGNOSIS — R972 Elevated prostate specific antigen [PSA]: Secondary | ICD-10-CM | POA: Diagnosis not present

## 2020-10-12 DIAGNOSIS — N5201 Erectile dysfunction due to arterial insufficiency: Secondary | ICD-10-CM | POA: Diagnosis not present

## 2020-10-31 ENCOUNTER — Other Ambulatory Visit: Payer: Self-pay | Admitting: Internal Medicine

## 2020-10-31 ENCOUNTER — Encounter: Payer: Self-pay | Admitting: Internal Medicine

## 2020-12-14 ENCOUNTER — Encounter: Payer: Self-pay | Admitting: Internal Medicine

## 2020-12-15 ENCOUNTER — Ambulatory Visit: Payer: PPO | Admitting: Internal Medicine

## 2020-12-30 ENCOUNTER — Ambulatory Visit: Payer: PPO | Admitting: Internal Medicine

## 2021-01-18 ENCOUNTER — Encounter: Payer: Self-pay | Admitting: Internal Medicine

## 2021-01-18 ENCOUNTER — Ambulatory Visit (INDEPENDENT_AMBULATORY_CARE_PROVIDER_SITE_OTHER): Payer: PPO | Admitting: Internal Medicine

## 2021-01-18 ENCOUNTER — Other Ambulatory Visit: Payer: Self-pay

## 2021-01-18 VITALS — BP 126/80 | HR 74 | Temp 97.6°F | Ht 74.0 in | Wt 238.2 lb

## 2021-01-18 DIAGNOSIS — Z6830 Body mass index (BMI) 30.0-30.9, adult: Secondary | ICD-10-CM

## 2021-01-18 DIAGNOSIS — M5412 Radiculopathy, cervical region: Secondary | ICD-10-CM | POA: Diagnosis not present

## 2021-01-18 DIAGNOSIS — E6609 Other obesity due to excess calories: Secondary | ICD-10-CM | POA: Diagnosis not present

## 2021-01-18 DIAGNOSIS — I10 Essential (primary) hypertension: Secondary | ICD-10-CM | POA: Diagnosis not present

## 2021-01-18 LAB — CMP14+EGFR
ALT: 23 IU/L (ref 0–44)
AST: 31 IU/L (ref 0–40)
Albumin/Globulin Ratio: 1.3 (ref 1.2–2.2)
Albumin: 4 g/dL (ref 3.8–4.8)
Alkaline Phosphatase: 70 IU/L (ref 44–121)
BUN/Creatinine Ratio: 19 (ref 10–24)
BUN: 18 mg/dL (ref 8–27)
Bilirubin Total: 0.4 mg/dL (ref 0.0–1.2)
CO2: 22 mmol/L (ref 20–29)
Calcium: 9.4 mg/dL (ref 8.6–10.2)
Chloride: 100 mmol/L (ref 96–106)
Creatinine, Ser: 0.97 mg/dL (ref 0.76–1.27)
GFR calc Af Amer: 92 mL/min/{1.73_m2} (ref >59)
GFR calc non Af Amer: 79 mL/min/{1.73_m2} (ref >59)
Globulin, Total: 3.1 g/dL (ref 1.5–4.5)
Glucose: 73 mg/dL (ref 65–99)
Potassium: 4.6 mmol/L (ref 3.5–5.2)
Sodium: 135 mmol/L (ref 134–144)
Total Protein: 7.1 g/dL (ref 6.0–8.5)

## 2021-01-18 MED ORDER — GABAPENTIN 300 MG PO CAPS
300.0000 mg | ORAL_CAPSULE | Freq: Every day | ORAL | 1 refills | Status: DC
Start: 2021-01-18 — End: 2021-07-16

## 2021-01-18 MED ORDER — TELMISARTAN 20 MG PO TABS
20.0000 mg | ORAL_TABLET | Freq: Every day | ORAL | 1 refills | Status: DC
Start: 2021-01-18 — End: 2021-06-22

## 2021-01-18 NOTE — Patient Instructions (Signed)

## 2021-01-18 NOTE — Progress Notes (Signed)
I,Katawbba Wiggins,acting as a Neurosurgeon for Gwynneth Aliment, MD.,have documented all relevant documentation on the behalf of Gwynneth Aliment, MD,as directed by  Gwynneth Aliment, MD while in the presence of Gwynneth Aliment, MD.  This visit occurred during the SARS-CoV-2 public health emergency.  Safety protocols were in place, including screening questions prior to the visit, additional usage of staff PPE, and extensive cleaning of exam room while observing appropriate contact time as indicated for disinfecting solutions.  Subjective:     Patient ID: Marc [name] , male    DOB: 1951-10-28 , 70 y.o.   MRN: [id number]   Chief Complaint  Patient presents with  . Hypertension    HPI  The patient is here today for blood pressure f/u.  He reports compliance with meds. He denies headaches, chest pain and shortness of breath. He is still going to the gym regularly. He is planning to start a [place] stepping class in the near future.   Hypertension This is a chronic problem. The current episode started more than 1 year ago. The problem has been gradually improving since onset. The problem is controlled. Pertinent negatives include no blurred vision, chest pain, headaches or shortness of breath. Neck pain: he has been eval by Dr. Shon Baton. He has had some relief of his sx. Now on higher dose of gabapentin. He has gone to PT. He has had Nerve conduction testing by Dr. Ethelene Hal. He does feel he is starting to improve.  Past treatments include angiotensin blockers. The current treatment provides moderate improvement. There are no compliance problems.      Past Medical History:  Diagnosis Date  . History of cocaine abuse ([facility])   . Hypertension   . Osteoarthritis      Family History  Problem Relation Age of Onset  . Stroke Mother 35  . Hypertension Mother   . Healthy Father      Current Outpatient Medications:  .  gabapentin (NEURONTIN) 300 MG capsule, TAKE 1 CAPSULE BY MOUTH AT BEDTIME, Disp: 90  capsule, Rfl: 1 .  magnesium gluconate (MAGONATE) 500 MG tablet, Take 500 mg by mouth daily., Disp: , Rfl:  .  telmisartan (MICARDIS) 20 MG tablet, TAKE 1 TABLET BY MOUTH EVERY DAY, Disp: 90 tablet, Rfl: 1 .  VITAMIN D, CHOLECALCIFEROL, PO, Take by mouth. 2 times per day, Disp: , Rfl:    No Known Allergies   Review of Systems  Constitutional: Negative.   Eyes: Negative for blurred vision.  Respiratory: Negative.  Negative for shortness of breath.   Cardiovascular: Negative.  Negative for chest pain.  Gastrointestinal: Negative.   Musculoskeletal: Neck pain: he has been eval by Dr. Shon Baton. He has had some relief of his sx. Now on higher dose of gabapentin. He has gone to PT. He has had Nerve conduction testing by Dr. Ethelene Hal. He does feel he is starting to improve.   Neurological: Negative for headaches.  Psychiatric/Behavioral: Negative.   All other systems reviewed and are negative.    Today's Vitals   01/18/21 1550  BP: 126/80  Pulse: 74  Temp: 97.6 F (36.4 C)  TempSrc: Oral  Weight: 238 lb 3.2 oz (108 kg)  Height: 6\' 2"  (1.88 m)   Body mass index is 30.58 kg/m.  Wt Readings from Last 3 Encounters:  01/18/21 238 lb 3.2 oz (108 kg)  06/09/20 233 lb 12.8 oz (106.1 kg)  06/09/20 233 lb 12.8 oz (106.1 kg)   Objective:  Physical Exam Vitals and nursing  note reviewed.  Constitutional:      Appearance: Normal appearance.  HENT:     Head: Normocephalic and atraumatic.     Nose:     Comments: Masked     Mouth/Throat:     Comments: Masked  Cardiovascular:     Rate and Rhythm: Normal rate and regular rhythm.     Heart sounds: Normal heart sounds.  Pulmonary:     Breath sounds: Normal breath sounds.  Musculoskeletal:     Cervical back: Normal range of motion.  Skin:    General: Skin is warm.  Neurological:     General: No focal deficit present.     Mental Status: He is alert and oriented to person, place, and time.         Assessment And Plan:     1. Essential  hypertension, benign  2. Class 1 obesity due to excess calories with serious comorbidity and body mass index (BMI) of 30.0 to 30.9 in adult  He is encouraged to initially strive for BMI less than 30 to decrease cardiac risk. He is advised to exercise no less than 150 minutes per week.    Patient was given opportunity to ask questions. Patient verbalized understanding of the plan and was able to repeat key elements of the plan. All questions were answered to their satisfaction.   I, Gwynneth Aliment, MD, have reviewed all documentation for this visit. The documentation on 01/18/21 for the exam, diagnosis, procedures, and orders are all accurate and complete.  THE PATIENT IS ENCOURAGED TO PRACTICE SOCIAL DISTANCING DUE TO THE COVID-19 PANDEMIC.

## 2021-02-03 ENCOUNTER — Telehealth: Payer: Self-pay | Admitting: *Deleted

## 2021-02-03 NOTE — Chronic Care Management (AMB) (Signed)
  Chronic Care Management   Outreach Note  02/03/2021 Name: Delray Reza MRN: [id number] DOB: [birth year]  Marc [name] is a 70 y.o. year old male who is a primary care patient of Dorothyann Peng, MD. I reached out to William Dalton by phone today in response to a referral sent by Mr. Seith Friscia's PCP, Dr. Allyne Gee.     An unsuccessful telephone outreach was attempted today. The patient was referred to the case management team for assistance with care management and care coordination.   Follow Up Plan: A HIPAA compliant phone message was left for the patient providing contact information and requesting a return call. The care management team will reach out to the patient again over the next 7 days. If patient returns call to provider office, please advise to call Embedded Care Management Care Guide Gwenevere Ghazi at [phone].  Gwenevere Ghazi  Care Guide, Embedded Care Coordination [facility] Management

## 2021-02-07 NOTE — Chronic Care Management (AMB) (Signed)
  Chronic Care Management   Outreach Note  02/07/2021 Name: Marc [name] MRN: [id number] DOB: January 03, 1951  Marc [name] is a 70 y.o. year old male who is a primary care patient of Marc Peng, MD. I reached out to Marc [name] by phone today in response to a referral sent by [name]'s PCP, Dr. Allyne [name].     A second unsuccessful telephone outreach was attempted today. The patient was referred to the case management team for assistance with care management and care coordination.   Follow Up Plan: A HIPAA compliant phone message was left for the patient providing contact information and requesting a return call.  The care management team will reach out to the patient again over the next 7 days.  If patient returns call to provider office, please advise to call Embedded Care Management Care Guide Marc [name]* at [phone].Marc [name] Marc [name]  Care Guide, Embedded Care Coordination [facility] Management

## 2021-02-16 NOTE — Chronic Care Management (AMB) (Signed)
  Chronic Care Management   Outreach Note  02/16/2021 Name: Andrews Tener MRN: [id number] DOB: [birth year]  Jacy Howat is a 70 y.o. year old male who is a primary care patient of Dorothyann Peng, MD. I reached out to William Dalton by phone today in response to a referral sent by Mr. Spiros Diener's PCP, Dr. Allyne Gee     Third unsuccessful telephone outreach was attempted today. The patient was referred to the case management team for assistance with care management and care coordination. The patient's primary care provider has been notified of our unsuccessful attempts to make or maintain contact with the patient. The care management team is pleased to engage with this patient at any time in the future should he/she be interested in assistance from the care management team.   Follow Up Plan: We have been unable to make contact with the patient. The care management team is available to follow up with the patient after provider conversation with the patient regarding recommendation for care management engagement and subsequent re-referral to the care management team. A HIPAA compliant phone message was left for the patient providing contact information and requesting a return call. If patient returns call to provider office, please advise to call Embedded Care Management Care Guide Gwenevere Ghazi at [phone].   Gwenevere Ghazi  Care Guide, Embedded Care Coordination [facility]  [place], [place] [address]

## 2021-02-22 ENCOUNTER — Encounter: Payer: Self-pay | Admitting: Internal Medicine

## 2021-02-28 ENCOUNTER — Telehealth: Payer: Self-pay

## 2021-02-28 ENCOUNTER — Encounter: Payer: Self-pay | Admitting: Internal Medicine

## 2021-02-28 ENCOUNTER — Ambulatory Visit: Payer: Self-pay | Admitting: Nurse Practitioner

## 2021-02-28 NOTE — Telephone Encounter (Signed)
Pt sent message regarding sinus issues responded to pt also scheduled appt ask pt to call if the time did not work for him. Pt did not show for appt, called pt to offer a different time LVM for him to call the office back

## 2021-02-28 NOTE — Telephone Encounter (Signed)
Pt stated that he was unhappy about not getting antibiotic when asked. Told the pt that we could reschedule while reminding him of appointment that was missed on 02/28/2021. Appointment was made the same day of appointment requested. Reached out to pt twice, he did not answer the second time seeing if pt could come in later this afternoon, being the 1130 appt passed and he did not show. He refused, wanting to wait until his physical. He stated if he was to reschedule it would be with another doctor. Pt was rude, and did hang up the phone call. He also stated that he could not just leave work to make this appointment having other things to do.

## 2021-06-12 ENCOUNTER — Emergency Department (HOSPITAL_COMMUNITY)
Admission: EM | Admit: 2021-06-12 | Discharge: 2021-06-12 | Disposition: A | Discharge status: Home / Self Care | Payer: PPO | Attending: Emergency Medicine | Admitting: Emergency Medicine

## 2021-06-12 ENCOUNTER — Other Ambulatory Visit: Payer: Self-pay

## 2021-06-12 ENCOUNTER — Encounter (HOSPITAL_COMMUNITY): Payer: Self-pay | Admitting: Emergency Medicine

## 2021-06-12 ENCOUNTER — Emergency Department (HOSPITAL_COMMUNITY): Payer: PPO

## 2021-06-12 DIAGNOSIS — Z79899 Other long term (current) drug therapy: Secondary | ICD-10-CM | POA: Diagnosis not present

## 2021-06-12 DIAGNOSIS — Z96652 Presence of left artificial knee joint: Secondary | ICD-10-CM | POA: Insufficient documentation

## 2021-06-12 DIAGNOSIS — I10 Essential (primary) hypertension: Secondary | ICD-10-CM | POA: Insufficient documentation

## 2021-06-12 DIAGNOSIS — R0789 Other chest pain: Secondary | ICD-10-CM | POA: Diagnosis not present

## 2021-06-12 DIAGNOSIS — R079 Chest pain, unspecified: Secondary | ICD-10-CM | POA: Diagnosis not present

## 2021-06-12 LAB — CBC
HCT: 50 % (ref 39.0–52.0)
Hemoglobin: 16.4 g/dL (ref 13.0–17.0)
MCH: 28.2 pg (ref 26.0–34.0)
MCHC: 32.8 g/dL (ref 30.0–36.0)
MCV: 86.1 fL (ref 80.0–100.0)
Platelets: 169 10*3/uL (ref 150–400)
RBC: 5.81 MIL/uL (ref 4.22–5.81)
RDW: 12.7 % (ref 11.5–15.5)
WBC: 5.2 10*3/uL (ref 4.0–10.5)
nRBC: 0 % (ref 0.0–0.2)

## 2021-06-12 LAB — BASIC METABOLIC PANEL
Anion gap: 7 (ref 5–15)
BUN: 12 mg/dL (ref 8–23)
CO2: 25 mmol/L (ref 22–32)
Calcium: 9.5 mg/dL (ref 8.9–10.3)
Chloride: 105 mmol/L (ref 98–111)
Creatinine, Ser: 1.1 mg/dL (ref 0.61–1.24)
GFR, Estimated: >60 mL/min (ref >60)
Glucose, Bld: 91 mg/dL (ref 70–99)
Potassium: 4.1 mmol/L (ref 3.5–5.1)
Sodium: 137 mmol/L (ref 135–145)

## 2021-06-12 LAB — TROPONIN I (HIGH SENSITIVITY)
Troponin I (High Sensitivity): 5 ng/L (ref <18)
Troponin I (High Sensitivity): 5 ng/L (ref <18)

## 2021-06-12 NOTE — Discharge Instructions (Addendum)
As discussed, your evaluation today has been largely reassuring.  But, it is important that you monitor your condition carefully, and do not hesitate to return to the ED if you develop new, or concerning changes in your condition.  Our cardiology colleagues will call you tomorrow for follow-up in the next few days.

## 2021-06-12 NOTE — ED Triage Notes (Signed)
Pt coming from home complaint of chest pain for last few days that has gotten progressively worse.

## 2021-06-12 NOTE — ED Provider Notes (Addendum)
[facility] EMERGENCY DEPARTMENT Provider Note   CSN: [id number] Arrival date & time: 06/12/21  1975     History Chief Complaint  Patient presents with   Chest Pain    Maxamus Colao is a 70 y.o. male.  HPI Patient presents concern of episodic chest pain.  He is generally well, denies history of cardiac disease, has not had a recent heart evaluation.  He exercises frequently, rides his bike 3-4 times a week, 20, 30 miles. He notes that he has not had chest pain during exertion including yesterday.  Pain is left upper chest, pressure-like, sensation of relief with pressure application.  Pain is inconsistent, not necessarily exertional, positional. No medication taken for relief as the pain is so random in its occurrences. No associated dyspnea, and no current complaints.     Past Medical History:  Diagnosis Date   History of cocaine abuse (HCC)    Hypertension    Osteoarthritis     Patient Active Problem List   Diagnosis Date Noted   Class 1 obesity due to excess calories with serious comorbidity and body mass index (BMI) of 30.0 to 30.9 in adult 01/18/2021   Cervical radiculopathy 11/07/2018   Essential hypertension, benign 09/18/2018   History of cocaine abuse (HCC) 05/29/2013   Near syncope 05/29/2013    Past Surgical History:  Procedure Laterality Date   KNEE ARTHROPLASTY Left 06/21/11   Ollen Gross, MD       Family History  Problem Relation Age of Onset   Stroke Mother 67   Hypertension Mother    Healthy Father     Social History   Tobacco Use   Smoking status: Never   Smokeless tobacco: Never  Vaping Use   Vaping Use: Never used  Substance Use Topics   Alcohol use: Not Currently    Comment: beer a week maybe   Drug use: Not Currently    Comment: Last in Nov 2013    Home Medications Prior to Admission medications   Medication Sig Start Date End Date Taking? Authorizing Provider  gabapentin (NEURONTIN) 300 MG capsule Take 1  capsule (300 mg total) by mouth at bedtime. 01/18/21   Dorothyann Peng, MD  magnesium gluconate (MAGONATE) 500 MG tablet Take 500 mg by mouth daily.    [provider]  telmisartan (MICARDIS) 20 MG tablet Take 1 tablet (20 mg total) by mouth daily. 01/18/21   Dorothyann Peng, MD  VITAMIN D, CHOLECALCIFEROL, PO Take by mouth. 2 times per day    [provider]    Allergies    Patient has no known allergies.  Review of Systems   Review of Systems  Constitutional:        Per HPI, otherwise negative  HENT:         Per HPI, otherwise negative  Respiratory:         Per HPI, otherwise negative  Cardiovascular:        Per HPI, otherwise negative  Gastrointestinal:  Negative for vomiting.  Endocrine:       Negative aside from HPI  Genitourinary:        Neg aside from HPI   Musculoskeletal:        Per HPI, otherwise negative  Skin: Negative.   Neurological:  Negative for syncope.   Physical Exam Updated Vital Signs BP (!) 149/125   Pulse 66   Temp 98 F (36.7 C)   Resp 20   Ht 6\' 3"  (1.905 m)  Wt 103.4 kg   SpO2 100%   BMI 28.50 kg/m   Physical Exam Vitals and nursing note reviewed.  Constitutional:      General: He is not in acute distress.    Appearance: He is well-developed. He is not ill-appearing, toxic-appearing or diaphoretic.  HENT:     Head: Normocephalic and atraumatic.  Eyes:     Conjunctiva/sclera: Conjunctivae normal.  Cardiovascular:     Rate and Rhythm: Normal rate and regular rhythm.  Pulmonary:     Effort: Pulmonary effort is normal. No respiratory distress.     Breath sounds: No stridor.  Abdominal:     General: There is no distension.  Skin:    General: Skin is warm and dry.  Neurological:     Mental Status: He is alert and oriented to person, place, and time.    ED Results / Procedures / Treatments   Labs (all labs ordered are listed, but only abnormal results are displayed) Labs Reviewed  BASIC METABOLIC PANEL  CBC   TROPONIN I (HIGH SENSITIVITY)  TROPONIN I (HIGH SENSITIVITY)    EKG EKG Interpretation  Date/Time:  Sunday June 12 2021 09:30:07 EDT Ventricular Rate:  78 PR Interval:  224 QRS Duration: 80 QT Interval:  388 QTC Calculation: 442 R Axis:   49 Text Interpretation: Sinus rhythm with 1st degree A-V block Artifact Abnormal ECG Confirmed by Gerhard Munch [phone]) on 06/12/2021 9:37:32 AM  Radiology DG Chest 2 View  Result Date: 06/12/2021 CLINICAL DATA:  Chest pain EXAM: CHEST - 2 VIEW COMPARISON:  None. FINDINGS: Normal mediastinum and cardiac silhouette. Normal pulmonary vasculature. No evidence of effusion, infiltrate, or pneumothorax. No acute bony abnormality. IMPRESSION: No acute cardiopulmonary process. Electronically Signed   By: Genevive Bi M.D.   On: 06/12/2021 10:17    Procedures Procedures   Medications Ordered in ED Medications - No data to display  ED Course  I have reviewed the triage vital signs and the nursing notes.  Pertinent labs & imaging results that were available during my care of the patient were reviewed by me and considered in my medical decision making (see chart for details).  Update:, Patient in no distress  2:13 PM Patient awake, alert, sitting upright, watching television. We again discussed his presentation, his pain, I discussed his reassuring findings today, BMP unremarkable, troponin x2 unchanged, less than 10, nonischemic EKG, x-ray reassuring, no evidence for pneumonia, pneumothorax, or other acute findings. His description of pain that is somewhat relieved with pressure to the left upper chest suggests noncardiac etiology, but the patient has not had a recent heart evaluation.  Given his age, we discussed admission versus close outpatient follow-up for cardiac evaluation, the patient has a preference for the latter, which is reasonable given today's after mentioned reassuring findings. This was facilitated, patient discharged in stable  condition.  I spoke with Dr. Anne Fu, who will facilitate outpatient cardiology follow-up, in the office will contact the patient tomorrow for this.  MDM Rules/Calculators/A&P MDM Number of Diagnoses or Management Options Atypical chest pain: new, needed workup   Amount and/or Complexity of Data Reviewed Clinical lab tests: ordered and reviewed Tests in the radiology section of CPT: ordered and reviewed Tests in the medicine section of CPT: reviewed and ordered Decide to obtain previous medical records or to obtain history from someone other than the patient: yes Review and summarize past medical records: yes Independent visualization of images, tracings, or specimens: yes  Risk of Complications, Morbidity, and/or Mortality Presenting problems:  high Diagnostic procedures: high Management options: high  Critical Care Total time providing critical care: < 30 minutes  Patient Progress Patient progress: stable   Final Clinical Impression(s) / ED Diagnoses Final diagnoses:  Atypical chest pain     Gerhard Munch, MD 06/12/21 1416    Gerhard Munch, MD 06/12/21 1421

## 2021-06-13 ENCOUNTER — Telehealth: Payer: Self-pay

## 2021-06-13 NOTE — Telephone Encounter (Signed)
Transition Care Management Unsuccessful Follow-up Telephone Call  Date of discharge and from where:  [facility]   Attempts:  1st Attempt  Reason for unsuccessful TCM follow-up call:  Left voice message    

## 2021-06-14 ENCOUNTER — Ambulatory Visit: Payer: PPO | Admitting: Cardiovascular Disease

## 2021-06-14 ENCOUNTER — Encounter: Payer: Self-pay | Admitting: Cardiovascular Disease

## 2021-06-14 ENCOUNTER — Other Ambulatory Visit: Payer: Self-pay

## 2021-06-14 VITALS — BP 126/84 | HR 70 | Ht 75.0 in | Wt 235.0 lb

## 2021-06-14 DIAGNOSIS — I10 Essential (primary) hypertension: Secondary | ICD-10-CM | POA: Diagnosis not present

## 2021-06-14 DIAGNOSIS — R072 Precordial pain: Secondary | ICD-10-CM | POA: Diagnosis not present

## 2021-06-14 MED ORDER — METOPROLOL TARTRATE 100 MG PO TABS: ORAL_TABLET | ORAL | 0 refills | Status: DC

## 2021-06-14 NOTE — Patient Instructions (Addendum)
Medication Instructions:  *If you need a refill on your cardiac medications before your next appointment, please call your pharmacy*  Lab Work: If you have labs (blood work) drawn today and your tests are completely normal, you will receive your results only by: MyChart Message (if you have MyChart) OR A paper copy in the mail If you have any lab test that is abnormal or we need to change your treatment, we will call you to review the results.  Testing/Procedures: Your physician has requested that you have cardiac CT. Cardiac computed tomography (CT) is a painless test that uses an x-ray machine to take clear, detailed pictures of your heart. For further information please visit [email or web address] Please follow instruction sheet as given.  Follow-Up: At [facility], you and your health needs are our priority.  As part of our continuing mission to provide you with exceptional heart care, we have created designated Provider Care Teams.  These Care Teams include your primary Cardiologist (physician) and Advanced Practice Providers (APPs -  Physician Assistants and Nurse Practitioners) who all work together to provide you with the care you need, when you need it.  We recommend signing up for the patient portal called "MyChart".  Sign up information is provided on this After Visit Summary.  MyChart is used to connect with patients for Virtual Visits (Telemedicine).  Patients are able to view lab/test results, encounter notes, upcoming appointments, etc.  Non-urgent messages can be sent to your provider as well.   To learn more about what you can do with MyChart, go to [email or web address].    Your next appointment:   As needed  The format for your next appointment:   In Person  Provider:   You may see Dr. Eden Emms or one of the following Advanced Practice Providers on your designated Care Team:   Tereso Newcomer, PA-C [place], [place]     Your cardiac CT will be scheduled at one of the  below locations:   [facility] [address] [place], [place] [address] [phone]  If scheduled at [facility] [place], please arrive at the [facility] main entrance (entrance A) of [facility] 30 minutes prior to test start time. Proceed to the [facility] Radiology Department (first floor) to check-in and test prep.  Please follow these instructions carefully (unless otherwise directed):  Hold all erectile dysfunction medications at least 3 days (72 hrs) prior to test.  On the Night Before the Test: Be sure to Drink plenty of water. Do not consume any caffeinated/decaffeinated beverages or chocolate 12 hours prior to your test. Do not take any antihistamines 12 hours prior to your test.  On the Day of the Test: Drink plenty of water until 1 hour prior to the test. Do not eat any food 4 hours prior to the test. You may take your regular medications prior to the test.  Take metoprolol (Lopressor) 100 mg two hours prior to test.  After the Test: Drink plenty of water. After receiving IV contrast, you may experience a mild flushed feeling. This is normal. On occasion, you may experience a mild rash up to 24 hours after the test. This is not dangerous. If this occurs, you can take Benadryl 25 mg and increase your fluid intake. If you experience trouble breathing, this can be serious. If it is severe call 911 IMMEDIATELY. If it is mild, please call our office.  Please allow 2-4 weeks for scheduling of routine cardiac CTs. Some insurance companies require  a pre-authorization which may delay scheduling of this test.   For non-scheduling related questions, please contact the cardiac imaging nurse navigator should you have any questions/concerns: Rockwell Alexandria, Cardiac Imaging Nurse Navigator Larey Brick, Cardiac Imaging Nurse Navigator [facility] Direct Office Dial: [phone]   For scheduling needs, including cancellations and  rescheduling, please call [place], [phone].

## 2021-06-14 NOTE — Progress Notes (Signed)
CARDIOLOGY CONSULT NOTE       Patient ID: Marc [name] MRN: [id number] DOB/AGE: 12/19/1950 70 y.o.  Admit date: (Not on file) Referring Physician: Jeraldine Loots Primary Physician: Dorothyann Peng, MD Primary Cardiologist: New Reason for Consultation: Chest Pain  Active Problems:   * No active hospital problems. *   HPI:  70 y.o. referred by Dr Jeraldine Loots for chest pain. History of cocaine use and HTN. Seen in ED 7/17  He is generally well, denies history of cardiac disease, has not had a recent heart evaluation.  He exercises frequently, rides his bike 3-4 times a week, 20, 30 miles. He notes that he has not had chest pain during exertion including yesterday.  Pain is left upper chest, pressure-like, sensation of relief with pressure application.  Pain is inconsistent, not necessarily exertional, positional.  No medication taken for relief as the pain is so random in its occurrences. No associated dyspnea, and no current complaints.  W/U included CXR NAD negative troponin x 2 normal CBC/BMET reviewed  ECG SR PR 224 msec otherwise normal   Pain is atypical and associated with fatigue / malaise. Lifts weights and did not change regimen or due Any new exercises Initial event happened after mowing lawn but he denies being dehydrated  Went to [employer] football Had a brother who played for [employer] Retired from Patent examiner trading Denies drug, stimulant, testosterone use   ROS All other systems reviewed and negative except as noted above  Past Medical History:  Diagnosis Date   History of cocaine abuse (HCC)    Hypertension    Osteoarthritis     Family History  Problem Relation Age of Onset   Stroke Mother 31   Hypertension Mother    Healthy Father     Social History   Socioeconomic History   Marital status: Single    Spouse name: Not on file   Number of children: 0   Years of education: [employer]   Highest education level: Not on file  Occupational History    Occupation: Scientist, forensic: UNEMPLOYED    Comment: since 2009   Occupation: retired  Tobacco Use   Smoking status: Never   Smokeless tobacco: Never  Vaping Use   Vaping Use: Never used  Substance and Sexual Activity   Alcohol use: Not Currently    Comment: beer a week maybe   Drug use: Not Currently    Comment: Last in Nov 2013   Sexual activity: Not Currently  Other Topics Concern   Not on file  Social History Narrative   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 70 yo)   Social Determinants of Corporate investment banker Strain: Not on file  Food Insecurity: Not on file  Transportation Needs: Not on file  Physical Activity: Not on file  Stress: Not on file  Social Connections: Not on file  Intimate Partner Violence: Not on file    Past Surgical History:  Procedure Laterality Date   KNEE ARTHROPLASTY Left 06/21/11   Ollen Gross, MD      Current Outpatient Medications:    gabapentin (NEURONTIN) 300 MG capsule, Take 1 capsule (300 mg total) by mouth at bedtime., Disp: 90 capsule, Rfl: 1   magnesium gluconate (MAGONATE) 500 MG tablet, Take 500 mg by mouth daily., Disp: , Rfl:    telmisartan (MICARDIS) 20 MG tablet, Take 1 tablet (20 mg total) by mouth daily., Disp: 90 tablet, Rfl: 1   VITAMIN D, CHOLECALCIFEROL, PO, Take by mouth. 2  times per day, Disp: , Rfl:     Physical Exam: Blood pressure 126/84, pulse 70, height 6\' 3"  (1.905 m), weight 106.6 kg.   Affect appropriate Healthy:  appears stated age HEENT: normal Neck supple with no adenopathy JVP normal no bruits no thyromegaly Lungs clear with no wheezing and good diaphragmatic motion Heart:  S1/S2 no murmur, no rub, gallop or click PMI normal Abdomen: benighn, BS positve, no tenderness, no AAA no bruit.  No HSM or HJR Distal pulses intact with no bruits No edema Neuro non-focal Skin warm and dry Post right knee surgery    Labs:   Lab Results  Component Value Date   WBC 5.2 06/12/2021    HGB 16.4 06/12/2021   HCT 50.0 06/12/2021   MCV 86.1 06/12/2021   PLT 169 06/12/2021    Recent Labs  Lab 06/12/21 0927  NA 137  K 4.1  CL 105  CO2 25  BUN 12  CREATININE 1.10  CALCIUM 9.5  GLUCOSE 91   Lab Results  Component Value Date   TROPONINI <0.30 05/30/2013    Lab Results  Component Value Date   CHOL 206 (H) 06/09/2020   CHOL 236 (H) 06/12/2019   CHOL 217 (H) 05/29/2013   Lab Results  Component Value Date   HDL 62 06/09/2020   HDL 66 06/12/2019   HDL 70 05/29/2013   Lab Results  Component Value Date   LDLCALC 130 (H) 06/09/2020   LDLCALC 153 (H) 06/12/2019   LDLCALC 131 (H) 05/29/2013   Lab Results  Component Value Date   TRIG 77 06/09/2020   TRIG 87 06/12/2019   TRIG 79 05/29/2013   Lab Results  Component Value Date   CHOLHDL 3.3 06/09/2020   CHOLHDL 3.6 06/12/2019   CHOLHDL 3.1 05/29/2013   No results found for: LDLDIRECT    Radiology: DG Chest 2 View  Result Date: 06/12/2021 CLINICAL DATA:  Chest pain EXAM: CHEST - 2 VIEW COMPARISON:  None. FINDINGS: Normal mediastinum and cardiac silhouette. Normal pulmonary vasculature. No evidence of effusion, infiltrate, or pneumothorax. No acute bony abnormality. IMPRESSION: No acute cardiopulmonary process. Electronically Signed   By: 06/14/2021 M.D.   On: 06/12/2021 10:17    EKG: NSR normal PR 224 msec   ASSESSMENT AND PLAN:   Chest Pain: atypical r/o in ER Shared decision making felt cardiac CTA best to risk stratify. Lopressor 100 mg 2 hours before scan No contrast allergy  HTN:  continue ARB well controlled   F/U PRN if cardiac CT normal   Signed: 06/14/2021 06/14/2021, 4:04 PM

## 2021-06-15 ENCOUNTER — Telehealth: Payer: Self-pay

## 2021-06-15 ENCOUNTER — Encounter: Payer: PPO | Admitting: Internal Medicine

## 2021-06-15 NOTE — Telephone Encounter (Signed)
Transition Care Management Unsuccessful Follow-up Telephone Call  Date of discharge and from where:  06/12/2021. ED [facility]  Attempts:  2nd Attempt  Reason for unsuccessful TCM follow-up call:  Left voice message

## 2021-06-22 ENCOUNTER — Other Ambulatory Visit: Payer: Self-pay

## 2021-06-22 ENCOUNTER — Other Ambulatory Visit: Payer: Self-pay | Admitting: Internal Medicine

## 2021-06-22 ENCOUNTER — Telehealth (HOSPITAL_COMMUNITY): Payer: Self-pay | Admitting: Emergency Medicine

## 2021-06-22 ENCOUNTER — Ambulatory Visit (INDEPENDENT_AMBULATORY_CARE_PROVIDER_SITE_OTHER): Payer: PPO | Admitting: Internal Medicine

## 2021-06-22 ENCOUNTER — Encounter: Payer: Self-pay | Admitting: Internal Medicine

## 2021-06-22 VITALS — BP 126/88 | HR 76 | Temp 98.1°F | Ht 75.0 in | Wt 235.0 lb

## 2021-06-22 DIAGNOSIS — I10 Essential (primary) hypertension: Secondary | ICD-10-CM

## 2021-06-22 DIAGNOSIS — Z0000 Encounter for general adult medical examination without abnormal findings: Secondary | ICD-10-CM | POA: Diagnosis not present

## 2021-06-22 DIAGNOSIS — R0789 Other chest pain: Secondary | ICD-10-CM | POA: Diagnosis not present

## 2021-06-22 LAB — POCT URINALYSIS DIPSTICK
Bilirubin, UA: NEGATIVE
Glucose, UA: NEGATIVE
Ketones, UA: NEGATIVE
Leukocytes, UA: NEGATIVE
Nitrite, UA: NEGATIVE
Protein, UA: NEGATIVE
Spec Grav, UA: 1.015 (ref 1.010–1.025)
Urobilinogen, UA: 0.2 E.U./dL
pH, UA: 7 (ref 5.0–8.0)

## 2021-06-22 LAB — POCT UA - MICROALBUMIN
Albumin/Creatinine Ratio, Urine, POC: <30
Creatinine, POC: 200 mg/dL
Microalbumin Ur, POC: 10 mg/L

## 2021-06-22 MED ORDER — TELMISARTAN 20 MG PO TABS: 20.0000 mg | ORAL_TABLET | Freq: Every day | ORAL | 1 refills | Status: DC

## 2021-06-22 NOTE — Progress Notes (Signed)
I,Katawbba Wiggins,acting as a Education administrator for [name], MD.,have documented all relevant documentation on the behalf of [name], MD,as directed by  [name], MD while in the presence of [name], MD.  This visit occurred during the SARS-CoV-2 public health emergency.  Safety protocols were in place, including screening questions prior to the visit, additional usage of staff PPE, and extensive cleaning of exam room while observing appropriate contact time as indicated for disinfecting solutions.  Subjective:     Patient ID: Marc [name] , male    DOB: 01-14-1951 , 70 y.o.   MRN: [id number]   Chief Complaint  Patient presents with   Annual Exam   Hypertension    HPI  He presents today for a full physical exam. He denies having any specific concerns or complaints at this time.   Hypertension This is a chronic problem. The current episode started more than 1 year ago. The problem has been gradually improving since onset. The problem is controlled. Associated symptoms include chest pain. Pertinent negatives include no blurred vision, headaches, palpitations or shortness of breath. Past treatments include angiotensin blockers. The current treatment provides moderate improvement. Compliance problems include diet.     Past Medical History:  Diagnosis Date   History of cocaine abuse ([place])    Hypertension    Osteoarthritis      Family History  Problem Relation Age of Onset   Stroke Mother 31   Hypertension Mother    Healthy Father      Current Outpatient Medications:    gabapentin (NEURONTIN) 300 MG capsule, Take 1 capsule (300 mg total) by mouth at bedtime., Disp: 90 capsule, Rfl: 1   magnesium gluconate (MAGONATE) 500 MG tablet, Take 500 mg by mouth daily., Disp: , Rfl:    telmisartan (MICARDIS) 20 MG tablet, Take 1 tablet (20 mg total) by mouth daily., Disp: 90 tablet, Rfl: 1   metoprolol tartrate (LOPRESSOR) 100 MG tablet, Take one tablet by mouth 2 hours  prior to CT test (Patient not taking: Reported on 06/22/2021), Disp: 1 tablet, Rfl: 0   VITAMIN D, CHOLECALCIFEROL, PO, Take by mouth. 2 times per day (Patient not taking: Reported on 06/22/2021), Disp: , Rfl:    No Known Allergies    Men's preventive visit. Patient Health Questionnaire (PHQ-2) is  [place] Office Visit from 06/22/2021 in [facility]  PHQ-2 Total Score 0     . Patient is on a healthy diet. Marital status: Single. Relevant history for alcohol use is:  Social History   Substance and Sexual Activity  Alcohol Use Not Currently   Comment: beer a week maybe  . Relevant history for tobacco use is:  Social History   Tobacco Use  Smoking Status Never  Smokeless Tobacco Never  . Review of Systems  Constitutional: Negative.   HENT: Negative.    Eyes: Negative.  Negative for blurred vision.  Respiratory: Negative.  Negative for shortness of breath.   Cardiovascular:  Positive for chest pain. Negative for palpitations.       Since he last visit, he was evaluated in ER for CP on 7/17. He reports his sx started after mowing his lawn. He exercises regularly and does not have cp/sob during activity. ER eval neg for acute event, advised to f/u w/ Cardiology. He has been seen [facility], scheduled for coronary CT - scheduled this Friday. He has not had any recurrence of his sx.   Gastrointestinal: Negative.   Endocrine: Negative.  Genitourinary: Negative.   Musculoskeletal: Negative.   Skin: Negative.   Allergic/Immunologic: Negative.   Neurological: Negative.  Negative for headaches.  Hematological: Negative.   Psychiatric/Behavioral: Negative.      Today's Vitals   06/22/21 1535  BP: 126/88  Pulse: 76  Temp: 98.1 F (36.7 C)  TempSrc: Oral  Weight: 235 lb (106.6 kg)  Height: _0  (1.905 m)   Body mass index is 29.37 kg/m.  Wt Readings from Last 3 Encounters:  06/22/21 235 lb (106.6 kg)  06/14/21 235 lb (106.6 kg)  06/12/21 228 lb (103.4  kg)    BP Readings from Last 3 Encounters:  06/22/21 126/88  06/14/21 126/84  06/12/21 (!) 161/105    Objective:  Physical Exam Vitals and nursing note reviewed.  Constitutional:      Appearance: Normal appearance.  HENT:     Head: Normocephalic and atraumatic.     Right Ear: Tympanic membrane, ear canal and external ear normal.     Left Ear: Tympanic membrane, ear canal and external ear normal.     Nose:     Comments: Masked     Mouth/Throat:     Comments: Masked  Eyes:     Extraocular Movements: Extraocular movements intact.     Conjunctiva/sclera: Conjunctivae normal.     Pupils: Pupils are equal, round, and reactive to light.  Cardiovascular:     Rate and Rhythm: Normal rate and regular rhythm.     Pulses: Normal pulses.     Heart sounds: Normal heart sounds.  Pulmonary:     Effort: Pulmonary effort is normal.     Breath sounds: Normal breath sounds.  Chest:  Breasts:    Right: Normal. No swelling, bleeding, inverted nipple, mass or nipple discharge.     Left: Normal. No swelling, bleeding, inverted nipple, mass or nipple discharge.  Abdominal:     General: Abdomen is flat. Bowel sounds are normal.     Palpations: Abdomen is soft.  Genitourinary:    Comments: Deferred  Musculoskeletal:        General: Normal range of motion.     Cervical back: Normal range of motion and neck supple.  Skin:    General: Skin is warm.  Neurological:     General: No focal deficit present.     Mental Status: He is alert.  Psychiatric:        Mood and Affect: Mood normal.        Behavior: Behavior normal.        Assessment And Plan:     1. Routine general medical examination at health care facility Comments: A full exam was performed. DRE deferred, followed by Urology. PATIENT IS ADVISED TO GET 30-45 MINUTES REGULAR EXERCISE NO LESS THAN FOUR TO FIVE DAYS PER WEEK - BOTH WEIGHTBEARING EXERCISES AND AEROBIC ARE RECOMMENDED.  PATIENT IS ADVISED TO FOLLOW A HEALTHY DIET WITH AT  LEAST SIX FRUITS/VEGGIES PER DAY, DECREASE INTAKE OF RED MEAT, AND TO INCREASE FISH INTAKE TO TWO DAYS PER WEEK.  MEATS/FISH SHOULD NOT BE FRIED, BAKED OR BROILED IS PREFERABLE.  IT IS ALSO IMPORTANT TO CUT BACK ON YOUR SUGAR INTAKE. PLEASE AVOID ANYTHING WITH ADDED SUGAR, CORN SYRUP OR OTHER SWEETENERS. IF YOU MUST USE A SWEETENER, YOU CAN TRY STEVIA. IT IS ALSO IMPORTANT TO AVOID ARTIFICIALLY SWEETENERS AND DIET BEVERAGES. LASTLY, I SUGGEST WEARING SPF 50 SUNSCREEN ON EXPOSED PARTS AND ESPECIALLY WHEN IN THE DIRECT SUNLIGHT FOR AN EXTENDED PERIOD OF TIME.  PLEASE AVOID FAST FOOD  RESTAURANTS AND INCREASE YOUR WATER INTAKE.   2. Essential hypertension, benign Comments: Chronic, fair control. EKG not performed. He is encouraged to follow low sodium diet. I will refer him to Ophthalmology for glaucoma evaluation for glaucoma screening.  - POCT Urinalysis Dipstick (81002) - POCT UA - Microalbumin - CMP14+EGFR - Lipid panel  3. Atypical chest pain Comments: ER records reviewed, Cardiology input appreciated. We discussed ASCVD risk. Will f/u w/ Cardiology recommendations once his test results are available.   Patient was given opportunity to ask questions. Patient verbalized understanding of the plan and was able to repeat key elements of the plan. All questions were answered to their satisfaction.   I, [name], MD, have reviewed all documentation for this visit. The documentation on 06/22/21 for the exam, diagnosis, procedures, and orders are all accurate and complete.  THE PATIENT IS ENCOURAGED TO PRACTICE SOCIAL DISTANCING DUE TO THE COVID-19 PANDEMIC.

## 2021-06-22 NOTE — Telephone Encounter (Signed)
Attempted to call patient regarding upcoming cardiac CT appointment. °Left message on voicemail with name and callback number °Marilin Kofman RN Navigator Cardiac Imaging °[facility] °[phone] Office °[phone] Cell ° °

## 2021-06-22 NOTE — Patient Instructions (Addendum)
The 10-year ASCVD risk score Denman George DC Montez Hageman., et al., 2013) is: 17.6%   Values used to calculate the score:     Age: 70 years     Sex: Male     Is Non-Hispanic African American: Yes     Diabetic: No     Tobacco smoker: No     Systolic Blood Pressure: 126 mmHg     Is BP treated: Yes     HDL Cholesterol: 62 mg/dL     Total Cholesterol: 206 mg/dL   Health Maintenance After Age 81 After age 31, you are at a higher risk for certain long-term diseases and infections as well as injuries from falls. Falls are a major cause of broken bones and head injuries in people who are older than age 56. Getting regular preventive care can help to keep you healthy and well. Preventive care includes getting regular testing and making lifestyle changes as recommended by your health care provider. Talk with your health care provider about: Which screenings and tests you should have. A screening is a test that checks for a disease when you have no symptoms. A diet and exercise plan that is right for you. What should I know about screenings and tests to prevent falls? Screening and testing are the best ways to find a health problem early. Early diagnosis and treatment give you the best chance of managing medical conditions that are common after age 80. Certain conditions and lifestyle choices may make you more likely to have a fall. Your health care provider may recommend: Regular vision checks. Poor vision and conditions such as cataracts can make you more likely to have a fall. If you wear glasses, make sure to get your prescription updated if your vision changes. Medicine review. Work with your health care provider to regularly review all of the medicines you are taking, including over-the-counter medicines. Ask your health care provider about any side effects that may make you more likely to have a fall. Tell your health care provider if any medicines that you take make you feel dizzy or sleepy. Osteoporosis screening.  Osteoporosis is a condition that causes the bones to get weaker. This can make the bones weak and cause them to break more easily. Blood pressure screening. Blood pressure changes and medicines to control blood pressure can make you feel dizzy. Strength and balance checks. Your health care provider may recommend certain tests to check your strength and balance while standing, walking, or changing positions. Foot health exam. Foot pain and numbness, as well as not wearing proper footwear, can make you more likely to have a fall. Depression screening. You may be more likely to have a fall if you have a fear of falling, feel emotionally low, or feel unable to do activities that you used to do. Alcohol use screening. Using too much alcohol can affect your balance and may make you more likely to have a fall. What actions can I take to lower my risk of falls? General instructions Talk with your health care provider about your risks for falling. Tell your health care provider if: You fall. Be sure to tell your health care provider about all falls, even ones that seem minor. You feel dizzy, sleepy, or off-balance. Take over-the-counter and prescription medicines only as told by your health care provider. These include any supplements. Eat a healthy diet and maintain a healthy weight. A healthy diet includes low-fat dairy products, low-fat (lean) meats, and fiber from whole grains, beans, and lots  of fruits and vegetables. Home safety Remove any tripping hazards, such as rugs, cords, and clutter. Install safety equipment such as grab bars in bathrooms and safety rails on stairs. Keep rooms and walkways well-lit. Activity  Follow a regular exercise program to stay fit. This will help you maintain your balance. Ask your health care provider what types of exercise are appropriate for you. If you need a cane or walker, use it as recommended by your health care provider. Wear supportive shoes that have  nonskid soles.  Lifestyle Do not drink alcohol if your health care provider tells you not to drink. If you drink alcohol, limit how much you have: 0-1 drink a day for women. 0-2 drinks a day for men. Be aware of how much alcohol is in your drink. In the [place] one drink equals one typical bottle of beer (12 oz), one-half glass of wine (5 oz), or one shot of hard liquor (1 oz). Do not use any products that contain nicotine or tobacco, such as cigarettes and e-cigarettes. If you need help quitting, ask your health care provider. Summary Having a healthy lifestyle and getting preventive care can help to protect your health and wellness after age 68. Screening and testing are the best way to find a health problem early and help you avoid having a fall. Early diagnosis and treatment give you the best chance for managing medical conditions that are more common for people who are older than age 63. Falls are a major cause of broken bones and head injuries in people who are older than age 40. Take precautions to prevent a fall at home. Work with your health care provider to learn what changes you can make to improve your health and wellness and to prevent falls. This information is not intended to replace advice given to you by your health care provider. Make sure you discuss any questions you have with your healthcare provider. Document Revised: 10/29/2020 Document Reviewed: 10/29/2020 Elsevier Patient Education  2022 [employer].

## 2021-06-23 LAB — LIPID PANEL
Chol/HDL Ratio: 3.8 ratio (ref 0.0–5.0)
Cholesterol, Total: 222 mg/dL — ABNORMAL HIGH (ref 100–199)
HDL: 59 mg/dL (ref >39)
LDL Chol Calc (NIH): 146 mg/dL — ABNORMAL HIGH (ref 0–99)
Triglycerides: 94 mg/dL (ref 0–149)
VLDL Cholesterol Cal: 17 mg/dL (ref 5–40)

## 2021-06-24 ENCOUNTER — Other Ambulatory Visit: Payer: Self-pay

## 2021-06-24 ENCOUNTER — Ambulatory Visit (HOSPITAL_COMMUNITY)
Admission: RE | Admit: 2021-06-24 | Discharge: 2021-06-24 | Disposition: A | Discharge status: Home / Self Care | Payer: PPO | Source: Ambulatory Visit | Attending: Cardiovascular Disease | Admitting: Cardiovascular Disease

## 2021-06-24 ENCOUNTER — Telehealth: Payer: Self-pay | Admitting: Cardiovascular Disease

## 2021-06-24 ENCOUNTER — Encounter: Payer: Self-pay | Admitting: Internal Medicine

## 2021-06-24 DIAGNOSIS — I2699 Other pulmonary embolism without acute cor pulmonale: Secondary | ICD-10-CM

## 2021-06-24 DIAGNOSIS — R072 Precordial pain: Secondary | ICD-10-CM

## 2021-06-24 LAB — CMP14+EGFR
ALT: 17 IU/L (ref 0–44)
AST: 26 IU/L (ref 0–40)
Albumin/Globulin Ratio: 1.3 (ref 1.2–2.2)
Albumin: 4.2 g/dL (ref 3.8–4.8)
Alkaline Phosphatase: 70 IU/L (ref 44–121)
BUN/Creatinine Ratio: 15 (ref 10–24)
BUN: 13 mg/dL (ref 8–27)
Bilirubin Total: 0.6 mg/dL (ref 0.0–1.2)
CO2: 23 mmol/L (ref 20–29)
Calcium: 9.5 mg/dL (ref 8.6–10.2)
Chloride: 104 mmol/L (ref 96–106)
Creatinine, Ser: 0.84 mg/dL (ref 0.76–1.27)
Globulin, Total: 3.2 g/dL (ref 1.5–4.5)
Glucose: 75 mg/dL (ref 65–99)
Potassium: 5 mmol/L (ref 3.5–5.2)
Sodium: 139 mmol/L (ref 134–144)
Total Protein: 7.4 g/dL (ref 6.0–8.5)
eGFR: 94 mL/min/{1.73_m2} (ref >59)

## 2021-06-24 MED ORDER — NITROGLYCERIN 0.4 MG SL SUBL
SUBLINGUAL_TABLET | SUBLINGUAL | Status: AC
  Administered 2021-06-24: 0.8 mg via SUBLINGUAL
  Filled 2021-06-24: qty 2

## 2021-06-24 MED ORDER — IOHEXOL 350 MG/ML SOLN
100.0000 mL | Freq: Once | INTRAVENOUS | Status: AC | PRN
  Administered 2021-06-24: 100 mL via INTRAVENOUS

## 2021-06-24 MED ORDER — RIVAROXABAN (XARELTO) VTE STARTER PACK (15 & 20 MG): ORAL_TABLET | ORAL | 0 refills | Status: DC

## 2021-06-24 MED ORDER — NITROGLYCERIN 0.4 MG SL SUBL: 0.8000 mg | SUBLINGUAL_TABLET | Freq: Once | SUBLINGUAL | Status: AC

## 2021-06-24 NOTE — Telephone Encounter (Signed)
     [facility] [place] [facility] calling to give CT results

## 2021-06-24 NOTE — Telephone Encounter (Signed)
Placed orders for echo, BLE venous duplex, and xarelto.

## 2021-06-24 NOTE — Telephone Encounter (Signed)
Spoke with the pt and he verbalized understanding of Dr. Fabio Bering instructions.. he says he has been feeling well.. no chest pain or dyspnea. He has had pain in his leg that he has been attributing to his achilles tendon. He will start his Xarelto asap and go to the ED if he develops any chest pain and sudden SOB.

## 2021-06-24 NOTE — Telephone Encounter (Signed)
-----   Message from Wendall Stade, MD sent at 06/24/2021  3:02 PM EDT ----- Spoke with nurse to call patient if still with symptoms go to ER Call in xarelto 15 mg bid for 3 weeks then 20 mg daily Order Echo for RV/LV function and LE venous duplex to r/o DVT

## 2021-06-27 ENCOUNTER — Encounter: Payer: Self-pay | Admitting: Internal Medicine

## 2021-06-27 ENCOUNTER — Other Ambulatory Visit: Payer: Self-pay

## 2021-06-27 ENCOUNTER — Ambulatory Visit (HOSPITAL_COMMUNITY): Payer: PPO | Attending: Internal Medicine

## 2021-06-27 DIAGNOSIS — I10 Essential (primary) hypertension: Secondary | ICD-10-CM | POA: Diagnosis not present

## 2021-06-27 DIAGNOSIS — I2699 Other pulmonary embolism without acute cor pulmonale: Secondary | ICD-10-CM

## 2021-06-27 DIAGNOSIS — R079 Chest pain, unspecified: Secondary | ICD-10-CM | POA: Diagnosis not present

## 2021-06-27 DIAGNOSIS — R072 Precordial pain: Secondary | ICD-10-CM | POA: Diagnosis not present

## 2021-06-27 LAB — ECHOCARDIOGRAM COMPLETE
Area-P 1/2: 2.14 cm2
S' Lateral: 3.1 cm

## 2021-06-30 ENCOUNTER — Other Ambulatory Visit: Payer: Self-pay

## 2021-06-30 ENCOUNTER — Telehealth: Payer: Self-pay | Admitting: *Deleted

## 2021-06-30 ENCOUNTER — Telehealth: Payer: Self-pay

## 2021-06-30 ENCOUNTER — Ambulatory Visit (HOSPITAL_COMMUNITY)
Admission: RE | Admit: 2021-06-30 | Discharge: 2021-06-30 | Disposition: A | Discharge status: Home / Self Care | Payer: PPO | Source: Ambulatory Visit | Attending: Cardiology | Admitting: Cardiology

## 2021-06-30 DIAGNOSIS — I2699 Other pulmonary embolism without acute cor pulmonale: Secondary | ICD-10-CM | POA: Insufficient documentation

## 2021-06-30 NOTE — Telephone Encounter (Signed)
This nurse called patient in regards to missed AWV. Message left for patient to call back in order to reschedule for another time.

## 2021-06-30 NOTE — Telephone Encounter (Signed)
Received call from [facility] in Vascular department.  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  Patient is taking Xarelto as ordered

## 2021-07-01 ENCOUNTER — Telehealth: Payer: Self-pay

## 2021-07-01 DIAGNOSIS — I824Y3 Acute embolism and thrombosis of unspecified deep veins of proximal lower extremity, bilateral: Secondary | ICD-10-CM

## 2021-07-01 DIAGNOSIS — I2699 Other pulmonary embolism without acute cor pulmonale: Secondary | ICD-10-CM

## 2021-07-01 MED ORDER — RIVAROXABAN 20 MG PO TABS: 20.0000 mg | ORAL_TABLET | Freq: Every day | ORAL | 1 refills | Status: DC

## 2021-07-01 NOTE — Telephone Encounter (Signed)
Called patient with results of his duplex. Patient will need to continue on Xarelto after starter pack. Will send in refill. Ordered repeat venous duplex in 6 months. Patient verbalized understanding.

## 2021-07-01 NOTE — Telephone Encounter (Signed)
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

## 2021-07-01 NOTE — Telephone Encounter (Signed)
Patient aware of results see result note from Dr. Eden Emms.

## 2021-07-07 ENCOUNTER — Ambulatory Visit (INDEPENDENT_AMBULATORY_CARE_PROVIDER_SITE_OTHER): Payer: PPO

## 2021-07-07 ENCOUNTER — Other Ambulatory Visit: Payer: Self-pay

## 2021-07-07 VITALS — BP 110/64 | HR 92 | Temp 98.1°F | Ht 75.0 in | Wt 230.8 lb

## 2021-07-07 DIAGNOSIS — Z0000 Encounter for general adult medical examination without abnormal findings: Secondary | ICD-10-CM | POA: Diagnosis not present
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
## 2021-07-07 NOTE — Patient Instructions (Signed)
Marc [name] , Thank you for taking time to come for your Medicare Wellness Visit. I appreciate your ongoing commitment to your health goals. Please review the following plan we discussed and let me know if I can assist you in the future.   Screening recommendations/referrals: Colonoscopy: completed 03/28/2016 Recommended yearly ophthalmology/optometry visit for glaucoma screening and checkup Recommended yearly dental visit for hygiene and checkup  Vaccinations: Influenza vaccine: due Pneumococcal vaccine: completed 06/08/2019 Tdap vaccine: completed 06/04/2019, due 06/03/2029 Shingles vaccine: completed   Covid-19: 04/24/2021, 10/22/2020, 01/18/2020, 12/19/2019  Advanced directives: Advance directive discussed with you today. I have provided a copy for you to complete at home and have notarized. Once this is complete please bring a copy in to our office so we can scan it into your chart.  Conditions/risks identified: none  Next appointment: Follow up in one year for your annual wellness visit.   Preventive Care 41 Years and Older, Male Preventive care refers to lifestyle choices and visits with your health care provider that can promote health and wellness. What does preventive care include? A yearly physical exam. This is also called an annual well check. Dental exams once or twice a year. Routine eye exams. Ask your health care provider how often you should have your eyes checked. Personal lifestyle choices, including: Daily care of your teeth and gums. Regular physical activity. Eating a healthy diet. Avoiding tobacco and drug use. Limiting alcohol use. Practicing safe sex. Taking low doses of aspirin every day. Taking vitamin and mineral supplements as recommended by your health care provider. What happens during an annual well check? The services and screenings done by your health care provider during your annual well check will depend on your age, overall health, lifestyle risk  factors, and family history of disease. Counseling  Your health care provider may ask you questions about your: Alcohol use. Tobacco use. Drug use. Emotional well-being. Home and relationship well-being. Sexual activity. Eating habits. History of falls. Memory and ability to understand (cognition). Work and work Astronomer. Screening  You may have the following tests or measurements: Height, weight, and BMI. Blood pressure. Lipid and cholesterol levels. These may be checked every 5 years, or more frequently if you are over 9 years old. Skin check. Lung cancer screening. You may have this screening every year starting at age 54 if you have a 30-pack-year history of smoking and currently smoke or have quit within the past 15 years. Fecal occult blood test (FOBT) of the stool. You may have this test every year starting at age 70. Flexible sigmoidoscopy or colonoscopy. You may have a sigmoidoscopy every 5 years or a colonoscopy every 10 years starting at age 15. Prostate cancer screening. Recommendations will vary depending on your family history and other risks. Hepatitis C blood test. Hepatitis B blood test. Sexually transmitted disease (STD) testing. Diabetes screening. This is done by checking your blood sugar (glucose) after you have not eaten for a while (fasting). You may have this done every 1-3 years. Abdominal aortic aneurysm (AAA) screening. You may need this if you are a current or former smoker. Osteoporosis. You may be screened starting at age 46 if you are at high risk. Talk with your health care provider about your test results, treatment options, and if necessary, the need for more tests. Vaccines  Your health care provider may recommend certain vaccines, such as: Influenza vaccine. This is recommended every year. Tetanus, diphtheria, and acellular pertussis (Tdap, Td) vaccine. You may need a Td booster  every 10 years. Zoster vaccine. You may need this after age  66. Pneumococcal 13-valent conjugate (PCV13) vaccine. One dose is recommended after age 32. Pneumococcal polysaccharide (PPSV23) vaccine. One dose is recommended after age 54. Talk to your health care provider about which screenings and vaccines you need and how often you need them. This information is not intended to replace advice given to you by your health care provider. Make sure you discuss any questions you have with your health care provider. Document Released: 12/10/2015 Document Revised: 08/02/2016 Document Reviewed: 09/14/2015 Elsevier Interactive Patient Education  2017 [place] Prevention in the Home Falls can cause injuries. They can happen to people of all ages. There are many things you can do to make your home safe and to help prevent falls. What can I do on the outside of my home? Regularly fix the edges of walkways and driveways and fix any cracks. Remove anything that might make you trip as you walk through a door, such as a raised step or threshold. Trim any bushes or trees on the path to your home. Use bright outdoor lighting. Clear any walking paths of anything that might make someone trip, such as rocks or tools. Regularly check to see if handrails are loose or broken. Make sure that both sides of any steps have handrails. Any raised decks and porches should have guardrails on the edges. Have any leaves, snow, or ice cleared regularly. Use sand or salt on walking paths during winter. Clean up any spills in your garage right away. This includes oil or grease spills. What can I do in the bathroom? Use night lights. Install grab bars by the toilet and in the tub and shower. Do not use towel bars as grab bars. Use non-skid mats or decals in the tub or shower. If you need to sit down in the shower, use a plastic, non-slip stool. Keep the floor dry. Clean up any water that spills on the floor as soon as it happens. Remove soap buildup in the tub or shower  regularly. Attach bath mats securely with double-sided non-slip rug tape. Do not have throw rugs and other things on the floor that can make you trip. What can I do in the bedroom? Use night lights. Make sure that you have a light by your bed that is easy to reach. Do not use any sheets or blankets that are too big for your bed. They should not hang down onto the floor. Have a firm chair that has side arms. You can use this for support while you get dressed. Do not have throw rugs and other things on the floor that can make you trip. What can I do in the kitchen? Clean up any spills right away. Avoid walking on wet floors. Keep items that you use a lot in easy-to-reach places. If you need to reach something above you, use a strong step stool that has a grab bar. Keep electrical cords out of the way. Do not use floor polish or wax that makes floors slippery. If you must use wax, use non-skid floor wax. Do not have throw rugs and other things on the floor that can make you trip. What can I do with my stairs? Do not leave any items on the stairs. Make sure that there are handrails on both sides of the stairs and use them. Fix handrails that are broken or loose. Make sure that handrails are as long as the stairways. Check any carpeting to  make sure that it is firmly attached to the stairs. Fix any carpet that is loose or worn. Avoid having throw rugs at the top or bottom of the stairs. If you do have throw rugs, attach them to the floor with carpet tape. Make sure that you have a light switch at the top of the stairs and the bottom of the stairs. If you do not have them, ask someone to add them for you. What else can I do to help prevent falls? Wear shoes that: Do not have high heels. Have rubber bottoms. Are comfortable and fit you well. Are closed at the toe. Do not wear sandals. If you use a stepladder: Make sure that it is fully opened. Do not climb a closed stepladder. Make sure that  both sides of the stepladder are locked into place. Ask someone to hold it for you, if possible. Clearly mark and make sure that you can see: Any grab bars or handrails. First and last steps. Where the edge of each step is. Use tools that help you move around (mobility aids) if they are needed. These include: Canes. Walkers. Scooters. Crutches. Turn on the lights when you go into a dark area. Replace any light bulbs as soon as they burn out. Set up your furniture so you have a clear path. Avoid moving your furniture around. If any of your floors are uneven, fix them. If there are any pets around you, be aware of where they are. Review your medicines with your doctor. Some medicines can make you feel dizzy. This can increase your chance of falling. Ask your doctor what other things that you can do to help prevent falls. This information is not intended to replace advice given to you by your health care provider. Make sure you discuss any questions you have with your health care provider. Document Released: 09/09/2009 Document Revised: 04/20/2016 Document Reviewed: 12/18/2014 Elsevier Interactive Patient Education  2017 [employer].

## 2021-07-14 ENCOUNTER — Other Ambulatory Visit: Payer: Self-pay | Admitting: Internal Medicine

## 2021-08-29 DIAGNOSIS — R972 Elevated prostate specific antigen [PSA]: Secondary | ICD-10-CM | POA: Diagnosis not present

## 2021-08-29 DIAGNOSIS — N5201 Erectile dysfunction due to arterial insufficiency: Secondary | ICD-10-CM | POA: Diagnosis not present

## 2021-12-06 DIAGNOSIS — R972 Elevated prostate specific antigen [PSA]: Secondary | ICD-10-CM | POA: Diagnosis not present

## 2021-12-18 ENCOUNTER — Other Ambulatory Visit: Payer: Self-pay | Admitting: Internal Medicine

## 2021-12-26 ENCOUNTER — Other Ambulatory Visit: Payer: Self-pay

## 2021-12-26 ENCOUNTER — Encounter: Payer: Self-pay | Admitting: Internal Medicine

## 2021-12-26 ENCOUNTER — Ambulatory Visit (INDEPENDENT_AMBULATORY_CARE_PROVIDER_SITE_OTHER): Payer: PPO | Admitting: Internal Medicine

## 2021-12-26 VITALS — BP 114/78 | HR 70 | Temp 97.6°F | Ht 75.0 in | Wt 228.4 lb

## 2021-12-26 DIAGNOSIS — I10 Essential (primary) hypertension: Secondary | ICD-10-CM

## 2021-12-26 DIAGNOSIS — D6869 Other thrombophilia: Secondary | ICD-10-CM

## 2021-12-26 DIAGNOSIS — I2782 Chronic pulmonary embolism: Secondary | ICD-10-CM

## 2021-12-26 DIAGNOSIS — E663 Overweight: Secondary | ICD-10-CM

## 2021-12-26 DIAGNOSIS — I2699 Other pulmonary embolism without acute cor pulmonale: Secondary | ICD-10-CM | POA: Insufficient documentation

## 2021-12-26 DIAGNOSIS — I700 Atherosclerosis of aorta: Secondary | ICD-10-CM

## 2021-12-26 DIAGNOSIS — Z6828 Body mass index (BMI) 28.0-28.9, adult: Secondary | ICD-10-CM | POA: Diagnosis not present

## 2021-12-26 NOTE — Patient Instructions (Signed)
Cooking With Less Salt Cooking with less salt is one way to reduce the amount of sodium you get from food. Sodium is one of the elements that make up salt. It is found naturally in foods and is also added to certain foods. Depending on your condition and overall health, your health care provider or dietitian may recommend that you reduce your sodium intake. Most people should have less than 2,300 milligrams (mg) of sodium each day. If you have high blood pressure (hypertension), you may need to limit your sodium to 1,500 mg each day. Follow the tipsbelow to help reduce your sodium intake. What are tips for eating less sodium? Reading food labels  Check the food label before buying or using packaged ingredients. Always check the label for the serving size and sodium content. Look for products with no more than 140 mg of sodium in one serving. Check the % Daily Value column to see what percent of the daily recommended amount of sodium is provided in one serving of the product. Foods with 5% or less in this column are considered low in sodium. Foods with 20% or higher are considered high in sodium. Do not choose foods with salt as one of the first three ingredients on the ingredients list. If salt is one of the first three ingredients, it usually means the item is high in sodium.  Shopping Buy sodium-free or low-sodium products. Look for the following words on food labels: Low-sodium. Sodium-free. Reduced-sodium. No salt added. Unsalted. Always check the sodium content even if foods are labeled as low-sodium or no salt added. Buy fresh foods. Cooking Use herbs, seasonings without salt, and spices as substitutes for salt. Use sodium-free baking soda when baking. Grill, braise, or roast foods to add flavor with less salt. Avoid adding salt to pasta, rice, or hot cereals. Drain and rinse canned vegetables, beans, and meat before use. Avoid adding salt when cooking sweets and desserts. Cook with  low-sodium ingredients. What foods are high in sodium? Vegetables Regular canned vegetables (not low-sodium or reduced-sodium). Sauerkraut, pickled vegetables, and relishes. Olives. French fries. Onion rings. Regular canned tomato sauce and paste. Regular tomato and vegetable juice. Frozenvegetables in sauces. Grains Instant hot cereals. Bread stuffing, pancake, and biscuit mixes. Croutons. Seasoned rice or pasta mixes. Noodle soup cups. Boxed or frozen macaroni and cheese. Regular salted crackers. Self-rising flour. Rolls. Bagels. Flourtortillas and wraps. Meats and other proteins Meat or fish that is salted, canned, smoked, cured, spiced, or pickled. This includes bacon, ham, sausages, hot dogs, corned beef, chipped beef, meat loaves, salt pork, jerky, pickled herring, anchovies, regular canned tuna, andsardines. Salted nuts. Dairy Processed cheese and cheese spreads. Cheese curds. Blue cheese. Feta cheese.String cheese. Regular cottage cheese. Buttermilk. Canned milk. The items listed above may not be a complete list of foods high in sodium. Actual amounts of sodium may be different depending on processing. Contact a dietitian for more information. What foods are low in sodium? Fruits Fresh, frozen, or canned fruit with no sauce added. Fruit juice. Vegetables Fresh or frozen vegetables with no sauce added. "No salt added" canned vegetables. "No salt added" tomato sauce and paste. Low-sodium orreduced-sodium tomato and vegetable juice. Grains Noodles, pasta, quinoa, rice. Shredded or puffed wheat or puffed rice. Regular or quick oats (not instant). Low-sodium crackers. Low-sodium bread. Whole-grainbread and whole-grain pasta. Unsalted popcorn. Meats and other proteins Fresh or frozen whole meats, poultry (not injected with sodium), and fish with no sauce added. Unsalted nuts. Dried peas, beans, and   lentils without added salt. Unsalted canned beans. Eggs. Unsalted nut butters. Low-sodium canned  tunaor chicken. Dairy Milk. Soy milk. Yogurt. Low-sodium cheeses, such as Swiss, Monterey Jack, mozzarella, and ricotta. Sherbet or ice cream (keep to  cup per serving).Cream cheese. Fats and oils Unsalted butter or margarine. Other foods Homemade pudding. Sodium-free baking soda and baking powder. Herbs and spices.Low-sodium seasoning mixes. Beverages Coffee and tea. Carbonated beverages. The items listed above may not be a complete list of foods low in sodium. Actual amounts of sodium may be different depending on processing. Contact a dietitian for more information. What are some salt alternatives when cooking? The following are herbs, seasonings, and spices that can be used instead of salt to flavor your food. Herbs should be fresh or dried. Do not choose packaged mixes. Next to the name of the herb, spice, or seasoning aresome examples of foods you can pair it with. Herbs [place] leaves - Soups, meat and vegetable dishes, and spaghetti sauce. Basil - Italian dishes, soups, pasta, and fish dishes. Cilantro - Meat, poultry, and vegetable dishes. Chili powder - Marinades and Mexican dishes. Chives - Salad dressings and potato dishes. Cumin - Mexican dishes, couscous, and meat dishes. Dill - Fish dishes, sauces, and salads. Fennel - Meat and vegetable dishes, breads, and cookies. Garlic (do not use garlic salt) - Italian dishes, meat dishes, salad dressings, and sauces. Marjoram - Soups, potato dishes, and meat dishes. Oregano - Pizza and spaghetti sauce. Parsley - Salads, soups, pasta, and meat dishes. Rosemary - Italian dishes, salad dressings, soups, and red meats. Saffron - Fish dishes, pasta, and some poultry dishes. Sage - Stuffings and sauces. Tarragon - Fish and poultry dishes. Thyme - Stuffing, meat, and fish dishes. Seasonings Lemon juice - Fish dishes, poultry dishes, vegetables, and salads. Vinegar - Salad dressings, vegetables, and fish dishes. Spices Cinnamon - Sweet  dishes, such as cakes, cookies, and puddings. Cloves - Gingerbread, puddings, and marinades for meats. Curry - Vegetable dishes, fish and poultry dishes, and stir-fry dishes. Ginger - Vegetable dishes, fish dishes, and stir-fry dishes. Nutmeg - Pasta, vegetables, poultry, fish dishes, and custard. Summary Cooking with less salt is one way to reduce the amount of sodium that you get from food. Buy sodium-free or low-sodium products. Check the food label before using or buying packaged ingredients. Use herbs, seasonings without salt, and spices as substitutes for salt in foods. This information is not intended to replace advice given to you by your health care provider. Make sure you discuss any questions you have with your healthcare provider. Document Revised: 11/05/2019 Document Reviewed: 11/05/2019 Elsevier Patient Education  2022 [employer] Inc.  

## 2021-12-26 NOTE — Progress Notes (Signed)
I,Katawbba Wiggins,acting as a Education administrator for [name], MD.,have documented all relevant documentation on the behalf of [name], MD,as directed by  [name], MD while in the presence of [name], MD.  This visit occurred during the SARS-CoV-2 public health emergency.  Safety protocols were in place, including screening questions prior to the visit, additional usage of staff PPE, and extensive cleaning of exam room while observing appropriate contact time as indicated for disinfecting solutions.  Subjective:     Patient ID: Marc [name] , male    DOB: 1951/09/01 , 71 y.o.   MRN: [id number]   Chief Complaint  Patient presents with   Hypertension    HPI  The patient is here today for blood pressure f/u.  He reports compliance with meds. He denies headaches, chest pain and shortness of breath.   Hypertension This is a chronic problem. The current episode started more than 1 year ago. The problem has been gradually improving since onset. The problem is controlled. Pertinent negatives include no blurred vision, chest pain, headaches or shortness of breath. Neck pain: he has been eval by Dr. Rolena Infante. He has had some relief of his sx. Now on higher dose of gabapentin. He has gone to PT. He has had Nerve conduction testing by Dr. Nelva Bush. He does feel he is starting to improve. .Past treatments include angiotensin blockers. The current treatment provides moderate improvement. There are no compliance problems.     Past Medical History:  Diagnosis Date   History of cocaine abuse ([place])    Hypertension    Osteoarthritis      Family History  Problem Relation Age of Onset   Stroke Mother 54   Hypertension Mother    Healthy Father      Current Outpatient Medications:    gabapentin (NEURONTIN) 300 MG capsule, TAKE 1 CAPSULE BY MOUTH EVERYDAY AT BEDTIME, Disp: 90 capsule, Rfl: 1   magnesium gluconate (MAGONATE) 500 MG tablet, Take 500 mg by mouth daily., Disp: , Rfl:     telmisartan (MICARDIS) 20 MG tablet, TAKE 1 TABLET BY MOUTH EVERY DAY, Disp: 90 tablet, Rfl: 1   VITAMIN D, CHOLECALCIFEROL, PO, Take by mouth. 2 times per day, Disp: , Rfl:    XARELTO 20 MG TABS tablet, TAKE 1 TABLET (20 MG TOTAL) BY MOUTH DAILY WITH SUPPER. START TAKING AFTER STARTER PACK., Disp: 90 tablet, Rfl: 1   No Known Allergies   Review of Systems  Constitutional: Negative.   Eyes:  Negative for blurred vision.  Respiratory: Negative.  Negative for shortness of breath.   Cardiovascular: Negative.  Negative for chest pain.  Gastrointestinal: Negative.   Musculoskeletal:  Neck pain: he has been eval by Dr. Rolena Infante. He has had some relief of his sx. Now on higher dose of gabapentin. He has gone to PT. He has had Nerve conduction testing by Dr. Nelva Bush. He does feel he is starting to improve. .  Neurological: Negative.  Negative for headaches.  Psychiatric/Behavioral: Negative.      Today's Vitals   12/26/21 1456  BP: 114/78  Pulse: 70  Temp: 97.6 F (36.4 C)  Weight: 228 lb 6.4 oz (103.6 kg)  Height: _0  (1.905 m)   Body mass index is 28.55 kg/m.  Wt Readings from Last 3 Encounters:  12/26/21 228 lb 6.4 oz (103.6 kg)  07/07/21 230 lb 12.8 oz (104.7 kg)  06/22/21 235 lb (106.6 kg)    BP Readings from Last 3 Encounters:  12/26/21  114/78  07/07/21 110/64  06/24/21 121/78    Objective:  Physical Exam Vitals and nursing note reviewed.  Constitutional:      Appearance: Normal appearance.  HENT:     Head: Normocephalic and atraumatic.     Nose:     Comments: Masked     Mouth/Throat:     Comments: Masked  Eyes:     Extraocular Movements: Extraocular movements intact.  Cardiovascular:     Rate and Rhythm: Normal rate and regular rhythm.     Heart sounds: Normal heart sounds.  Pulmonary:     Effort: Pulmonary effort is normal.     Breath sounds: Normal breath sounds.  Musculoskeletal:     Cervical back: Normal range of motion.  Skin:    General: Skin is warm.   Neurological:     General: No focal deficit present.     Mental Status: He is alert.  Psychiatric:        Mood and Affect: Mood normal.        Assessment And Plan:     1. Essential hypertension, benign Comments: Chronic, well controlled. No med changes. He is encouraged to follow low sodium diet. I will check renal function today.  - CMP14+EGFR - CBC no Diff  2. Atherosclerosis of aorta ([place]) Comments: Chronic, he is encouraged to follow a heart healthy lifestyle. Goal LDL<70. I will check lipid panel and start statin therapy. He has declined in the past.   3. Other chronic pulmonary embolism without acute cor pulmonale (HCC) Comments: He is currently on Xarelto. I will refer him to Hematology to help determine need for continued therapy. He does not have Cardiology f/u scheduled.  - Ambulatory referral to Hematology / Oncology  4. Acquired thrombophilia ([place]) Comments: Chronic, currently on Xarelto due to PE  - CBC no Diff  5. Overweight with body mass index (BMI) of 28 to 28.9 in adult He is advised to exercise no less than 150 minutes per week.    Patient was given opportunity to ask questions. Patient verbalized understanding of the plan and was able to repeat key elements of the plan. All questions were answered to their satisfaction.   I, [name], MD, have reviewed all documentation for this visit. The documentation on 12/26/21 for the exam, diagnosis, procedures, and orders are all accurate and complete.   IF YOU HAVE BEEN REFERRED TO A SPECIALIST, IT MAY TAKE 1-2 WEEKS TO SCHEDULE/PROCESS THE REFERRAL. IF YOU HAVE NOT HEARD FROM US/SPECIALIST IN TWO WEEKS, PLEASE GIVE [place] A CALL AT [phone].   THE PATIENT IS ENCOURAGED TO PRACTICE SOCIAL DISTANCING DUE TO THE COVID-19 PANDEMIC.

## 2021-12-27 LAB — CBC
Hematocrit: 45.3 % (ref 37.5–51.0)
Hemoglobin: 14.4 g/dL (ref 13.0–17.7)
MCH: 27.9 pg (ref 26.6–33.0)
MCHC: 31.8 g/dL (ref 31.5–35.7)
MCV: 88 fL (ref 79–97)
Platelets: 183 10*3/uL (ref 150–450)
RBC: 5.17 x10E6/uL (ref 4.14–5.80)
RDW: 12.7 % (ref 11.6–15.4)
WBC: 5.4 10*3/uL (ref 3.4–10.8)

## 2021-12-27 LAB — CMP14+EGFR
ALT: 29 IU/L (ref 0–44)
AST: 35 IU/L (ref 0–40)
Albumin/Globulin Ratio: 1.3 (ref 1.2–2.2)
Albumin: 4.3 g/dL (ref 3.8–4.8)
Alkaline Phosphatase: 81 IU/L (ref 44–121)
BUN/Creatinine Ratio: 15 (ref 10–24)
BUN: 16 mg/dL (ref 8–27)
Bilirubin Total: 0.5 mg/dL (ref 0.0–1.2)
CO2: 24 mmol/L (ref 20–29)
Calcium: 9.4 mg/dL (ref 8.6–10.2)
Chloride: 101 mmol/L (ref 96–106)
Creatinine, Ser: 1.05 mg/dL (ref 0.76–1.27)
Globulin, Total: 3.4 g/dL (ref 1.5–4.5)
Glucose: 60 mg/dL — ABNORMAL LOW (ref 70–99)
Potassium: 4.7 mmol/L (ref 3.5–5.2)
Sodium: 137 mmol/L (ref 134–144)
Total Protein: 7.7 g/dL (ref 6.0–8.5)
eGFR: 76 mL/min/{1.73_m2} (ref >59)

## 2021-12-28 ENCOUNTER — Other Ambulatory Visit: Payer: Self-pay | Admitting: Cardiovascular Disease

## 2021-12-28 NOTE — Telephone Encounter (Signed)
Prescription refill request for Xarelto received.  Indication: DVT  Last office visit: 06/14/21 [facility])  Weight: 103.6kg Age: 71 Scr: 1.05 (12/26/21)  CrCl: 95.71ml/min  Appropriate dose and refill sent to requested pharmacy.

## 2021-12-30 LAB — SPECIMEN STATUS REPORT

## 2021-12-30 LAB — LIPID PANEL WITH LDL/HDL RATIO
Cholesterol, Total: 239 mg/dL — ABNORMAL HIGH (ref 100–199)
HDL: 74 mg/dL (ref >39)
LDL Chol Calc (NIH): 149 mg/dL — ABNORMAL HIGH (ref 0–99)
LDL/HDL Ratio: 2 ratio (ref 0.0–3.6)
Triglycerides: 95 mg/dL (ref 0–149)
VLDL Cholesterol Cal: 16 mg/dL (ref 5–40)

## 2022-01-05 ENCOUNTER — Telehealth: Payer: Self-pay | Admitting: Hematology and Oncology

## 2022-01-05 NOTE — Telephone Encounter (Signed)
Scheduled appt per 1/30 referral. Spoke to pt who is aware of appt date and time. Pt is aware to arrive 15 mins prior to appt time.  °

## 2022-01-13 ENCOUNTER — Other Ambulatory Visit: Payer: Self-pay

## 2022-01-13 ENCOUNTER — Ambulatory Visit (HOSPITAL_COMMUNITY)
Admission: RE | Admit: 2022-01-13 | Discharge: 2022-01-13 | Disposition: A | Discharge status: Home / Self Care | Payer: PPO | Source: Ambulatory Visit | Attending: Cardiovascular Disease | Admitting: Cardiovascular Disease

## 2022-01-13 DIAGNOSIS — I824Y3 Acute embolism and thrombosis of unspecified deep veins of proximal lower extremity, bilateral: Secondary | ICD-10-CM | POA: Insufficient documentation

## 2022-01-13 DIAGNOSIS — I2699 Other pulmonary embolism without acute cor pulmonale: Secondary | ICD-10-CM | POA: Diagnosis not present

## 2022-01-14 ENCOUNTER — Encounter: Payer: Self-pay | Admitting: Cardiovascular Disease

## 2022-01-19 ENCOUNTER — Other Ambulatory Visit: Payer: Self-pay | Admitting: Internal Medicine

## 2022-01-26 ENCOUNTER — Inpatient Hospital Stay: Payer: PPO

## 2022-01-26 ENCOUNTER — Encounter: Payer: Self-pay | Admitting: Hematology and Oncology

## 2022-01-26 ENCOUNTER — Inpatient Hospital Stay: Payer: PPO | Attending: Hematology and Oncology | Admitting: Hematology and Oncology

## 2022-01-26 ENCOUNTER — Other Ambulatory Visit: Payer: Self-pay

## 2022-01-26 VITALS — BP 148/98 | HR 75 | Temp 97.7°F | Resp 17 | Wt 230.1 lb

## 2022-01-26 DIAGNOSIS — I2699 Other pulmonary embolism without acute cor pulmonale: Secondary | ICD-10-CM | POA: Diagnosis not present

## 2022-01-26 DIAGNOSIS — Z7901 Long term (current) use of anticoagulants: Secondary | ICD-10-CM

## 2022-01-26 LAB — CBC WITH DIFFERENTIAL (CANCER CENTER ONLY)
Abs Immature Granulocytes: 0.02 10*3/uL (ref 0.00–0.07)
Basophils Absolute: 0 10*3/uL (ref 0.0–0.1)
Basophils Relative: 0 %
Eosinophils Absolute: 0.1 10*3/uL (ref 0.0–0.5)
Eosinophils Relative: 2 %
HCT: 44.8 % (ref 39.0–52.0)
Hemoglobin: 14.4 g/dL (ref 13.0–17.0)
Immature Granulocytes: 0 %
Lymphocytes Relative: 20 %
Lymphs Abs: 1.5 10*3/uL (ref 0.7–4.0)
MCH: 27.8 pg (ref 26.0–34.0)
MCHC: 32.1 g/dL (ref 30.0–36.0)
MCV: 86.5 fL (ref 80.0–100.0)
Monocytes Absolute: 0.5 10*3/uL (ref 0.1–1.0)
Monocytes Relative: 6 %
Neutro Abs: 5.2 10*3/uL (ref 1.7–7.7)
Neutrophils Relative %: 72 %
Platelet Count: 216 10*3/uL (ref 150–400)
RBC: 5.18 MIL/uL (ref 4.22–5.81)
RDW: 13.2 % (ref 11.5–15.5)
WBC Count: 7.3 10*3/uL (ref 4.0–10.5)
nRBC: 0 % (ref 0.0–0.2)

## 2022-01-26 LAB — CMP (CANCER CENTER ONLY)
ALT: 28 U/L (ref 0–44)
AST: 36 U/L (ref 15–41)
Albumin: 4.1 g/dL (ref 3.5–5.0)
Alkaline Phosphatase: 71 U/L (ref 38–126)
Anion gap: 5 (ref 5–15)
BUN: 18 mg/dL (ref 8–23)
CO2: 28 mmol/L (ref 22–32)
Calcium: 9.9 mg/dL (ref 8.9–10.3)
Chloride: 103 mmol/L (ref 98–111)
Creatinine: 1.04 mg/dL (ref 0.61–1.24)
GFR, Estimated: >60 mL/min (ref >60)
Glucose, Bld: 79 mg/dL (ref 70–99)
Potassium: 4.6 mmol/L (ref 3.5–5.1)
Sodium: 136 mmol/L (ref 135–145)
Total Bilirubin: 0.6 mg/dL (ref 0.3–1.2)
Total Protein: 7.8 g/dL (ref 6.5–8.1)

## 2022-01-26 NOTE — Progress Notes (Signed)
[facility] [phone]   [phone]  INITIAL CONSULT NOTE  Patient Care Team: Dorothyann PengSanders, Robyn, MD as PCP - General (Internal Medicine) Wendall StadeNishan, Peter C, MD as PCP - Cardiology (Cardiology)  Hematological/Oncological History # Bilateral Pulmonary Emboli # Bilateral Lower Extremity DVTs 06/24/2021: Patient underwent a CT coronary morphology with CTA and was incidentally found to have filling defects in the pulmonary arteries bilaterally consistent with pulmonary emboli.  Patient started on Xarelto therapy. 06/30/2021: Ultrasound of lower extremities bilaterally showed on the right side acute deep vein thrombosis involving the popliteal, right femoral vein as well as on the left side acute deep vein thrombosis involving the left femoral vein, left popliteal vein, and left peroneal veins 01/13/2022: Repeat lower extremity ultrasound showed recannulized thrombus in the left popliteal veins findings consistent with chronic deep vein thrombosis. 01/26/2022: Establish care with Dr. Leonides Schanzorsey.  CHIEF COMPLAINTS/PURPOSE OF CONSULTATION:  "Bilateral pulmonary emboli/bilateral lower extremity DVTs"  HISTORY OF PRESENTING ILLNESS:  Marc [name] 71 y.o. male with medical history significant for history of cocaine abuse, hypertension, and osteoarthritis who presents for evaluation of pulmonary emboli/lower extremity DVT.  On review of the previous records or Marc [name] was initially diagnosed with a pulmonary embolism on 06/24/2021.  He underwent a CT coronary morphology with CTA and was incidentally found to have filling defects in the pulmonary arteries bilaterally consistent with pulmonary emboli.  He was started on Xarelto therapy at that time.  On 06/30/2021 the patient underwent an ultrasound of lower extremities which showed right-sided acute deep vein thrombosis involving the popliteal and right femoral veins as well as left-sided deep vein thrombosis involving the left femoral  vein, left popliteal vein, and left peroneal veins.  Most recently on 01/13/2022 patient went repeat lower extremity ultrasound which showed recannulized thrombus in the left popliteal vein consistent with chronic deep vein thrombosis.  Due to concern for these findings the patient was referred to hematology for further evaluation management.  On exam today [name] reports that he initially presented emergency department with shortness of breath and no clear etiology was found.  He was sent to cardiology a CT of the chest was ordered which subsequently showed his pulmonary emboli.  He reports he has tolerated Xarelto therapy well with no bleeding, bruising, or dark stools.  He is not having any financial difficulties with the medication.  He reports that he has chronic swelling of his left lower extremity but no pain.  His left lower extremity has always swollen since he injured it 30 years ago and "tore it up in high school football".  He notes that he is not having any other signs or symptoms concerning for recurrent VTE.  On further discussion there is no clear provoking etiology for his current blood clot.  He notes that he has not had any recent surgeries, travel, or illness prior to developing the blood clot.  He is a never smoker and drinks alcohol about once every 2 months or so.  He has a family history markable for hypertension his mother but no history of VTE's or blood clots.  Otherwise he currently denies any fevers, chills, sweats, nausea, vomiting or diarrhea.  A full 10 point ROS is listed below.  MEDICAL HISTORY:  Past Medical History:  Diagnosis Date   History of cocaine abuse (HCC)    Hypertension    Osteoarthritis     SURGICAL HISTORY: Past Surgical History:  Procedure Laterality Date   KNEE ARTHROPLASTY Left 06/21/11   Homero FellersFrank  Aluisio, MD    SOCIAL HISTORY: Social History   Socioeconomic History   Marital status: Single    Spouse name: Not on file   Number of children: 0    Years of education: [employer]   Highest education level: Not on file  Occupational History   Occupation: Scientist, forensic: UNEMPLOYED    Comment: since 2009   Occupation: retired  Tobacco Use   Smoking status: Never   Smokeless tobacco: Never  Vaping Use   Vaping Use: Never used  Substance and Sexual Activity   Alcohol use: Not Currently    Comment: beer a week maybe   Drug use: Not Currently    Comment: Last in Nov 2013   Sexual activity: Not Currently  Other Topics Concern   Not on file  Social History Narrative   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 122 yo)   Social Determinants of Corporate investment banker Strain: Low Risk    Difficulty of Paying Living Expenses: Not hard at all  Food Insecurity: No Food Insecurity   Worried About Programme researcher, broadcasting/film/video in the Last Year: Never true   Barista in the Last Year: Never true  Transportation Needs: No Transportation Needs   Lack of Transportation (Medical): No   Lack of Transportation (Non-Medical): No  Physical Activity: Sufficiently Active   Days of Exercise per Week: 5 days   Minutes of Exercise per Session: 120 min  Stress: No Stress Concern Present   Feeling of Stress : Not at all  Social Connections: Not on file  Intimate Partner Violence: Not on file    FAMILY HISTORY: Family History  Problem Relation Age of Onset   Stroke Mother 3   Hypertension Mother    Healthy Father     ALLERGIES:  has No Known Allergies.  MEDICATIONS:  Current Outpatient Medications  Medication Sig Dispense Refill   gabapentin (NEURONTIN) 300 MG capsule TAKE 1 CAPSULE BY MOUTH EVERYDAY AT BEDTIME 90 capsule 1   magnesium gluconate (MAGONATE) 500 MG tablet Take 500 mg by mouth daily.     telmisartan (MICARDIS) 20 MG tablet TAKE 1 TABLET BY MOUTH EVERY DAY 90 tablet 1   VITAMIN D, CHOLECALCIFEROL, PO Take by mouth. 2 times per day     XARELTO 20 MG TABS tablet TAKE 1 TABLET (20 MG TOTAL) BY MOUTH DAILY WITH SUPPER.  START TAKING AFTER STARTER PACK. 90 tablet 1   No current facility-administered medications for this visit.    REVIEW OF SYSTEMS:   Constitutional: ( - ) fevers, ( - )  chills , ( - ) night sweats Eyes: ( - ) blurriness of vision, ( - ) double vision, ( - ) watery eyes Ears, nose, mouth, throat, and face: ( - ) mucositis, ( - ) sore throat Respiratory: ( - ) cough, ( - ) dyspnea, ( - ) wheezes Cardiovascular: ( - ) palpitation, ( - ) chest discomfort, ( - ) lower extremity swelling Gastrointestinal:  ( - ) nausea, ( - ) heartburn, ( - ) change in bowel habits Skin: ( - ) abnormal skin rashes Lymphatics: ( - ) new lymphadenopathy, ( - ) easy bruising Neurological: ( - ) numbness, ( - ) tingling, ( - ) new weaknesses Behavioral/Psych: ( - ) mood change, ( - ) new changes  All other systems were reviewed with the patient and are negative.  PHYSICAL EXAMINATION:  Vitals:   01/26/22 1353  BP: Marland Kitchen)  148/98  Pulse: 75  Resp: 17  Temp: 97.7 F (36.5 C)  SpO2: 100%   Filed Weights   01/26/22 1353  Weight: 230 lb 1 oz (104.4 kg)    GENERAL: well appearing elderly African-American male in NAD  SKIN: skin color, texture, turgor are normal, no rashes or significant lesions EYES: conjunctiva are pink and non-injected, sclera clear LUNGS: clear to auscultation and percussion with normal breathing effort HEART: regular rate & rhythm and no murmurs and no lower extremity edema Musculoskeletal: no cyanosis of digits and no clubbing  PSYCH: alert & oriented x 3, fluent speech NEURO: no focal motor/sensory deficits  LABORATORY DATA:  I have reviewed the data as listed CBC Latest Ref Rng & Units 12/26/2021 06/12/2021 06/09/2020  WBC 3.4 - 10.8 x10E3/uL 5.4 5.2 5.6  Hemoglobin 13.0 - 17.7 g/dL 55.9 74.1 63.8  Hematocrit 37.5 - 51.0 % 45.3 50.0 45.2  Platelets 150 - 450 x10E3/uL 183 169 171    CMP Latest Ref Rng & Units 12/26/2021 06/22/2021 06/12/2021  Glucose 70 - 99 mg/dL 45(X) 75 91  BUN 8  - 27 mg/dL 16 13 12   Creatinine 0.76 - 1.27 mg/dL 6.46 8.03  Sodium 134 - 144 mmol/L 137 139 137  Potassium 3.5 - 5.2 mmol/L 4.7 5.0 4.1  Chloride 96 - 106 mmol/L 101 104 105  CO2 20 - 29 mmol/L 24 23 25   Calcium 8.6 - 10.2 mg/dL 9.4 9.5 9.5  Total Protein 6.0 - 8.5 g/dL 7.7 7.4 -  Total Bilirubin 0.0 - 1.2 mg/dL 0.5 0.6 -  Alkaline Phos 44 - 121 IU/L 81 70 -  AST 0 - 40 IU/L 35 26 -  ALT 0 - 44 IU/L 29 17 -     ASSESSMENT & PLAN 2.12 71 y.o. male with medical history significant for history of cocaine abuse, hypertension, and osteoarthritis who presents for evaluation of pulmonary emboli/lower extremity DVT.  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  A provoked venous thromboembolism (VTE) is one that has a clear inciting factor or event. Provoking factors include prolonged travel/immobility, surgery (particular abdominal or orthropedic), trauma,  and pregnancy/ estrogen containing birth control. After a detailed history and review of the records there is no clear provoking factor for this patients VTE.  Patients with unprovoked VTEs have up to 25% recurrence after 5 years and 36% at 10 years, with 4% of these clots being fatal (BMJ?2019;366:l4363). Therefore the formal recommendation for unprovoked VTEs is lifelong anticoagulation, as the cause may not be transient or reversible. We recommend 6 months or full strength anticoagulation with a re-evaluation after that time.  The patients will then have a choice of maintenance dose DOAC (preferred, recommended), 81mg  ASA PO daily (non-preferred), or no further anticoagulation (not recommended).    # Unprovoked DVT/Pulmonary Embolism  --findings at this time are consistent with a unprovoked VTE  --will order baseline CMP and CBC to assure labs are adequate for DOAC therapy  --rule out APS  with anticardiolipin and anti beta2 glycoprotein antibodies.  Lupus anticoagulant panel would be altered by presence of blood thinner, will hold on this testing.   --recommend the patient continue xarelto 20mg  PO daily.  Given the presence of his chronic thrombus in the recannulization I would recommend continuation of full-strength Xarelto and would not recommend de-escalation to maintenance therapy. --patient denies any bleeding, bruising,  or dark stools on this medication. It is well tolerated. No difficulties accessing/affording the medication  --RTC in 6 months time with strict return precautions for overt signs of bleeding.    Orders Placed This Encounter  Procedures   CBC with Differential (Cancer Center Only)    Standing Status:   Future    Standing Expiration Date:   01/27/2023   CMP (Cancer Center only)    Standing Status:   Future    Standing Expiration Date:   01/27/2023   Cardiolipin antibodies, IgG, IgM, IgA*    Standing Status:   Future    Standing Expiration Date:   01/26/2023   Beta-2-glycoprotein i abs, IgG/M/A    Standing Status:   Future    Standing Expiration Date:   01/26/2023    All questions were answered. The patient knows to call the clinic with any problems, questions or concerns.  A total of more than 60 minutes were spent on this encounter with face-to-face time and non-face-to-face time, including preparing to see the patient, ordering tests and/or medications, counseling the patient and coordination of care as outlined above.   Ulysees Barns, MD Department of Hematology/Oncology [facility] Phone: [phone] Pager: [phone] Email: [email or web address]  01/26/2022 2:39 PM

## 2022-01-27 ENCOUNTER — Telehealth: Payer: Self-pay | Admitting: Hematology and Oncology

## 2022-01-27 ENCOUNTER — Encounter: Payer: Self-pay | Admitting: Hematology and Oncology

## 2022-01-27 LAB — CARDIOLIPIN ANTIBODIES, IGG, IGM, IGA
Anticardiolipin IgA: <9 APL U/mL (ref 0–11)
Anticardiolipin IgG: <9 GPL U/mL (ref 0–14)
Anticardiolipin IgM: <9 MPL U/mL (ref 0–12)

## 2022-01-27 LAB — BETA-2-GLYCOPROTEIN I ABS, IGG/M/A
Beta-2 Glyco I IgG: <9 GPI IgG units (ref 0–20)
Beta-2-Glycoprotein I IgA: <9 GPI IgA units (ref 0–25)
Beta-2-Glycoprotein I IgM: <9 GPI IgM units (ref 0–32)

## 2022-01-27 NOTE — Telephone Encounter (Signed)
Scheduled per 3/2 los, message has been left with pt ?

## 2022-05-24 ENCOUNTER — Other Ambulatory Visit: Payer: Self-pay | Admitting: Internal Medicine

## 2022-05-26 ENCOUNTER — Ambulatory Visit: Payer: PPO | Admitting: Cardiovascular Disease

## 2022-05-26 ENCOUNTER — Encounter: Payer: Self-pay | Admitting: Cardiovascular Disease

## 2022-05-26 ENCOUNTER — Ambulatory Visit (INDEPENDENT_AMBULATORY_CARE_PROVIDER_SITE_OTHER)
Admission: RE | Admit: 2022-05-26 | Discharge: 2022-05-26 | Disposition: A | Discharge status: Home / Self Care | Payer: PPO | Source: Ambulatory Visit | Attending: Cardiovascular Disease | Admitting: Cardiovascular Disease

## 2022-05-26 ENCOUNTER — Telehealth: Payer: Self-pay | Admitting: Cardiovascular Disease

## 2022-05-26 VITALS — BP 122/82 | HR 75 | Ht 75.0 in | Wt 231.0 lb

## 2022-05-26 DIAGNOSIS — I10 Essential (primary) hypertension: Secondary | ICD-10-CM | POA: Diagnosis not present

## 2022-05-26 DIAGNOSIS — R0602 Shortness of breath: Secondary | ICD-10-CM

## 2022-05-26 DIAGNOSIS — I2699 Other pulmonary embolism without acute cor pulmonale: Secondary | ICD-10-CM

## 2022-05-26 DIAGNOSIS — R072 Precordial pain: Secondary | ICD-10-CM

## 2022-05-26 DIAGNOSIS — R5383 Other fatigue: Secondary | ICD-10-CM | POA: Diagnosis not present

## 2022-05-26 DIAGNOSIS — R079 Chest pain, unspecified: Secondary | ICD-10-CM | POA: Diagnosis not present

## 2022-05-26 LAB — D-DIMER, QUANTITATIVE: D-DIMER: 0.38 mg/L FEU (ref 0.00–0.49)

## 2022-05-26 MED ORDER — IOHEXOL 350 MG/ML SOLN
80.0000 mL | Freq: Once | INTRAVENOUS | Status: AC | PRN
  Administered 2022-05-26: 80 mL via INTRAVENOUS

## 2022-05-26 NOTE — Telephone Encounter (Signed)
Patient is returning phone call back to [facility]

## 2022-05-26 NOTE — Progress Notes (Signed)
CARDIOLOGY CONSULT NOTE       Patient ID: Simuel Stebner MRN: [id number] DOB/AGE: January 24, 1951 71 y.o.  Admit date: (Not on file) Referring Physician: Jeraldine Loots Primary Physician: Dorothyann Peng, MD Primary Cardiologist: Eden Emms Reason for Consultation: Chest Pain  Active Problems:   * No active hospital problems. *   HPI:  71 y.o. first seen 06/14/21 for chest pain.     71 y.o. referred by Dr Jeraldine Loots for chest pain. History of cocaine use and HTN. Seen in ED 7/17  W/U included CXR NAD negative troponin x 2 normal CBC/BMET reviewed  ECG SR PR 224 msec otherwise normal  Went to [employer] football Had a brother who played for [employer] Retired from Patent examiner trading Denies drug, stimulant, testosterone use   Coronary CTA CAD RADS 2 24-49% circ dx calcium score 96.9 64 th percentile but also noted moderate volume PE with esophageal dysmotility Started on xarelto LE venous duplex  07/01/21 with LLE popliteal thrombus improved but still present on repeat duplex 01/13/22 TTE done 06/27/21 with normal RV/LV function no effusion or valve dx  Last month complaining of recurrent atypical pain in his chest not pleuritic concerned about recurrence  ROS All other systems reviewed and negative except as noted above  Past Medical History:  Diagnosis Date   History of cocaine abuse (HCC)    Hypertension    Osteoarthritis     Family History  Problem Relation Age of Onset   Stroke Mother 58   Hypertension Mother    Healthy Father     Social History   Socioeconomic History   Marital status: Single    Spouse name: Not on file   Number of children: 0   Years of education: [employer]   Highest education level: Not on file  Occupational History   Occupation: Scientist, forensic: UNEMPLOYED    Comment: since 2009   Occupation: retired  Tobacco Use   Smoking status: Never   Smokeless tobacco: Never  Vaping Use   Vaping Use: Never used  Substance and Sexual Activity    Alcohol use: Not Currently    Comment: beer a week maybe   Drug use: Not Currently    Comment: Last in Nov 2013   Sexual activity: Not Currently  Other Topics Concern   Not on file  Social History Narrative   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 396 yo)   Social Determinants of Health   Financial Resource Strain: Low Risk  (07/07/2021)   Overall Financial Resource Strain (CARDIA)    Difficulty of Paying Living Expenses: Not hard at all  Food Insecurity: No Food Insecurity (07/07/2021)   Hunger Vital Sign    Worried About Running Out of Food in the Last Year: Never true    Ran Out of Food in the Last Year: Never true  Transportation Needs: No Transportation Needs (07/07/2021)   PRAPARE - Administrator, Civil Service (Medical): No    Lack of Transportation (Non-Medical): No  Physical Activity: Sufficiently Active (07/07/2021)   Exercise Vital Sign    Days of Exercise per Week: 5 days    Minutes of Exercise per Session: 120 min  Stress: No Stress Concern Present (07/07/2021)   [employer] of Occupational Health - Occupational Stress Questionnaire    Feeling of Stress : Not at all  Social Connections: Not on file  Intimate Partner Violence: Not At Risk (06/04/2019)   Humiliation, Afraid, Rape, and Kick questionnaire    Fear of  Current or Ex-Partner: No    Emotionally Abused: No    Physically Abused: No    Sexually Abused: No    Past Surgical History:  Procedure Laterality Date   KNEE ARTHROPLASTY Left 06/21/11   Ollen Gross, MD      Current Outpatient Medications:    gabapentin (NEURONTIN) 300 MG capsule, TAKE 1 CAPSULE BY MOUTH EVERYDAY AT BEDTIME, Disp: 90 capsule, Rfl: 1   magnesium gluconate (MAGONATE) 500 MG tablet, Take 500 mg by mouth daily., Disp: , Rfl:    telmisartan (MICARDIS) 20 MG tablet, TAKE 1 TABLET BY MOUTH EVERY DAY, Disp: 90 tablet, Rfl: 1   VITAMIN D, CHOLECALCIFEROL, PO, Take by mouth. 2 times per day, Disp: , Rfl:    XARELTO 20 MG TABS  tablet, TAKE 1 TABLET (20 MG TOTAL) BY MOUTH DAILY WITH SUPPER. START TAKING AFTER STARTER PACK., Disp: 90 tablet, Rfl: 1    Physical Exam: There were no vitals taken for this visit.   Affect appropriate Healthy:  appears stated age HEENT: normal Neck supple with no adenopathy JVP normal no bruits no thyromegaly Lungs clear with no wheezing and good diaphragmatic motion Heart:  S1/S2 no murmur, no rub, gallop or click PMI normal Abdomen: benighn, BS positve, no tenderness, no AAA no bruit.  No HSM or HJR Distal pulses intact with no bruits No edema Neuro non-focal Skin warm and dry Post right knee surgery    Labs:   Lab Results  Component Value Date   WBC 7.3 01/26/2022   HGB 14.4 01/26/2022   HCT 44.8 01/26/2022   MCV 86.5 01/26/2022   PLT 216 01/26/2022    No results for input(s): "NA", "K", "CL", "CO2", "BUN", "CREATININE", "CALCIUM", "PROT", "BILITOT", "ALKPHOS", "ALT", "AST", "GLUCOSE" in the last 168 hours.  Invalid input(s): "LABALBU"  Lab Results  Component Value Date   TROPONINI <0.30 05/30/2013    Lab Results  Component Value Date   CHOL 239 (H) 12/26/2021   CHOL 222 (H) 06/22/2021   CHOL 206 (H) 06/09/2020   Lab Results  Component Value Date   HDL 74 12/26/2021   HDL 59 06/22/2021   HDL 62 06/09/2020   Lab Results  Component Value Date   LDLCALC 149 (H) 12/26/2021   LDLCALC 146 (H) 06/22/2021   LDLCALC 130 (H) 06/09/2020   Lab Results  Component Value Date   TRIG 95 12/26/2021   TRIG 94 06/22/2021   TRIG 77 06/09/2020   Lab Results  Component Value Date   CHOLHDL 3.8 06/22/2021   CHOLHDL 3.3 06/09/2020   CHOLHDL 3.6 06/12/2019   No results found for: "LDLDIRECT"    Radiology: No results found.  EKG: NSR normal PR 224 msec   ASSESSMENT AND PLAN:   Chest Pain: no obstructive CAD on cardiac CTA  HTN:  continue ARB well controlled  PE:  on Xarelto with documented LLE PE f/u primary/hematology in regard to duration but appears  to be unprovoked and chronic presence of thrombus in left popliteal vein  HLD: LDL 149 discussed starting statin will f/u with primary   F/U PRN    Signed: Charlton Haws 05/26/2022, 8:04 AM

## 2022-05-26 NOTE — Telephone Encounter (Signed)
Called patient back with CT results.

## 2022-05-26 NOTE — Patient Instructions (Signed)
Medication Instructions:  Your physician recommends that you continue on your current medications as directed. Please refer to the Current Medication list given to you today.  *If you need a refill on your cardiac medications before your next appointment, please call your pharmacy*  Lab Work: Your physician recommends that you have lab work today- BMET and d-dimer.  If you have labs (blood work) drawn today and your tests are completely normal, you will receive your results only by: MyChart Message (if you have MyChart) OR A paper copy in the mail If you have any lab test that is abnormal or we need to change your treatment, we will call you to review the results.  Testing/Procedures: Non-Cardiac CT scanning, (CAT scanning), is a noninvasive, special x-ray that produces cross-sectional images of the body using x-rays and a computer. CT scans help physicians diagnose and treat medical conditions. For some CT exams, a contrast material is used to enhance visibility in the area of the body being studied. CT scans provide greater clarity and reveal more details than regular x-ray exams.  Follow-Up: At [facility], you and your health needs are our priority.  As part of our continuing mission to provide you with exceptional heart care, we have created designated Provider Care Teams.  These Care Teams include your primary Cardiologist (physician) and Advanced Practice Providers (APPs -  Physician Assistants and Nurse Practitioners) who all work together to provide you with the care you need, when you need it.  We recommend signing up for the patient portal called "MyChart".  Sign up information is provided on this After Visit Summary.  MyChart is used to connect with patients for Virtual Visits (Telemedicine).  Patients are able to view lab/test results, encounter notes, upcoming appointments, etc.  Non-urgent messages can be sent to your provider as well.   To learn more about what you can do with  MyChart, go to [email or web address].    Your next appointment:   1 year(s)  The format for your next appointment:   In Person  Provider:   Charlton Haws, MD {   Important Information About Sugar

## 2022-05-27 LAB — BASIC METABOLIC PANEL
BUN/Creatinine Ratio: 16 (ref 10–24)
BUN: 17 mg/dL (ref 8–27)
CO2: 24 mmol/L (ref 20–29)
Calcium: 9.6 mg/dL (ref 8.6–10.2)
Chloride: 102 mmol/L (ref 96–106)
Creatinine, Ser: 1.04 mg/dL (ref 0.76–1.27)
Glucose: 64 mg/dL — ABNORMAL LOW (ref 70–99)
Potassium: 4.7 mmol/L (ref 3.5–5.2)
Sodium: 138 mmol/L (ref 134–144)
eGFR: 77 mL/min/{1.73_m2} (ref >59)

## 2022-06-13 DIAGNOSIS — R972 Elevated prostate specific antigen [PSA]: Secondary | ICD-10-CM | POA: Diagnosis not present

## 2022-06-22 DIAGNOSIS — Z96652 Presence of left artificial knee joint: Secondary | ICD-10-CM | POA: Diagnosis not present

## 2022-06-27 ENCOUNTER — Encounter: Payer: Self-pay | Admitting: Internal Medicine

## 2022-06-27 ENCOUNTER — Ambulatory Visit (INDEPENDENT_AMBULATORY_CARE_PROVIDER_SITE_OTHER): Payer: PPO | Admitting: Internal Medicine

## 2022-06-27 VITALS — BP 120/70 | HR 67 | Temp 97.7°F | Ht 75.0 in | Wt 232.0 lb

## 2022-06-27 DIAGNOSIS — I700 Atherosclerosis of aorta: Secondary | ICD-10-CM

## 2022-06-27 DIAGNOSIS — R7309 Other abnormal glucose: Secondary | ICD-10-CM | POA: Diagnosis not present

## 2022-06-27 DIAGNOSIS — Z6829 Body mass index (BMI) 29.0-29.9, adult: Secondary | ICD-10-CM

## 2022-06-27 DIAGNOSIS — D6869 Other thrombophilia: Secondary | ICD-10-CM

## 2022-06-27 DIAGNOSIS — I10 Essential (primary) hypertension: Secondary | ICD-10-CM

## 2022-06-27 DIAGNOSIS — I119 Hypertensive heart disease without heart failure: Secondary | ICD-10-CM | POA: Diagnosis not present

## 2022-06-27 DIAGNOSIS — Z0000 Encounter for general adult medical examination without abnormal findings: Secondary | ICD-10-CM | POA: Diagnosis not present

## 2022-06-27 DIAGNOSIS — I25118 Atherosclerotic heart disease of native coronary artery with other forms of angina pectoris: Secondary | ICD-10-CM | POA: Diagnosis not present

## 2022-06-27 LAB — POCT URINALYSIS DIPSTICK
Bilirubin, UA: NEGATIVE
Glucose, UA: NEGATIVE
Ketones, UA: NEGATIVE
Leukocytes, UA: NEGATIVE
Nitrite, UA: NEGATIVE
Protein, UA: NEGATIVE
Spec Grav, UA: 1.01 (ref 1.010–1.025)
Urobilinogen, UA: 0.2 E.U./dL
pH, UA: 7 (ref 5.0–8.0)

## 2022-06-27 NOTE — Patient Instructions (Signed)
Health Maintenance, Male Adopting a healthy lifestyle and getting preventive care are important in promoting health and wellness. Ask your health care provider about: The right schedule for you to have regular tests and exams. Things you can do on your own to prevent diseases and keep yourself healthy. What should I know about diet, weight, and exercise? Eat a healthy diet  Eat a diet that includes plenty of vegetables, fruits, low-fat dairy products, and lean protein. Do not eat a lot of foods that are high in solid fats, added sugars, or sodium. Maintain a healthy weight Body mass index (BMI) is a measurement that can be used to identify possible weight problems. It estimates body fat based on height and weight. Your health care provider can help determine your BMI and help you achieve or maintain a healthy weight. Get regular exercise Get regular exercise. This is one of the most important things you can do for your health. Most adults should: Exercise for at least 150 minutes each week. The exercise should increase your heart rate and make you sweat (moderate-intensity exercise). Do strengthening exercises at least twice a week. This is in addition to the moderate-intensity exercise. Spend less time sitting. Even light physical activity can be beneficial. Watch cholesterol and blood lipids Have your blood tested for lipids and cholesterol at 71 years of age, then have this test every 5 years. You may need to have your cholesterol levels checked more often if: Your lipid or cholesterol levels are high. You are older than 71 years of age. You are at high risk for heart disease. What should I know about cancer screening? Many types of cancers can be detected early and may often be prevented. Depending on your health history and family history, you may need to have cancer screening at various ages. This may include screening for: Colorectal cancer. Prostate cancer. Skin cancer. Lung  cancer. What should I know about heart disease, diabetes, and high blood pressure? Blood pressure and heart disease High blood pressure causes heart disease and increases the risk of stroke. This is more likely to develop in people who have high blood pressure readings or are overweight. Talk with your health care provider about your target blood pressure readings. Have your blood pressure checked: Every 3-5 years if you are 18-39 years of age. Every year if you are 40 years old or older. If you are between the ages of 65 and 75 and are a current or former smoker, ask your health care provider if you should have a one-time screening for abdominal aortic aneurysm (AAA). Diabetes Have regular diabetes screenings. This checks your fasting blood sugar level. Have the screening done: Once every three years after age 45 if you are at a normal weight and have a low risk for diabetes. More often and at a younger age if you are overweight or have a high risk for diabetes. What should I know about preventing infection? Hepatitis B If you have a higher risk for hepatitis B, you should be screened for this virus. Talk with your health care provider to find out if you are at risk for hepatitis B infection. Hepatitis C Blood testing is recommended for: Everyone born from 1945 through 1965. Anyone with known risk factors for hepatitis C. Sexually transmitted infections (STIs) You should be screened each year for STIs, including gonorrhea and chlamydia, if: You are sexually active and are younger than 71 years of age. You are older than 71 years of age and your   health care provider tells you that you are at risk for this type of infection. Your sexual activity has changed since you were last screened, and you are at increased risk for chlamydia or gonorrhea. Ask your health care provider if you are at risk. Ask your health care provider about whether you are at high risk for HIV. Your health care provider  may recommend a prescription medicine to help prevent HIV infection. If you choose to take medicine to prevent HIV, you should first get tested for HIV. You should then be tested every 3 months for as long as you are taking the medicine. Follow these instructions at home: Alcohol use Do not drink alcohol if your health care provider tells you not to drink. If you drink alcohol: Limit how much you have to 0-2 drinks a day. Know how much alcohol is in your drink. In the [place] one drink equals one 12 oz bottle of beer (355 mL), one 5 oz glass of wine (148 mL), or one 1 oz glass of hard liquor (44 mL). Lifestyle Do not use any products that contain nicotine or tobacco. These products include cigarettes, chewing tobacco, and vaping devices, such as e-cigarettes. If you need help quitting, ask your health care provider. Do not use street drugs. Do not share needles. Ask your health care provider for help if you need support or information about quitting drugs. General instructions Schedule regular health, dental, and eye exams. Stay current with your vaccines. Tell your health care provider if: You often feel depressed. You have ever been abused or do not feel safe at home. Summary Adopting a healthy lifestyle and getting preventive care are important in promoting health and wellness. Follow your health care provider's instructions about healthy diet, exercising, and getting tested or screened for diseases. Follow your health care provider's instructions on monitoring your cholesterol and blood pressure. This information is not intended to replace advice given to you by your health care provider. Make sure you discuss any questions you have with your health care provider. Document Revised: 04/04/2021 Document Reviewed: 04/04/2021 Elsevier Patient Education  2023 [employer] Inc.  

## 2022-06-27 NOTE — Progress Notes (Signed)
Marc [name],acting as a Education administrator for Marc Greenland, MD.,have documented all relevant documentation on the behalf of Marc Greenland, MD,as directed by  Marc Greenland, MD while in the presence of Marc Greenland, MD.   Subjective:     Patient ID: Marc [name] , male    DOB: 04-03-1951 , 71 y.o.   MRN: [id number]   Chief Complaint  Patient presents with   Annual Exam   Hypertension    HPI  He presents today for a full physical exam.  He reports compliance with meds. He denies headaches, chest pain and shortness of breath.  Patient would like to discuss his labs from Dr.Nishan's office.  Hypertension This is a chronic problem. The current episode started more than 1 year ago. The problem has been gradually improving since onset. The problem is controlled. Pertinent negatives include no blurred vision. Past treatments include angiotensin blockers. The current treatment provides moderate improvement. Compliance problems include diet.      Past Medical History:  Diagnosis Date   History of cocaine abuse ([place])    Hypertension    Osteoarthritis      Family History  Problem Relation Age of Onset   Stroke Mother 74   Hypertension Mother    Healthy Father      Current Outpatient Medications:    gabapentin (NEURONTIN) 300 MG capsule, TAKE 1 CAPSULE BY MOUTH EVERYDAY AT BEDTIME, Disp: 90 capsule, Rfl: 1   magnesium gluconate (MAGONATE) 500 MG tablet, Take 500 mg by mouth daily., Disp: , Rfl:    telmisartan (MICARDIS) 20 MG tablet, TAKE 1 TABLET BY MOUTH EVERY DAY, Disp: 90 tablet, Rfl: 1   VITAMIN D, CHOLECALCIFEROL, PO, Take by mouth. 2 times per day, Disp: , Rfl:    XARELTO 20 MG TABS tablet, TAKE 1 TABLET (20 MG TOTAL) BY MOUTH DAILY WITH SUPPER. START TAKING AFTER STARTER PACK., Disp: 90 tablet, Rfl: 1   No Known Allergies   Men's preventive visit. Patient Health Questionnaire (PHQ-2) is  Flowsheet Row Clinical Support from 07/07/2021 in [facility]  PHQ-2 Total Score 0     . Patient is on a heart healthy diet. Marital status: Single. Relevant history for alcohol use is:  Social History   Substance and Sexual Activity  Alcohol Use Not Currently   Comment: beer a week maybe  . Relevant history for tobacco use is:  Social History   Tobacco Use  Smoking Status Never  Smokeless Tobacco Never  .   Review of Systems  Constitutional: Negative.   HENT: Negative.    Eyes: Negative.  Negative for blurred vision.  Respiratory: Negative.    Cardiovascular: Negative.   Gastrointestinal: Negative.   Endocrine: Negative.   Genitourinary: Negative.   Musculoskeletal: Negative.   Skin: Negative.   Allergic/Immunologic: Negative.   Neurological: Negative.   Hematological: Negative.   Psychiatric/Behavioral: Negative.       Today's Vitals   06/27/22 1436  BP: 120/70  Pulse: 67  Temp: 97.7 F (36.5 C)  Weight: 232 lb (105.2 kg)  Height: 6' 3"  (1.905 m)  PainSc: 0-No pain   Body mass index is 29 kg/m.  Wt Readings from Last 3 Encounters:  06/27/22 232 lb (105.2 kg)  05/26/22 231 lb (104.8 kg)  01/26/22 230 lb 1 oz (104.4 kg)     Objective:  Physical Exam Vitals and nursing note reviewed.  Constitutional:      Appearance: Normal appearance.  HENT:  Head: Normocephalic and atraumatic.     Right Ear: Tympanic membrane, ear canal and external ear normal.     Left Ear: Tympanic membrane, ear canal and external ear normal.     Nose: Nose normal.     Mouth/Throat:     Mouth: Mucous membranes are moist.     Pharynx: Oropharynx is clear.  Eyes:     Extraocular Movements: Extraocular movements intact.     Conjunctiva/sclera: Conjunctivae normal.     Pupils: Pupils are equal, round, and reactive to light.  Cardiovascular:     Rate and Rhythm: Normal rate and regular rhythm.     Pulses: Normal pulses.     Heart sounds: Normal heart sounds.  Pulmonary:     Effort: Pulmonary effort is normal.     Breath  sounds: Normal breath sounds.  Chest:  Breasts:    Right: Normal. No swelling, bleeding, inverted nipple, mass or nipple discharge.     Left: Normal. No swelling, bleeding, inverted nipple, mass or nipple discharge.  Abdominal:     General: Abdomen is flat. Bowel sounds are normal.     Palpations: Abdomen is soft.  Genitourinary:    Comments: Declined  Musculoskeletal:        General: Normal range of motion.     Cervical back: Normal range of motion and neck supple.  Skin:    General: Skin is warm.  Neurological:     General: No focal deficit present.     Mental Status: He is alert.  Psychiatric:        Mood and Affect: Mood normal.        Behavior: Behavior normal.         Assessment And Plan:    1. Encounter for general adult medical examination w/o abnormal findings Comments: A full exam was performed. DRE deferred, as per patient request. PATIENT IS ADVISED TO GET 30-45 MINUTES REGULAR EXERCISE NO LESS THAN FOUR TO FIVE DAYS PER WEEK - BOTH WEIGHTBEARING EXERCISES AND AEROBIC ARE RECOMMENDED.  PATIENT IS ADVISED TO FOLLOW A HEALTHY DIET WITH AT LEAST SIX FRUITS/VEGGIES PER DAY, DECREASE INTAKE OF RED MEAT, AND TO INCREASE FISH INTAKE TO TWO DAYS PER WEEK.  MEATS/FISH SHOULD NOT BE FRIED, BAKED OR BROILED IS PREFERABLE.  IT IS ALSO IMPORTANT TO CUT BACK ON YOUR SUGAR INTAKE. PLEASE AVOID ANYTHING WITH ADDED SUGAR, CORN SYRUP OR OTHER SWEETENERS. IF YOU MUST USE A SWEETENER, YOU CAN TRY STEVIA. IT IS ALSO IMPORTANT TO AVOID ARTIFICIALLY SWEETENERS AND DIET BEVERAGES. LASTLY, I SUGGEST WEARING SPF 50 SUNSCREEN ON EXPOSED PARTS AND ESPECIALLY WHEN IN THE DIRECT SUNLIGHT FOR AN EXTENDED PERIOD OF TIME.  PLEASE AVOID FAST FOOD RESTAURANTS AND INCREASE YOUR WATER INTAKE.  2. Hypertensive heart disease without heart failure Comments: Chronic, well controlled. He will c/w telmisartan 5m daily. He is encouraged to follow a heart healthy lifestyle -clean eating, exercise and stress mgmt. He  will f/u in six months.  - Lipid panel - CBC no Diff - Hemoglobin A1c - CMP14+EGFR - POCT Urinalysis Dipstick (81002)  3. Coronary artery disease of native artery of native heart with stable angina pectoris ([facility] Comments: Coronary CTA reviewed in full detail. He is not on ASA therapy due to use of Xarelto. Not on statin therapy, per his preference despite being aware of risks.  4. Atherosclerosis of aorta ([facility]) Comments: Chronic, LDL goal <70.  He does not wish to start statin therapy at this time. Coronary CTA results d/w pt in  detail. He wishes to think about his options. - Lipid panel  5. Acquired thrombophilia ([place]) Comments: He is currently on Xarelto due to unprovoked PE.   6. BMI 29.0-29.9,adult Comments: He is encouraged to aim for at least 150 minutes of exercise per week, including strength training.  Patient was given opportunity to ask questions. Patient verbalized understanding of the plan and was able to repeat key elements of the plan. All questions were answered to their satisfaction.   I, Marc Greenland, MD, have reviewed all documentation for this visit. The documentation on 06/27/22 for the exam, diagnosis, procedures, and orders are all accurate and complete.   THE PATIENT IS ENCOURAGED TO PRACTICE SOCIAL DISTANCING DUE TO THE COVID-19 PANDEMIC.

## 2022-06-28 LAB — CMP14+EGFR
ALT: 23 IU/L (ref 0–44)
AST: 29 IU/L (ref 0–40)
Albumin/Globulin Ratio: 1.3 (ref 1.2–2.2)
Albumin: 4.2 g/dL (ref 3.8–4.8)
Alkaline Phosphatase: 78 IU/L (ref 44–121)
BUN/Creatinine Ratio: 18 (ref 10–24)
BUN: 18 mg/dL (ref 8–27)
Bilirubin Total: 0.4 mg/dL (ref 0.0–1.2)
CO2: 25 mmol/L (ref 20–29)
Calcium: 9.5 mg/dL (ref 8.6–10.2)
Chloride: 101 mmol/L (ref 96–106)
Creatinine, Ser: 0.99 mg/dL (ref 0.76–1.27)
Globulin, Total: 3.3 g/dL (ref 1.5–4.5)
Glucose: 71 mg/dL (ref 70–99)
Potassium: 4.5 mmol/L (ref 3.5–5.2)
Sodium: 137 mmol/L (ref 134–144)
Total Protein: 7.5 g/dL (ref 6.0–8.5)
eGFR: 81 mL/min/{1.73_m2} (ref >59)

## 2022-06-28 LAB — CBC
Hematocrit: 46 % (ref 37.5–51.0)
Hemoglobin: 15.1 g/dL (ref 13.0–17.7)
MCH: 28.7 pg (ref 26.6–33.0)
MCHC: 32.8 g/dL (ref 31.5–35.7)
MCV: 87 fL (ref 79–97)
Platelets: 183 10*3/uL (ref 150–450)
RBC: 5.27 x10E6/uL (ref 4.14–5.80)
RDW: 12.9 % (ref 11.6–15.4)
WBC: 4.6 10*3/uL (ref 3.4–10.8)

## 2022-06-28 LAB — HEMOGLOBIN A1C
Est. average glucose Bld gHb Est-mCnc: 105 mg/dL
Hgb A1c MFr Bld: 5.3 % (ref 4.8–5.6)

## 2022-07-04 LAB — SPECIMEN STATUS REPORT

## 2022-07-04 LAB — LIPID PANEL
Chol/HDL Ratio: 3.5 ratio (ref 0.0–5.0)
Cholesterol, Total: 211 mg/dL — ABNORMAL HIGH (ref 100–199)
HDL: 61 mg/dL (ref >39)
LDL Chol Calc (NIH): 132 mg/dL — ABNORMAL HIGH (ref 0–99)
Triglycerides: 100 mg/dL (ref 0–149)
VLDL Cholesterol Cal: 18 mg/dL (ref 5–40)

## 2022-07-06 ENCOUNTER — Other Ambulatory Visit: Payer: Self-pay | Admitting: Cardiovascular Disease

## 2022-07-06 DIAGNOSIS — I2699 Other pulmonary embolism without acute cor pulmonale: Secondary | ICD-10-CM

## 2022-07-07 NOTE — Telephone Encounter (Signed)
Prescription refill request for Xarelto received.  Indication: PE Last office visit: 05/26/22 [facility]) Weight: 105.2kg Age: 71 Scr: 0.99 (06/27/22) CrCl: 101.32ml/min  Appropriate dose and refill sent to requested pharmacy.

## 2022-07-22 IMAGING — CT CT HEART MORP W/ CTA COR W/ SCORE W/ CA W/CM &/OR W/O CM
4 of 7 series · 8 of 20 positions shown, 9 images · IV contrast (omnipaque)
Comparison: Chest radiograph 06/12/2021
COMPARISON: Chest radiograph 06/12/2021

Addendum:
EXAM:
OVER-READ INTERPRETATION  CT CHEST

The following report is an over-read performed by radiologist Dr.
Ingri Curl [[email or web address]] on 06/24/2021. This over-read
does not include interpretation of cardiac or coronary anatomy or
pathology. The coronary CTA interpretation by the cardiologist is
attached.
HISTORY: 70 yo male with chest pain, nonspecific
Cardiac/Coronary CTA
TECHNIQUE: The patient was scanned on a Siemens Force scanner.
PROTOCOL: A 100 kV prospective scan was triggered in the descending thoracic
aorta at 111 HU's. Axial non-contrast 3 mm slices were carried out
through the heart. The data set was analyzed on a dedicated work
station and scored using the Agatson method. Gantry rotation speed
was 250 msecs and collimation was .6 mm. Beta blockade and 0.8 mg of
sl NTG was given. The 3D data set was reconstructed in 5% intervals
of the 35-75 % of the R-R cycle. Diastolic phases were analyzed on a
dedicated work station using MPR, MIP and VRT modes. The patient
received 100mL OMNIPAQUE IOHEXOL 350 MG/ML SOLN of contrast.

[Series 6: best diast 67 % · axial · 0.39mm/px · z∈[+1271,+1328]mm · 2 of 432 slices shown]
[im 144/432  vessel]
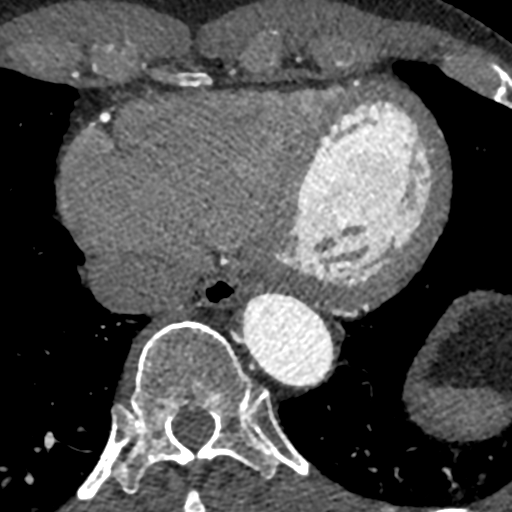
[im 288/432  vessel]
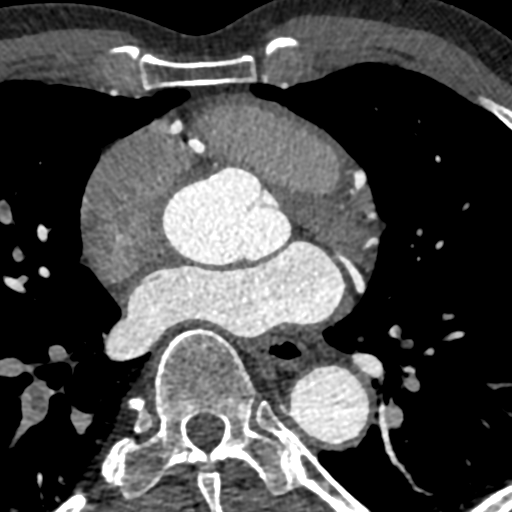

[Series 8: best syst · axial · 0.39mm/px · z∈[+1271,+1328]mm · 2 of 432 slices shown, 3 images]
[im 144/432  vessel]
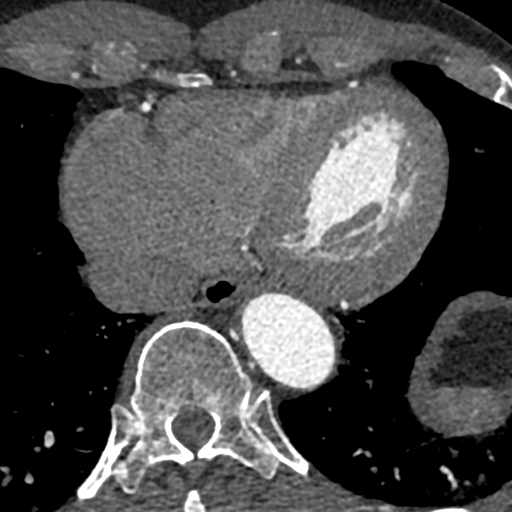
[im 144/432  lung]
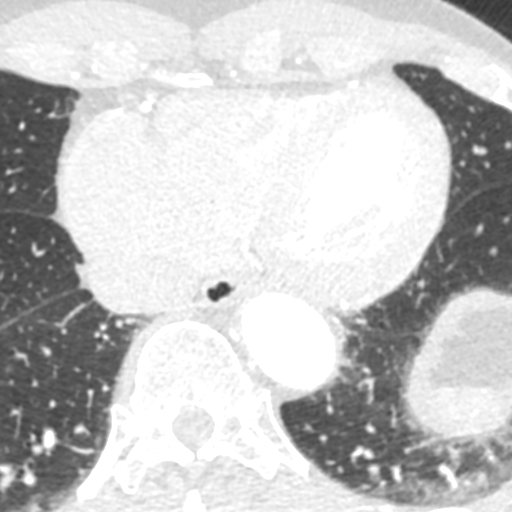
[im 288/432  vessel]
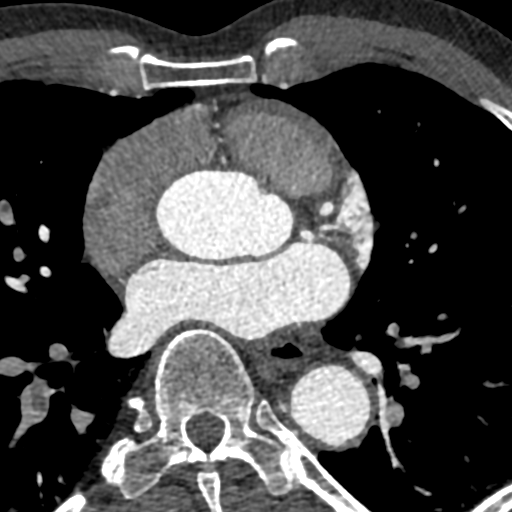

[Series 9: ts syst sharp · axial · 0.39mm/px · z∈[+1271,+1328]mm · 2 of 432 slices shown]
[im 144/432  lung]
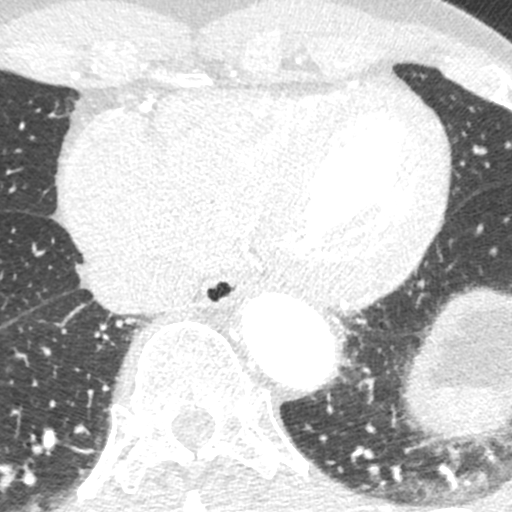
[im 288/432  lung]
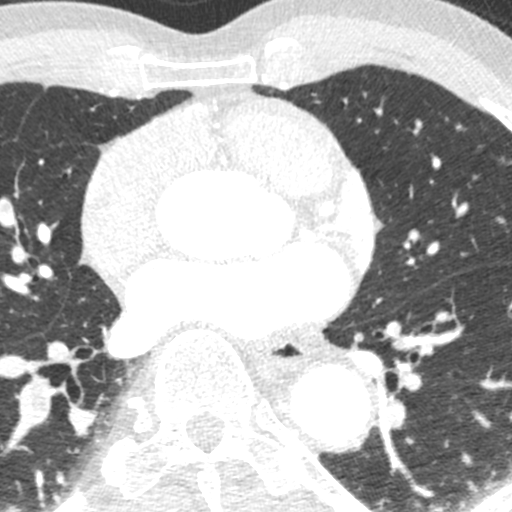

[Series 12: ts diast sharp 67 % · axial · 0.39mm/px · z∈[+1271,+1328]mm · 2 of 432 slices shown]
[im 144/432  lung]
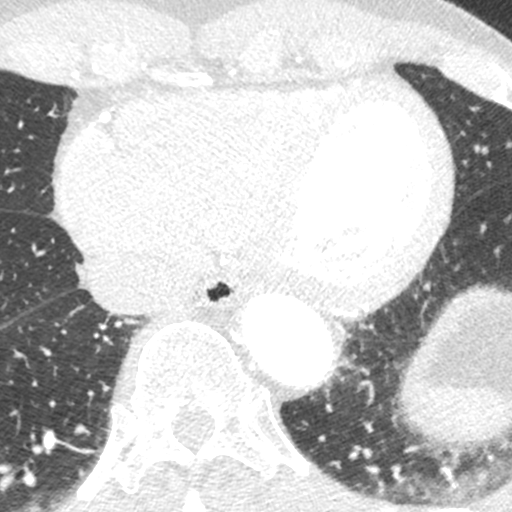
[im 288/432  lung]
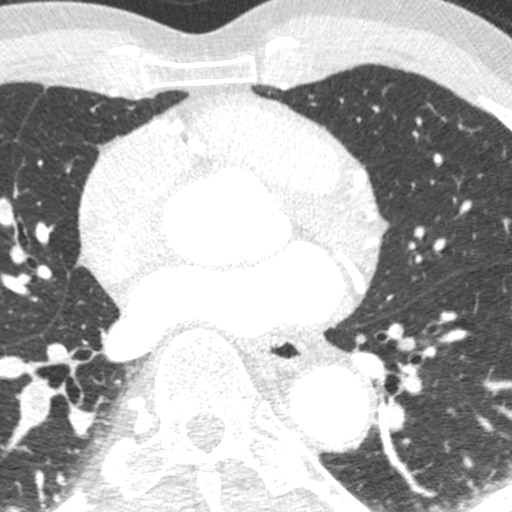

[8 of 20 positions shown; findings below may reference images not displayed]

FINDINGS: Vascular: Aortic atherosclerosis. Borderline ascending aortic
dilatation including up to 3.9 cm on [DATE]. When correlated with
coronal reformats, maximally 3.7 cm on image 46. Tortuous thoracic
aorta. Bilateral pulmonary emboli. Example right lower lobe on [DATE]
in the lobar to segmental branches. Left upper lobe on [DATE] within
a segmental branch. Left lower lobe lobar branch on [DATE].

Mediastinum/Nodes: No imaged thoracic adenopathy. Esophageal fluid
level on [DATE].

Lungs/Pleura: No pleural fluid.  Right base scarring laterally.

Upper Abdomen: Subcentimeter high right hepatic lobe cyst. Normal
imaged portions of the spleen, stomach, pancreas, left adrenal
gland, left kidney.

Musculoskeletal: No acute osseous abnormality.
IMPRESSION: 1. Moderate volume bilateral pulmonary emboli.
2. Upper normal ascending aortic caliber, as detailed above.
3.  Aortic Atherosclerosis (SJQX3-5A6.6).
4. Esophageal air fluid level suggests dysmotility or
gastroesophageal reflux.

These results will be called to the ordering clinician or
representative by the Radiologist Assistant, and communication
documented in the PACS or [REDACTED].
FINDINGS: Quality: Excellent, HR 53

Coronary calcium score: The patient's coronary artery calcium score
is 95.9, which places the patient in the 64th percentile.

Coronary arteries: Normal coronary origins.  Right dominance.

Right Coronary Artery: Dominant.  No disease.

Left Main Coronary Artery: Normal. Bifurcates into the LAD and LCx
arteries.

Left Anterior Descending Coronary Artery: Large anterior artery,
extends around the apex. No disease. Large mid-vessel diagonal
branch without disease.

Left Circumflex Artery: Mild proximal mixed stenosis 24-49%
([id number]). Minimal distal 1-24% mixed stenosis ([id number]).

Aorta: Dilated to 40 mm at the mid ascending aorta (level of the PA
bifurcation) measured double oblique. No calcifications. No
dissection.

Aortic Valve: Trileaflet.  No calcifications.

Other findings:

Normal pulmonary vein drainage into the left atrium.

Normal left atrial appendage without a thrombus.

Normal size of the pulmonary artery.

Filling defects noted in the branch pulmonary arteries bilaterally,
consistent with pulmonary emboli (see radiology overread - provider
notified).
IMPRESSION: 1. Minimal to mild non-obstructive mixed CAD, predominantly of the
LCX, CADRADS = 2.

2. Coronary calcium score of 95.9. This was 64th percentile for age
and sex matched control.

3. Normal coronary origin with right dominance.

4. Filling defects noted in the branch pulmonary arteries
bilaterally, consistent with pulmonary emboli (see radiology
overread - provider notified).

*** End of Addendum ***
EXAM:
OVER-READ INTERPRETATION  CT CHEST

The following report is an over-read performed by radiologist Dr.
Ingri Curl [[email or web address]] on 06/24/2021. This over-read
does not include interpretation of cardiac or coronary anatomy or
pathology. The coronary CTA interpretation by the cardiologist is
attached.
FINDINGS: Vascular: Aortic atherosclerosis. Borderline ascending aortic
dilatation including up to 3.9 cm on [DATE]. When correlated with
coronal reformats, maximally 3.7 cm on image 46. Tortuous thoracic
aorta. Bilateral pulmonary emboli. Example right lower lobe on [DATE]
in the lobar to segmental branches. Left upper lobe on [DATE] within
a segmental branch. Left lower lobe lobar branch on [DATE].

Mediastinum/Nodes: No imaged thoracic adenopathy. Esophageal fluid
level on [DATE].

Lungs/Pleura: No pleural fluid.  Right base scarring laterally.

Upper Abdomen: Subcentimeter high right hepatic lobe cyst. Normal
imaged portions of the spleen, stomach, pancreas, left adrenal
gland, left kidney.

Musculoskeletal: No acute osseous abnormality.
IMPRESSION: 1. Moderate volume bilateral pulmonary emboli.
2. Upper normal ascending aortic caliber, as detailed above.
3.  Aortic Atherosclerosis (SJQX3-5A6.6).
4. Esophageal air fluid level suggests dysmotility or
gastroesophageal reflux.

These results will be called to the ordering clinician or
representative by the Radiologist Assistant, and communication
documented in the PACS or [REDACTED].

## 2022-07-28 ENCOUNTER — Inpatient Hospital Stay (HOSPITAL_BASED_OUTPATIENT_CLINIC_OR_DEPARTMENT_OTHER): Payer: PPO | Admitting: Hematology and Oncology

## 2022-07-28 ENCOUNTER — Inpatient Hospital Stay: Payer: PPO | Attending: Hematology and Oncology

## 2022-07-28 ENCOUNTER — Other Ambulatory Visit: Payer: Self-pay | Admitting: Hematology and Oncology

## 2022-07-28 ENCOUNTER — Other Ambulatory Visit: Payer: Self-pay

## 2022-07-28 VITALS — BP 130/87 | HR 58 | Temp 97.9°F | Resp 16 | Wt 233.6 lb

## 2022-07-28 DIAGNOSIS — Z7901 Long term (current) use of anticoagulants: Secondary | ICD-10-CM | POA: Diagnosis not present

## 2022-07-28 DIAGNOSIS — I2699 Other pulmonary embolism without acute cor pulmonale: Secondary | ICD-10-CM

## 2022-07-28 DIAGNOSIS — Z86718 Personal history of other venous thrombosis and embolism: Secondary | ICD-10-CM | POA: Diagnosis not present

## 2022-07-28 DIAGNOSIS — Z86711 Personal history of pulmonary embolism: Secondary | ICD-10-CM | POA: Insufficient documentation

## 2022-07-28 LAB — CMP (CANCER CENTER ONLY)
ALT: 21 U/L (ref 0–44)
AST: 32 U/L (ref 15–41)
Albumin: 4 g/dL (ref 3.5–5.0)
Alkaline Phosphatase: 60 U/L (ref 38–126)
Anion gap: 3 — ABNORMAL LOW (ref 5–15)
BUN: 21 mg/dL (ref 8–23)
CO2: 28 mmol/L (ref 22–32)
Calcium: 9.5 mg/dL (ref 8.9–10.3)
Chloride: 105 mmol/L (ref 98–111)
Creatinine: 1.01 mg/dL (ref 0.61–1.24)
GFR, Estimated: >60 mL/min (ref >60)
Glucose, Bld: 96 mg/dL (ref 70–99)
Potassium: 4.4 mmol/L (ref 3.5–5.1)
Sodium: 136 mmol/L (ref 135–145)
Total Bilirubin: 0.6 mg/dL (ref 0.3–1.2)
Total Protein: 7.2 g/dL (ref 6.5–8.1)

## 2022-07-28 LAB — CBC WITH DIFFERENTIAL (CANCER CENTER ONLY)
Abs Immature Granulocytes: 0.01 10*3/uL (ref 0.00–0.07)
Basophils Absolute: 0 10*3/uL (ref 0.0–0.1)
Basophils Relative: 0 %
Eosinophils Absolute: 0.3 10*3/uL (ref 0.0–0.5)
Eosinophils Relative: 5 %
HCT: 41 % (ref 39.0–52.0)
Hemoglobin: 14 g/dL (ref 13.0–17.0)
Immature Granulocytes: 0 %
Lymphocytes Relative: 36 %
Lymphs Abs: 1.9 10*3/uL (ref 0.7–4.0)
MCH: 29.1 pg (ref 26.0–34.0)
MCHC: 34.1 g/dL (ref 30.0–36.0)
MCV: 85.2 fL (ref 80.0–100.0)
Monocytes Absolute: 0.4 10*3/uL (ref 0.1–1.0)
Monocytes Relative: 8 %
Neutro Abs: 2.6 10*3/uL (ref 1.7–7.7)
Neutrophils Relative %: 51 %
Platelet Count: 167 10*3/uL (ref 150–400)
RBC: 4.81 MIL/uL (ref 4.22–5.81)
RDW: 13.1 % (ref 11.5–15.5)
WBC Count: 5.2 10*3/uL (ref 4.0–10.5)
nRBC: 0 % (ref 0.0–0.2)

## 2022-07-28 NOTE — Progress Notes (Signed)
[facility] [phone]   [phone]  PROGRESS NOTE  Patient Care Team: Dorothyann Peng, MD as PCP - General (Internal Medicine) Wendall Stade, MD as PCP - Cardiology (Cardiology)  Hematological/Oncological History # Bilateral Pulmonary Emboli # Bilateral Lower Extremity DVTs 06/24/2021: Patient underwent a CT coronary morphology with CTA and was incidentally found to have filling defects in the pulmonary arteries bilaterally consistent with pulmonary emboli.  Patient started on Xarelto therapy. 06/30/2021: Ultrasound of lower extremities bilaterally showed on the right side acute deep vein thrombosis involving the popliteal, right femoral vein as well as on the left side acute deep vein thrombosis involving the left femoral vein, left popliteal vein, and left peroneal veins 01/13/2022: Repeat lower extremity ultrasound showed recannulized thrombus in the left popliteal veins findings consistent with chronic deep vein thrombosis. 01/26/2022: Establish care with Dr. Leonides Schanz.  Interval History:  Marc [name] 71 y.o. male with medical history significant for bilateral pulmonary emboli and DVTs who presents for a follow up visit. The patient's last visit was on 01/26/2022. In the interim since the last visit he has continued on Xarelto therapy.  On exam today Mr. Dollard notes he has been well overall interim since her last visit.  Has been taking Xarelto 20 mg p.o. daily without any bleeding or bruising.  He reports that he has no trouble with the cost of the medication.  He reports that a few months ago he did have some tightness in his leg but has subsequently resolved.  He notes that he does get some occasional cuts and scrapes but never had an issue with bleeding.  Today we discussed maintenance dose Xarelto 10 mg p.o. daily and he was agreeable to decreasing down to a maintenance dose.  He otherwise denies any fevers, chills, sweats, nausea, vomiting or diarrhea.   Full 10 point ROS is listed below.  MEDICAL HISTORY:  Past Medical History:  Diagnosis Date   History of cocaine abuse (HCC)    Hypertension    Osteoarthritis     SURGICAL HISTORY: Past Surgical History:  Procedure Laterality Date   KNEE ARTHROPLASTY Left 06/21/11   Ollen Gross, MD    SOCIAL HISTORY: Social History   Socioeconomic History   Marital status: Single    Spouse name: Not on file   Number of children: 0   Years of education: [employer]   Highest education level: Not on file  Occupational History   Occupation: Scientist, forensic: UNEMPLOYED    Comment: since 2009   Occupation: retired  Tobacco Use   Smoking status: Never   Smokeless tobacco: Never  Vaping Use   Vaping Use: Never used  Substance and Sexual Activity   Alcohol use: Not Currently    Comment: beer a week maybe   Drug use: Not Currently    Comment: Last in Nov 2013   Sexual activity: Not Currently  Other Topics Concern   Not on file  Social History Narrative   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 71 yo)   Social Determinants of Health   Financial Resource Strain: Low Risk  (07/07/2021)   Overall Financial Resource Strain (CARDIA)    Difficulty of Paying Living Expenses: Not hard at all  Food Insecurity: No Food Insecurity (07/07/2021)   Hunger Vital Sign    Worried About Running Out of Food in the Last Year: Never true    Ran Out of Food in the Last Year: Never true  Transportation Needs: No Transportation  Needs (07/07/2021)   PRAPARE - Administrator, Civil Service (Medical): No    Lack of Transportation (Non-Medical): No  Physical Activity: Sufficiently Active (07/07/2021)   Exercise Vital Sign    Days of Exercise per Week: 5 days    Minutes of Exercise per Session: 120 min  Stress: No Stress Concern Present (07/07/2021)   [employer] of Occupational Health - Occupational Stress Questionnaire    Feeling of Stress : Not at all  Social Connections: Not on file   Intimate Partner Violence: Not At Risk (06/04/2019)   Humiliation, Afraid, Rape, and Kick questionnaire    Fear of Current or Ex-Partner: No    Emotionally Abused: No    Physically Abused: No    Sexually Abused: No    FAMILY HISTORY: Family History  Problem Relation Age of Onset   Stroke Mother 62   Hypertension Mother    Healthy Father     ALLERGIES:  has No Known Allergies.  MEDICATIONS:  Current Outpatient Medications  Medication Sig Dispense Refill   rivaroxaban (XARELTO) 10 MG TABS tablet Take 1 tablet (10 mg total) by mouth daily. 90 tablet 1   gabapentin (NEURONTIN) 300 MG capsule TAKE 1 CAPSULE BY MOUTH EVERYDAY AT BEDTIME 90 capsule 1   magnesium gluconate (MAGONATE) 500 MG tablet Take 500 mg by mouth daily.     telmisartan (MICARDIS) 20 MG tablet TAKE 1 TABLET BY MOUTH EVERY DAY 90 tablet 1   VITAMIN D, CHOLECALCIFEROL, PO Take by mouth. 2 times per day     No current facility-administered medications for this visit.    REVIEW OF SYSTEMS:   Constitutional: ( - ) fevers, ( - )  chills , ( - ) night sweats Eyes: ( - ) blurriness of vision, ( - ) double vision, ( - ) watery eyes Ears, nose, mouth, throat, and face: ( - ) mucositis, ( - ) sore throat Respiratory: ( - ) cough, ( - ) dyspnea, ( - ) wheezes Cardiovascular: ( - ) palpitation, ( - ) chest discomfort, ( - ) lower extremity swelling Gastrointestinal:  ( - ) nausea, ( - ) heartburn, ( - ) change in bowel habits Skin: ( - ) abnormal skin rashes Lymphatics: ( - ) new lymphadenopathy, ( - ) easy bruising Neurological: ( - ) numbness, ( - ) tingling, ( - ) new weaknesses Behavioral/Psych: ( - ) mood change, ( - ) new changes  All other systems were reviewed with the patient and are negative.  PHYSICAL EXAMINATION:  Vitals:   07/28/22 1357  BP: 130/87  Pulse: (!) 58  Resp: 16  Temp: 97.9 F (36.6 C)  SpO2: 100%   Filed Weights   07/28/22 1357  Weight: 233 lb 9.6 oz (106 kg)    GENERAL:  Well-appearing elderly African-American male, alert, no distress and comfortable SKIN: skin color, texture, turgor are normal, no rashes or significant lesions EYES: conjunctiva are pink and non-injected, sclera clear LUNGS: clear to auscultation and percussion with normal breathing effort HEART: regular rate & rhythm and no murmurs and no lower extremity edema Musculoskeletal: no cyanosis of digits and no clubbing  PSYCH: alert & oriented x 3, fluent speech NEURO: no focal motor/sensory deficits  LABORATORY DATA:  I have reviewed the data as listed    Latest Ref Rng & Units 07/28/2022    1:41 PM 06/27/2022    3:53 PM 01/26/2022    2:41 PM  CBC  WBC 4.0 - 10.5 K/uL  5.2  4.6  7.3   Hemoglobin 13.0 - 17.0 g/dL 14.0  15.1  14.4   Hematocrit 39.0 - 52.0 % 41.0  46.0  44.8   Platelets 150 - 400 K/uL 167  183  216        Latest Ref Rng & Units 07/28/2022    1:41 PM 06/27/2022    3:53 PM 05/26/2022    8:57 AM  CMP  Glucose 70 - 99 mg/dL 96  71  64   BUN 8 - 23 mg/dL 21  18  17    Creatinine 0.61 - 1.24 mg/dL 1.01  0.99  1.04   Sodium 135 - 145 mmol/L 136  137  138   Potassium 3.5 - 5.1 mmol/L 4.4  4.5  4.7   Chloride 98 - 111 mmol/L 105  101  102   CO2 22 - 32 mmol/L 28  25  24    Calcium 8.9 - 10.3 mg/dL 9.5  9.5  9.6   Total Protein 6.5 - 8.1 g/dL 7.2  7.5    Total Bilirubin 0.3 - 1.2 mg/dL 0.6  0.4    Alkaline Phos 38 - 126 U/L 60  78    AST 15 - 41 U/L 32  29    ALT 0 - 44 U/L 21  23      RADIOGRAPHIC STUDIES: No results found.  ASSESSMENT & PLAN Yarin Binger 71 y.o. male with medical history significant for bilateral pulmonary emboli and DVTs who presents for a follow up visit.  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   A provoked venous thromboembolism (VTE) is one that has a clear inciting factor or event. Provoking factors include  prolonged travel/immobility, surgery (particular abdominal or orthropedic), trauma,  and pregnancy/ estrogen containing birth control. After a detailed history and review of the records there is no clear provoking factor for this patient's VTE.  Patients with unprovoked VTEs have up to 25% recurrence after 5 years and 36% at 10 years, with 4% of these clots being fatal (BMJ?2019;366:l4363). Therefore the formal recommendation for unprovoked VTE's is lifelong anticoagulation, as the cause may not be transient or reversible. We recommend 6 months or full strength anticoagulation with a re-evaluation after that time.  The patient's will then have a choice of maintenance dose DOAC (preferred, recommended), 81mg  ASA PO daily (non-preferred), or no further anticoagulation (not recommended).     # Unprovoked DVT/Pulmonary Embolism  --findings at this time are consistent with a unprovoked VTE  --will order CMP and CBC to assure labs are adequate for DOAC therapy  --ruled out APS with anticardiolipin and anti beta2 glycoprotein antibodies.  Lupus anticoagulant panel would be altered by presence of blood thinner, will hold on this testing.   --recommend the patient continue xarelto 20mg  PO daily.  Once he has completed his current prescription will decrease down to Xarelto 10 mg p.o. daily for maintenance therapy. --patient denies any bleeding, bruising, or dark stools on this medication. It is well tolerated. No difficulties accessing/affording the medication  --Labs today show white blood cell count 5.2, hemoglobin 14.0, MCV 85.2, and platelets of 167 --RTC in 6 months' time with strict return precautions for overt signs of bleeding.    No orders of the defined types were placed in this encounter.   All questions were answered. The patient knows to call the clinic with any  problems, questions or concerns.  A total of more than 30 minutes were spent on this encounter with face-to-face time and  non-face-to-face time, including preparing to see the patient, ordering tests and/or medications, counseling the patient and coordination of care as outlined above.   Ledell Peoples, MD Department of Hematology/Oncology [place] [facility] Phone: [phone] Pager: [phone] Email: [email or web address]  07/31/2022 3:19 PM

## 2022-07-31 MED ORDER — RIVAROXABAN 10 MG PO TABS: 10.0000 mg | ORAL_TABLET | Freq: Every day | ORAL | 1 refills | Status: DC

## 2022-08-03 ENCOUNTER — Encounter: Payer: Self-pay | Admitting: Internal Medicine

## 2022-08-03 ENCOUNTER — Encounter: Payer: Self-pay | Admitting: Hematology and Oncology

## 2022-08-03 ENCOUNTER — Ambulatory Visit (INDEPENDENT_AMBULATORY_CARE_PROVIDER_SITE_OTHER): Payer: PPO | Admitting: Internal Medicine

## 2022-08-03 ENCOUNTER — Ambulatory Visit (INDEPENDENT_AMBULATORY_CARE_PROVIDER_SITE_OTHER): Payer: PPO

## 2022-08-03 VITALS — BP 110/62 | HR 82 | Temp 97.7°F | Ht 70.6 in | Wt 230.6 lb

## 2022-08-03 DIAGNOSIS — Z23 Encounter for immunization: Secondary | ICD-10-CM

## 2022-08-03 DIAGNOSIS — I25118 Atherosclerotic heart disease of native coronary artery with other forms of angina pectoris: Secondary | ICD-10-CM | POA: Diagnosis not present

## 2022-08-03 DIAGNOSIS — I119 Hypertensive heart disease without heart failure: Secondary | ICD-10-CM | POA: Diagnosis not present

## 2022-08-03 DIAGNOSIS — E7800 Pure hypercholesterolemia, unspecified: Secondary | ICD-10-CM | POA: Diagnosis not present

## 2022-08-03 DIAGNOSIS — Z0000 Encounter for general adult medical examination without abnormal findings: Secondary | ICD-10-CM | POA: Diagnosis not present

## 2022-08-03 MED ORDER — TELMISARTAN 20 MG PO TABS: 20.0000 mg | ORAL_TABLET | Freq: Every day | ORAL | 1 refills | Status: DC

## 2022-08-03 MED ORDER — ATORVASTATIN CALCIUM 20 MG PO TABS: 20.0000 mg | ORAL_TABLET | Freq: Every day | ORAL | 11 refills | Status: DC

## 2022-08-03 NOTE — Progress Notes (Signed)
Subjective:   Marc [name] is a 71 y.o. male who presents for Medicare Annual/Subsequent preventive examination.  Review of Systems     Cardiac Risk Factors include: advanced age (>65men, >103 women);hypertension;male gender;obesity (BMI >30kg/m2)     Objective:    Today's Vitals   08/03/22 0902  BP: 110/62  Pulse: 82  Temp: 97.7 F (36.5 C)  TempSrc: Oral  SpO2: 95%  Weight: 230 lb 9.6 oz (104.6 kg)  Height: 5' 10.6" (1.793 m)   Body mass index is 32.53 kg/m.     08/03/2022    9:13 AM 07/07/2021   10:12 AM 06/12/2021    9:26 AM 06/09/2020   10:45 AM 06/04/2019    2:29 PM 05/29/2013    8:08 PM  Advanced Directives  Does Patient Have a Medical Advance Directive? No No No No No Patient does not have advance directive;Patient would like information  Would patient like information on creating a medical advance directive? No - Patient declined Yes (MAU/Ambulatory/Procedural Areas - Information given) No - Patient declined Yes (MAU/Ambulatory/Procedural Areas - Information given) Yes (MAU/Ambulatory/Procedural Areas - Information given)     Current Medications (verified) Outpatient Encounter Medications as of 08/03/2022  Medication Sig   gabapentin (NEURONTIN) 300 MG capsule TAKE 1 CAPSULE BY MOUTH EVERYDAY AT BEDTIME   magnesium gluconate (MAGONATE) 500 MG tablet Take 500 mg by mouth daily.   rivaroxaban (XARELTO) 10 MG TABS tablet Take 1 tablet (10 mg total) by mouth daily.   telmisartan (MICARDIS) 20 MG tablet TAKE 1 TABLET BY MOUTH EVERY DAY   VITAMIN D, CHOLECALCIFEROL, PO Take by mouth. 2 times per day   No facility-administered encounter medications on file as of 08/03/2022.    Allergies (verified) Patient has no known allergies.   History: Past Medical History:  Diagnosis Date   History of cocaine abuse (HCC)    Hypertension    Osteoarthritis    Past Surgical History:  Procedure Laterality Date   KNEE ARTHROPLASTY Left 06/21/11   Ollen Gross, MD   Family  History  Problem Relation Age of Onset   Stroke Mother 24   Hypertension Mother    Healthy Father    Social History   Socioeconomic History   Marital status: Single    Spouse name: Not on file   Number of children: 0   Years of education: [employer]   Highest education level: Not on file  Occupational History   Occupation: Scientist, forensic: UNEMPLOYED    Comment: since 2009   Occupation: retired  Tobacco Use   Smoking status: Never   Smokeless tobacco: Never  Vaping Use   Vaping Use: Never used  Substance and Sexual Activity   Alcohol use: Not Currently    Comment: beer a week maybe   Drug use: Not Currently    Comment: Last in Nov 2013   Sexual activity: Not Currently  Other Topics Concern   Not on file  Social History Narrative   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 71 yo)   Social Determinants of Corporate investment banker Strain: Low Risk  (08/03/2022)   Overall Financial Resource Strain (CARDIA)    Difficulty of Paying Living Expenses: Not hard at all  Food Insecurity: No Food Insecurity (08/03/2022)   Hunger Vital Sign    Worried About Running Out of Food in the Last Year: Never true    Ran Out of Food in the Last Year: Never true  Transportation Needs: No Transportation Needs (  08/03/2022)   PRAPARE - Administrator, Civil Service (Medical): No    Lack of Transportation (Non-Medical): No  Physical Activity: Sufficiently Active (08/03/2022)   Exercise Vital Sign    Days of Exercise per Week: 5 days    Minutes of Exercise per Session: 120 min  Stress: No Stress Concern Present (07/07/2021)   [employer] of Occupational Health - Occupational Stress Questionnaire    Feeling of Stress : Not at all  Social Connections: Not on file    Tobacco Counseling Counseling given: Not Answered   Clinical Intake:  Pre-visit preparation completed: Yes  Pain : No/denies pain     Nutritional Status: BMI > 30  Obese Nutritional Risks: None Diabetes:  No  How often do you need to have someone help you when you read instructions, pamphlets, or other written materials from your doctor or pharmacy?: 1 - Never What is the last grade level you completed in school?: COLLEGE  Diabetic? no  Interpreter Needed?: No  Information entered by :: NAllen LPN   Activities of Daily Living    08/03/2022    9:14 AM  In your present state of health, do you have any difficulty performing the following activities:  Hearing? 0  Vision? 0  Difficulty concentrating or making decisions? 0  Walking or climbing stairs? 0  Dressing or bathing? 0  Doing errands, shopping? 0  Preparing Food and eating ? N  Using the Toilet? N  In the past six months, have you accidently leaked urine? N  Do you have problems with loss of bowel control? N  Managing your Medications? N  Managing your Finances? N  Housekeeping or managing your Housekeeping? N    Patient Care Team: Dorothyann Peng, MD as PCP - General (Internal Medicine) Wendall Stade, MD as PCP - Cardiology (Cardiology)  Indicate any recent Medical Services you may have received from other than [facility] providers in the past year (date may be approximate).     Assessment:   This is a routine wellness examination for Marc [name].  Hearing/Vision screen Vision Screening - Comments:: No regular eye exams,  Dietary issues and exercise activities discussed: Current Exercise Habits: Home exercise routine, Type of exercise: strength training/weights;walking;Other - see comments (biking and swimming), Time (Minutes): > 60, Frequency (Times/Week): 5, Weekly Exercise (Minutes/Week): 0   Goals Addressed             This Visit's Progress    Patient Stated       08/03/2022, wants to get rid of love handles       Depression Screen    08/03/2022    9:14 AM 07/07/2021   10:14 AM 06/22/2021    3:34 PM 06/09/2020   10:47 AM 09/04/2019    2:06 PM 06/04/2019    2:31 PM 11/07/2018    2:11 PM  PHQ 2/9 Scores  PHQ - 2  Score 0 0 0 0 0 0 0  PHQ- 9 Score    0  0     Fall Risk    08/03/2022    9:14 AM 07/07/2021   10:13 AM 06/22/2021    3:34 PM 06/09/2020   10:46 AM 09/04/2019    2:06 PM  Fall Risk   Falls in the past year? 0 0 0 0 0  Number falls in past yr: 0  0    Injury with Fall? 0  0    Risk for fall due to : Medication side effect Medication side  effect  Medication side effect   Follow up Falls prevention discussed;Education provided;Falls evaluation completed Falls evaluation completed;Education provided;Falls prevention discussed  Falls evaluation completed;Education provided;Falls prevention discussed     FALL RISK PREVENTION PERTAINING TO THE HOME:  Any stairs in or around the home? No  If so, are there any without handrails? N/a Home free of loose throw rugs in walkways, pet beds, electrical cords, etc? Yes  Adequate lighting in your home to reduce risk of falls? Yes   ASSISTIVE DEVICES UTILIZED TO PREVENT FALLS:  Life alert? No  Use of a cane, walker or w/c? No  Grab bars in the bathroom? No  Shower chair or bench in shower? No  Elevated toilet seat or a handicapped toilet? No   TIMED UP AND GO:  Was the test performed? Yes .  Length of time to ambulate 10 feet: 5 sec.   Gait steady and fast without use of assistive device  Cognitive Function:        08/03/2022    9:15 AM 07/07/2021   10:16 AM 06/09/2020   10:50 AM 06/04/2019    2:34 PM  6CIT Screen  What Year? 0 points 0 points 0 points 0 points  What month? 0 points 0 points 0 points 0 points  What time? 0 points 0 points 0 points 0 points  Count back from 20 0 points 0 points 0 points 0 points  Months in reverse 0 points 0 points 0 points 0 points  Repeat phrase 0 points 0 points 0 points 0 points  Total Score 0 points 0 points 0 points 0 points    Immunizations Immunization History  Administered Date(s) Administered   Fluad Quad(high Dose 65+) 08/14/2019   Influenza, High Dose Seasonal PF 08/27/2018, 08/09/2021    Influenza-Unspecified 08/14/2019, 07/23/2020   Moderna Covid-19 Vaccine Bivalent Booster 31yrs & up 08/10/2021   Moderna Sars-Covid-2 Vaccination 12/19/2019, 01/18/2020, 09/21/2020, 04/24/2021   Pneumococcal Conjugate-13 05/10/2018   Pneumococcal Polysaccharide-23 06/08/2019   Pneumococcal-Unspecified 05/10/2018   Tdap 06/04/2019   Zoster Recombinat (Shingrix) 05/09/2020, 07/23/2020    TDAP status: Up to date  Flu Vaccine status: Due, Education has been provided regarding the importance of this vaccine. Advised may receive this vaccine at local pharmacy or Health Dept. Aware to provide a copy of the vaccination record if obtained from local pharmacy or Health Dept. Verbalized acceptance and understanding.  Pneumococcal vaccine status: Up to date  Covid-19 vaccine status: Completed vaccines  Qualifies for Shingles Vaccine? Yes   Zostavax completed Yes   Shingrix Completed?: Yes  Screening Tests Health Maintenance  Topic Date Due   COVID-19 Vaccine (6 - Moderna series) 12/10/2021   INFLUENZA VACCINE  06/27/2022   COLONOSCOPY (Pts 45-8yrs Insurance coverage will need to be confirmed)  03/28/2026   TETANUS/TDAP  06/03/2029   Pneumonia Vaccine 39+ Years old  Completed   Hepatitis C Screening  Completed   Zoster Vaccines- Shingrix  Completed   HPV VACCINES  Aged Out    Health Maintenance  Health Maintenance Due  Topic Date Due   COVID-19 Vaccine (6 - Moderna series) 12/10/2021   INFLUENZA VACCINE  06/27/2022    Colorectal cancer screening: Type of screening: Colonoscopy. Completed 03/28/2016. Repeat every 10 years  Lung Cancer Screening: (Low Dose CT Chest recommended if Age 40-80 years, 30 pack-year currently smoking OR have quit w/in 15years.) does not qualify.   Lung Cancer Screening Referral: no  Additional Screening:  Hepatitis C Screening: does qualify; Completed 06/04/2019  Vision  Screening: Recommended annual ophthalmology exams for early detection of glaucoma and  other disorders of the eye. Is the patient up to date with their annual eye exam?  No  Who is the provider or what is the name of the office in which the patient attends annual eye exams? none If pt is not established with a provider, would they like to be referred to a provider to establish care? No .   Dental Screening: Recommended annual dental exams for proper oral hygiene  Community Resource Referral / Chronic Care Management: CRR required this visit?  No   CCM required this visit?  No      Plan:     I have personally reviewed and noted the following in the patient's chart:   Medical and social history Use of alcohol, tobacco or illicit drugs  Current medications and supplements including opioid prescriptions. Patient is not currently taking opioid prescriptions. Functional ability and status Nutritional status Physical activity Advanced directives List of other physicians Hospitalizations, surgeries, and ER visits in previous 12 months Vitals Screenings to include cognitive, depression, and falls Referrals and appointments  In addition, I have reviewed and discussed with patient certain preventive protocols, quality metrics, and best practice recommendations. A written personalized care plan for preventive services as well as general preventive health recommendations were provided to patient.     Barb Merino, LPN   [id number]   Nurse Notes: none

## 2022-08-03 NOTE — Patient Instructions (Signed)
[name] , Thank you for taking time to come for your Medicare Wellness Visit. I appreciate your ongoing commitment to your health goals. Please review the following plan we discussed and let me know if I can assist you in the future.   Screening recommendations/referrals: Colonoscopy: completed 03/28/2016, due 03/28/2026 Recommended yearly ophthalmology/optometry visit for glaucoma screening and checkup Recommended yearly dental visit for hygiene and checkup  Vaccinations: Influenza vaccine: due Pneumococcal vaccine: completed 06/08/2019 Tdap vaccine: completed 06/04/2019, due 06/03/2029 Shingles vaccine: completed   Covid-19:  08/10/2021, 04/24/2021, 09/21/2020, 01/18/2020, 12/19/2019  Advanced directives: Advance directive discussed with you today. Even though you declined this today please call our office should you change your mind and we can give you the proper paperwork for you to fill out.  Conditions/risks identified: none  Next appointment: Follow up in one year for your annual wellness visit.   Preventive Care 24 Years and Older, Male Preventive care refers to lifestyle choices and visits with your health care provider that can promote health and wellness. What does preventive care include? A yearly physical exam. This is also called an annual well check. Dental exams once or twice a year. Routine eye exams. Ask your health care provider how often you should have your eyes checked. Personal lifestyle choices, including: Daily care of your teeth and gums. Regular physical activity. Eating a healthy diet. Avoiding tobacco and drug use. Limiting alcohol use. Practicing safe sex. Taking low doses of aspirin every day. Taking vitamin and mineral supplements as recommended by your health care provider. What happens during an annual well check? The services and screenings done by your health care provider during your annual well check will depend on your age, overall health, lifestyle  risk factors, and family history of disease. Counseling  Your health care provider may ask you questions about your: Alcohol use. Tobacco use. Drug use. Emotional well-being. Home and relationship well-being. Sexual activity. Eating habits. History of falls. Memory and ability to understand (cognition). Work and work Astronomer. Screening  You may have the following tests or measurements: Height, weight, and BMI. Blood pressure. Lipid and cholesterol levels. These may be checked every 5 years, or more frequently if you are over 34 years old. Skin check. Lung cancer screening. You may have this screening every year starting at age 50 if you have a 30-pack-year history of smoking and currently smoke or have quit within the past 15 years. Fecal occult blood test (FOBT) of the stool. You may have this test every year starting at age 72. Flexible sigmoidoscopy or colonoscopy. You may have a sigmoidoscopy every 5 years or a colonoscopy every 10 years starting at age 53. Prostate cancer screening. Recommendations will vary depending on your family history and other risks. Hepatitis C blood test. Hepatitis B blood test. Sexually transmitted disease (STD) testing. Diabetes screening. This is done by checking your blood sugar (glucose) after you have not eaten for a while (fasting). You may have this done every 1-3 years. Abdominal aortic aneurysm (AAA) screening. You may need this if you are a current or former smoker. Osteoporosis. You may be screened starting at age 35 if you are at high risk. Talk with your health care provider about your test results, treatment options, and if necessary, the need for more tests. Vaccines  Your health care provider may recommend certain vaccines, such as: Influenza vaccine. This is recommended every year. Tetanus, diphtheria, and acellular pertussis (Tdap, Td) vaccine. You may need a Td booster every 10  years. Zoster vaccine. You may need this after age  15. Pneumococcal 13-valent conjugate (PCV13) vaccine. One dose is recommended after age 63. Pneumococcal polysaccharide (PPSV23) vaccine. One dose is recommended after age 49. Talk to your health care provider about which screenings and vaccines you need and how often you need them. This information is not intended to replace advice given to you by your health care provider. Make sure you discuss any questions you have with your health care provider. Document Released: 12/10/2015 Document Revised: 08/02/2016 Document Reviewed: 09/14/2015 Elsevier Interactive Patient Education  2017 [place] Prevention in the Home Falls can cause injuries. They can happen to people of all ages. There are many things you can do to make your home safe and to help prevent falls. What can I do on the outside of my home? Regularly fix the edges of walkways and driveways and fix any cracks. Remove anything that might make you trip as you walk through a door, such as a raised step or threshold. Trim any bushes or trees on the path to your home. Use bright outdoor lighting. Clear any walking paths of anything that might make someone trip, such as rocks or tools. Regularly check to see if handrails are loose or broken. Make sure that both sides of any steps have handrails. Any raised decks and porches should have guardrails on the edges. Have any leaves, snow, or ice cleared regularly. Use sand or salt on walking paths during winter. Clean up any spills in your garage right away. This includes oil or grease spills. What can I do in the bathroom? Use night lights. Install grab bars by the toilet and in the tub and shower. Do not use towel bars as grab bars. Use non-skid mats or decals in the tub or shower. If you need to sit down in the shower, use a plastic, non-slip stool. Keep the floor dry. Clean up any water that spills on the floor as soon as it happens. Remove soap buildup in the tub or shower  regularly. Attach bath mats securely with double-sided non-slip rug tape. Do not have throw rugs and other things on the floor that can make you trip. What can I do in the bedroom? Use night lights. Make sure that you have a light by your bed that is easy to reach. Do not use any sheets or blankets that are too big for your bed. They should not hang down onto the floor. Have a firm chair that has side arms. You can use this for support while you get dressed. Do not have throw rugs and other things on the floor that can make you trip. What can I do in the kitchen? Clean up any spills right away. Avoid walking on wet floors. Keep items that you use a lot in easy-to-reach places. If you need to reach something above you, use a strong step stool that has a grab bar. Keep electrical cords out of the way. Do not use floor polish or wax that makes floors slippery. If you must use wax, use non-skid floor wax. Do not have throw rugs and other things on the floor that can make you trip. What can I do with my stairs? Do not leave any items on the stairs. Make sure that there are handrails on both sides of the stairs and use them. Fix handrails that are broken or loose. Make sure that handrails are as long as the stairways. Check any carpeting to make sure  that it is firmly attached to the stairs. Fix any carpet that is loose or worn. Avoid having throw rugs at the top or bottom of the stairs. If you do have throw rugs, attach them to the floor with carpet tape. Make sure that you have a light switch at the top of the stairs and the bottom of the stairs. If you do not have them, ask someone to add them for you. What else can I do to help prevent falls? Wear shoes that: Do not have high heels. Have rubber bottoms. Are comfortable and fit you well. Are closed at the toe. Do not wear sandals. If you use a stepladder: Make sure that it is fully opened. Do not climb a closed stepladder. Make sure that  both sides of the stepladder are locked into place. Ask someone to hold it for you, if possible. Clearly mark and make sure that you can see: Any grab bars or handrails. First and last steps. Where the edge of each step is. Use tools that help you move around (mobility aids) if they are needed. These include: Canes. Walkers. Scooters. Crutches. Turn on the lights when you go into a dark area. Replace any light bulbs as soon as they burn out. Set up your furniture so you have a clear path. Avoid moving your furniture around. If any of your floors are uneven, fix them. If there are any pets around you, be aware of where they are. Review your medicines with your doctor. Some medicines can make you feel dizzy. This can increase your chance of falling. Ask your doctor what other things that you can do to help prevent falls. This information is not intended to replace advice given to you by your health care provider. Make sure you discuss any questions you have with your health care provider. Document Released: 09/09/2009 Document Revised: 04/20/2016 Document Reviewed: 12/18/2014 Elsevier Interactive Patient Education  2017 [employer].

## 2022-08-03 NOTE — Progress Notes (Signed)
Jeri Cos Llittleton,acting as a Neurosurgeon for Gwynneth Aliment, MD.,have documented all relevant documentation on the behalf of Gwynneth Aliment, MD,as directed by  Gwynneth Aliment, MD while in the presence of Gwynneth Aliment, MD.    Subjective:     Patient ID: Marc [name] , male    DOB: 03/27/1951 , 71 y.o.   MRN: [id number]   Chief Complaint  Patient presents with   Hypertension    HPI  The patient is here today for blood pressure f/u.  He reports compliance with meds. He denies headaches, chest pain and shortness of breath.   Additionally, he was supposed to have started on atorvastatin. However, he states the pharmacy never received the rx. He is also scheduled for AWV with [facility] Advisor.   Hypertension This is a chronic problem. The current episode started more than 1 year ago. The problem has been gradually improving since onset. The problem is controlled. Pertinent negatives include no blurred vision, chest pain, headaches or shortness of breath. Neck pain: he has been eval by Dr. Shon Baton. He has had some relief of his sx. Now on higher dose of gabapentin. He has gone to PT. He has had Nerve conduction testing by Dr. Ethelene Hal. He does feel he is starting to improve. .Past treatments include angiotensin blockers. The current treatment provides moderate improvement. There are no compliance problems.      Past Medical History:  Diagnosis Date   History of cocaine abuse (HCC)    Hypertension    Osteoarthritis      Family History  Problem Relation Age of Onset   Stroke Mother 29   Hypertension Mother    Healthy Father      Current Outpatient Medications:    atorvastatin (LIPITOR) 20 MG tablet, Take 1 tablet (20 mg total) by mouth daily., Disp: 30 tablet, Rfl: 11   gabapentin (NEURONTIN) 300 MG capsule, TAKE 1 CAPSULE BY MOUTH EVERYDAY AT BEDTIME, Disp: 90 capsule, Rfl: 1   magnesium gluconate (MAGONATE) 500 MG tablet, Take 500 mg by mouth daily., Disp: , Rfl:    rivaroxaban  (XARELTO) 10 MG TABS tablet, Take 1 tablet (10 mg total) by mouth daily., Disp: 90 tablet, Rfl: 1   telmisartan (MICARDIS) 20 MG tablet, Take 1 tablet (20 mg total) by mouth daily., Disp: 90 tablet, Rfl: 1   VITAMIN D, CHOLECALCIFEROL, PO, Take by mouth. 2 times per day, Disp: , Rfl:    No Known Allergies   Review of Systems  Constitutional: Negative.   Eyes:  Negative for blurred vision.  Respiratory: Negative.  Negative for shortness of breath.   Cardiovascular: Negative.  Negative for chest pain.  Gastrointestinal: Negative.   Musculoskeletal:  Neck pain: he has been eval by Dr. Shon Baton. He has had some relief of his sx. Now on higher dose of gabapentin. He has gone to PT. He has had Nerve conduction testing by Dr. Ethelene Hal. He does feel he is starting to improve. .  Neurological: Negative.  Negative for headaches.  Psychiatric/Behavioral: Negative.       Today's Vitals   08/03/22 1010  BP: 110/62  Pulse: 82  Temp: 97.7 F (36.5 C)  Weight: 230 lb 9.6 oz (104.6 kg)  Height: 5' 10.6" (1.793 m)   Body mass index is 32.53 kg/m.   Objective:  Physical Exam Vitals and nursing note reviewed.  Constitutional:      Appearance: Normal appearance.  HENT:     Head: Normocephalic and atraumatic.  Eyes:  Extraocular Movements: Extraocular movements intact.  Cardiovascular:     Rate and Rhythm: Normal rate and regular rhythm.     Heart sounds: Normal heart sounds.  Pulmonary:     Effort: Pulmonary effort is normal.     Breath sounds: Normal breath sounds.  Musculoskeletal:     Cervical back: Normal range of motion.  Skin:    General: Skin is warm.  Neurological:     General: No focal deficit present.     Mental Status: He is alert.  Psychiatric:        Mood and Affect: Mood normal.       Assessment And Plan:     1. Hypertensive heart disease without heart failure Comments: Chronic, no med changes.  He will c/w telmisartan 20mg  daily.   2. Coronary artery disease of  native artery of native heart with stable angina pectoris (HCC) Comments: Chronic, stable. He is currently on Xarelto, he is finally agreeable to statin use.   3. Pure hypercholesterolemia Comments: Chronic, I will send rx atorvastatin 20mg  daily. He agrees to rto in 6-8 weeks for a chol check.  - Lipid panel; Future - ALT; Future  4. Immunization due - Flu Vaccine QUAD High Dose(Fluad)   Patient was given opportunity to ask questions. Patient verbalized understanding of the plan and was able to repeat key elements of the plan. All questions were answered to their satisfaction.   I, , MD, have reviewed all documentation for this visit. The documentation on 08/04/22 for the exam, diagnosis, procedures, and orders are all accurate and complete.   IF YOU HAVE BEEN REFERRED TO A SPECIALIST, IT MAY TAKE 1-2 WEEKS TO SCHEDULE/PROCESS THE REFERRAL. IF YOU HAVE NOT HEARD FROM US/SPECIALIST IN TWO WEEKS, PLEASE GIVE Gwynneth Aliment A CALL AT [phone].   THE PATIENT IS ENCOURAGED TO PRACTICE SOCIAL DISTANCING DUE TO THE COVID-19 PANDEMIC.

## 2022-08-03 NOTE — Patient Instructions (Signed)
Hypertension, Adult ?Hypertension is another name for high blood pressure. High blood pressure forces your heart to work harder to pump blood. This can cause problems over time. ?There are two numbers in a blood pressure reading. There is a top number (systolic) over a bottom number (diastolic). It is best to have a blood pressure that is below 120/80. ?What are the causes? ?The cause of this condition is not known. Some other conditions can lead to high blood pressure. ?What increases the risk? ?Some lifestyle factors can make you more likely to develop high blood pressure: ?Smoking. ?Not getting enough exercise or physical activity. ?Being overweight. ?Having too much fat, sugar, calories, or salt (sodium) in your diet. ?Drinking too much alcohol. ?Other risk factors include: ?Having any of these conditions: ?Heart disease. ?Diabetes. ?High cholesterol. ?Kidney disease. ?Obstructive sleep apnea. ?Having a family history of high blood pressure and high cholesterol. ?Age. The risk increases with age. ?Stress. ?What are the signs or symptoms? ?High blood pressure may not cause symptoms. Very high blood pressure (hypertensive crisis) may cause: ?Headache. ?Fast or uneven heartbeats (palpitations). ?Shortness of breath. ?Nosebleed. ?Vomiting or feeling like you may vomit (nauseous). ?Changes in how you see. ?Very bad chest pain. ?Feeling dizzy. ?Seizures. ?How is this treated? ?This condition is treated by making healthy lifestyle changes, such as: ?Eating healthy foods. ?Exercising more. ?Drinking less alcohol. ?Your doctor may prescribe medicine if lifestyle changes do not help enough and if: ?Your top number is above 130. ?Your bottom number is above 80. ?Your personal target blood pressure may vary. ?Follow these instructions at home: ?Eating and drinking ? ?If told, follow the DASH eating plan. To follow this plan: ?Fill one half of your plate at each meal with fruits and vegetables. ?Fill one fourth of your plate  at each meal with whole grains. Whole grains include whole-wheat pasta, brown rice, and whole-grain bread. ?Eat or drink low-fat dairy products, such as skim milk or low-fat yogurt. ?Fill one fourth of your plate at each meal with low-fat (lean) proteins. Low-fat proteins include fish, chicken without skin, eggs, beans, and tofu. ?Avoid fatty meat, cured and processed meat, or chicken with skin. ?Avoid pre-made or processed food. ?Limit the amount of salt in your diet to less than 1,500 mg each day. ?Do not drink alcohol if: ?Your doctor tells you not to drink. ?You are pregnant, may be pregnant, or are planning to become pregnant. ?If you drink alcohol: ?Limit how much you have to: ?0-1 drink a day for women. ?0-2 drinks a day for men. ?Know how much alcohol is in your drink. In the [place] one drink equals one 12 oz bottle of beer (355 mL), one 5 oz glass of wine (148 mL), or one 1? oz glass of hard liquor (44 mL). ?Lifestyle ? ?Work with your doctor to stay at a healthy weight or to lose weight. Ask your doctor what the best weight is for you. ?Get at least 30 minutes of exercise that causes your heart to beat faster (aerobic exercise) most days of the week. This may include walking, swimming, or biking. ?Get at least 30 minutes of exercise that strengthens your muscles (resistance exercise) at least 3 days a week. This may include lifting weights or doing Pilates. ?Do not smoke or use any products that contain nicotine or tobacco. If you need help quitting, ask your doctor. ?Check your blood pressure at home as told by your doctor. ?Keep all follow-up visits. ?Medicines ?Take over-the-counter and prescription medicines   only as told by your doctor. Follow directions carefully. ?Do not skip doses of blood pressure medicine. The medicine does not work as well if you skip doses. Skipping doses also puts you at risk for problems. ?Ask your doctor about side effects or reactions to medicines that you should watch  for. ?Contact a doctor if: ?You think you are having a reaction to the medicine you are taking. ?You have headaches that keep coming back. ?You feel dizzy. ?You have swelling in your ankles. ?You have trouble with your vision. ?Get help right away if: ?You get a very bad headache. ?You start to feel mixed up (confused). ?You feel weak or numb. ?You feel faint. ?You have very bad pain in your: ?Chest. ?Belly (abdomen). ?You vomit more than once. ?You have trouble breathing. ?These symptoms may be an emergency. Get help right away. Call 911. ?Do not wait to see if the symptoms will go away. ?Do not drive yourself to the hospital. ?Summary ?Hypertension is another name for high blood pressure. ?High blood pressure forces your heart to work harder to pump blood. ?For most people, a normal blood pressure is less than 120/80. ?Making healthy choices can help lower blood pressure. If your blood pressure does not get lower with healthy choices, you may need to take medicine. ?This information is not intended to replace advice given to you by your health care provider. Make sure you discuss any questions you have with your health care provider. ?Document Revised: 09/01/2021 Document Reviewed: 09/01/2021 ?Elsevier Patient Education ? 2023 Elsevier Inc. ? ?

## 2022-08-08 ENCOUNTER — Ambulatory Visit: Payer: PPO

## 2022-08-24 DIAGNOSIS — R972 Elevated prostate specific antigen [PSA]: Secondary | ICD-10-CM | POA: Diagnosis not present

## 2022-08-31 DIAGNOSIS — R972 Elevated prostate specific antigen [PSA]: Secondary | ICD-10-CM | POA: Diagnosis not present

## 2022-09-20 ENCOUNTER — Encounter: Payer: Self-pay | Admitting: Internal Medicine

## 2022-09-20 ENCOUNTER — Ambulatory Visit (INDEPENDENT_AMBULATORY_CARE_PROVIDER_SITE_OTHER): Payer: PPO | Admitting: Internal Medicine

## 2022-09-20 VITALS — BP 110/78 | HR 87 | Temp 97.7°F | Ht 74.0 in | Wt 227.8 lb

## 2022-09-20 DIAGNOSIS — Z79899 Other long term (current) drug therapy: Secondary | ICD-10-CM

## 2022-09-20 DIAGNOSIS — E7800 Pure hypercholesterolemia, unspecified: Secondary | ICD-10-CM | POA: Diagnosis not present

## 2022-09-20 DIAGNOSIS — Z6829 Body mass index (BMI) 29.0-29.9, adult: Secondary | ICD-10-CM

## 2022-09-20 DIAGNOSIS — E663 Overweight: Secondary | ICD-10-CM

## 2022-09-20 NOTE — Progress Notes (Signed)
Barnet Glasgow Martin,acting as a Education administrator for [name], MD.,have documented all relevant documentation on the behalf of [name], MD,as directed by  [name], MD while in the presence of [name], MD.    Subjective:     Patient ID: Marc [name] , male    DOB: 08-Sep-1951 , 71 y.o.   MRN: [id number]   Chief Complaint  Patient presents with   Hyperlipidemia    HPI  Patient presents today for a Cholesterol check, patient states compliance with medications and has no other issues. He was started on atorvastatin 20mg  daily at his last visit. He has not had any issues with the medication.   Patient states his gabapentin ran out, but he wants to try to go without for a while. He is not sure if it helped to relieve his sx of neck pain. He ran out last week.   BP Readings from Last 3 Encounters: 09/20/22 : 110/78 08/03/22 : 110/62 08/03/22 : 110/62    Hyperlipidemia This is a chronic problem. The current episode started more than 1 year ago. He has no history of diabetes or liver disease. Pertinent negatives include no chest pain, leg pain or myalgias. Current antihyperlipidemic treatment includes statins, exercise and diet change. There are no compliance problems.  Risk factors for coronary artery disease include dyslipidemia and male sex.     Past Medical History:  Diagnosis Date   History of cocaine abuse ([place])    Hypertension    Osteoarthritis      Family History  Problem Relation Age of Onset   Stroke Mother 34   Hypertension Mother    Healthy Father      Current Outpatient Medications:    atorvastatin (LIPITOR) 20 MG tablet, Take 1 tablet (20 mg total) by mouth daily., Disp: 30 tablet, Rfl: 11   magnesium gluconate (MAGONATE) 500 MG tablet, Take 500 mg by mouth daily., Disp: , Rfl:    rivaroxaban (XARELTO) 10 MG TABS tablet, Take 1 tablet (10 mg total) by mouth daily., Disp: 90 tablet, Rfl: 1   telmisartan (MICARDIS) 20 MG tablet, Take 1 tablet (20  mg total) by mouth daily., Disp: 90 tablet, Rfl: 1   VITAMIN D, CHOLECALCIFEROL, PO, Take by mouth. 2 times per day, Disp: , Rfl:    gabapentin (NEURONTIN) 300 MG capsule, TAKE 1 CAPSULE BY MOUTH EVERYDAY AT BEDTIME (Patient not taking: Reported on 09/20/2022), Disp: 90 capsule, Rfl: 1   No Known Allergies   Review of Systems  Constitutional: Negative.   HENT: Negative.    Eyes: Negative.   Respiratory: Negative.    Cardiovascular: Negative.  Negative for chest pain.  Gastrointestinal: Negative.   Musculoskeletal:  Negative for myalgias.     Today's Vitals   09/20/22 1623  BP: 110/78  Pulse: 87  Temp: 97.7 F (36.5 C)  TempSrc: Oral  Weight: 227 lb 12.8 oz (103.3 kg)  Height: 6\' 2"  (1.88 m)  PainSc: 0-No pain   Body mass index is 29.25 kg/m.  Wt Readings from Last 3 Encounters:  09/20/22 227 lb 12.8 oz (103.3 kg)  08/03/22 230 lb 9.6 oz (104.6 kg)  08/03/22 230 lb 9.6 oz (104.6 kg)    Objective:  Physical Exam Vitals and nursing note reviewed.  Constitutional:      Appearance: Normal appearance.  HENT:     Head: Normocephalic and atraumatic.     Nose:     Comments: Masked     Mouth/Throat:  Comments: Masked  Eyes:     Extraocular Movements: Extraocular movements intact.  Cardiovascular:     Rate and Rhythm: Normal rate and regular rhythm.     Heart sounds: Normal heart sounds.  Pulmonary:     Effort: Pulmonary effort is normal.     Breath sounds: Normal breath sounds.  Musculoskeletal:     Cervical back: Normal range of motion.  Skin:    General: Skin is warm.  Neurological:     General: No focal deficit present.     Mental Status: He is alert.  Psychiatric:        Mood and Affect: Mood normal.     Assessment And Plan:     1. Pure hypercholesterolemia Comments: Chronic, I will check lipid panel and ALT today. He will rto in Feb 2024 for his next evaluation. He will c/w atorvastatin 20mg  daily for now.  - Lipid panel - ALT  2. Overweight with  body mass index (BMI) of 29 to 29.9 in adult Comments: He is encouraged to aim for at least 150 minutes of exercise per week.   3. Drug therapy   Patient was given opportunity to ask questions. Patient verbalized understanding of the plan and was able to repeat key elements of the plan. All questions were answered to their satisfaction.   I, , MD, have reviewed all documentation for this visit. The documentation on 09/20/22 for the exam, diagnosis, procedures, and orders are all accurate and complete.   IF YOU HAVE BEEN REFERRED TO A SPECIALIST, IT MAY TAKE 1-2 WEEKS TO SCHEDULE/PROCESS THE REFERRAL. IF YOU HAVE NOT HEARD FROM US/SPECIALIST IN TWO WEEKS, PLEASE GIVE 09/22/22 A CALL AT [phone].   THE PATIENT IS ENCOURAGED TO PRACTICE SOCIAL DISTANCING DUE TO THE COVID-19 PANDEMIC.

## 2022-09-20 NOTE — Patient Instructions (Signed)
Cholesterol Content in Foods ?Cholesterol is a waxy, fat-like substance that helps to carry fat in the blood. The body needs cholesterol in small amounts, but too much cholesterol can cause damage to the arteries and heart. ?What foods have cholesterol? ? ?Cholesterol is found in animal-based foods, such as meat, seafood, and dairy. Generally, low-fat dairy and lean meats have less cholesterol than full-fat dairy and fatty meats. The milligrams of cholesterol per serving (mg per serving) of common cholesterol-containing foods are listed below. ?Meats and other proteins ?Egg -- one large whole egg has 186 mg. ?Veal shank -- 4 oz (113 g) has 141 mg. ?Lean ground turkey (93% lean) -- 4 oz (113 g) has 118 mg. ?Fat-trimmed lamb loin -- 4 oz (113 g) has 106 mg. ?Lean ground beef (90% lean) -- 4 oz (113 g) has 100 mg. ?Lobster -- 3.5 oz (99 g) has 90 mg. ?Pork loin chops -- 4 oz (113 g) has 86 mg. ?Canned salmon -- 3.5 oz (99 g) has 83 mg. ?Fat-trimmed beef top loin -- 4 oz (113 g) has 78 mg. ?Frankfurter -- 1 frank (3.5 oz or 99 g) has 77 mg. ?Crab -- 3.5 oz (99 g) has 71 mg. ?Roasted chicken without skin, white meat -- 4 oz (113 g) has 66 mg. ?Light bologna -- 2 oz (57 g) has 45 mg. ?Deli-cut turkey -- 2 oz (57 g) has 31 mg. ?Canned tuna -- 3.5 oz (99 g) has 31 mg. ?Bacon -- 1 oz (28 g) has 29 mg. ?Oysters and mussels (raw) -- 3.5 oz (99 g) has 25 mg. ?Mackerel -- 1 oz (28 g) has 22 mg. ?Trout -- 1 oz (28 g) has 20 mg. ?Pork sausage -- 1 link (1 oz or 28 g) has 17 mg. ?Salmon -- 1 oz (28 g) has 16 mg. ?Tilapia -- 1 oz (28 g) has 14 mg. ?Dairy ?Soft-serve ice cream -- ? cup (4 oz or 86 g) has 103 mg. ?Whole-milk yogurt -- 1 cup (8 oz or 245 g) has 29 mg. ?Cheddar cheese -- 1 oz (28 g) has 28 mg. ?American cheese -- 1 oz (28 g) has 28 mg. ?Whole milk -- 1 cup (8 oz or 250 mL) has 23 mg. ?2% milk -- 1 cup (8 oz or 250 mL) has 18 mg. ?Cream cheese -- 1 tablespoon (Tbsp) (14.5 g) has 15 mg. ?Cottage cheese -- ? cup (4 oz or  113 g) has 14 mg. ?Low-fat (1%) milk -- 1 cup (8 oz or 250 mL) has 10 mg. ?Sour cream -- 1 Tbsp (12 g) has 8.5 mg. ?Low-fat yogurt -- 1 cup (8 oz or 245 g) has 8 mg. ?Nonfat Greek yogurt -- 1 cup (8 oz or 228 g) has 7 mg. ?Half-and-half cream -- 1 Tbsp (15 mL) has 5 mg. ?Fats and oils ?Cod liver oil -- 1 tablespoon (Tbsp) (13.6 g) has 82 mg. ?Butter -- 1 Tbsp (14 g) has 15 mg. ?Lard -- 1 Tbsp (12.8 g) has 14 mg. ?Bacon grease -- 1 Tbsp (12.9 g) has 14 mg. ?Mayonnaise -- 1 Tbsp (13.8 g) has 5-10 mg. ?Margarine -- 1 Tbsp (14 g) has 3-10 mg. ?The items listed above may not be a complete list of foods with cholesterol. Exact amounts of cholesterol in these foods may vary depending on specific ingredients and brands. Contact a dietitian for more information. ?What foods do not have cholesterol? ?Most plant-based foods do not have cholesterol unless you combine them with a food that has   cholesterol. Foods without cholesterol include: ?Grains and cereals. ?Vegetables. ?Fruits. ?Vegetable oils, such as olive, canola, and sunflower oil. ?Legumes, such as peas, beans, and lentils. ?Nuts and seeds. ?Egg whites. ?The items listed above may not be a complete list of foods that do not have cholesterol. Contact a dietitian for more information. ?Summary ?The body needs cholesterol in small amounts, but too much cholesterol can cause damage to the arteries and heart. ?Cholesterol is found in animal-based foods, such as meat, seafood, and dairy. Generally, low-fat dairy and lean meats have less cholesterol than full-fat dairy and fatty meats. ?This information is not intended to replace advice given to you by your health care provider. Make sure you discuss any questions you have with your health care provider. ?Document Revised: 03/25/2021 Document Reviewed: 03/25/2021 ?Elsevier Patient Education ? 2023 Elsevier Inc. ? ?

## 2022-09-21 LAB — ALT: ALT: 32 IU/L (ref 0–44)

## 2022-09-21 LAB — LIPID PANEL
Chol/HDL Ratio: 2.4 ratio (ref 0.0–5.0)
Cholesterol, Total: 166 mg/dL (ref 100–199)
HDL: 70 mg/dL (ref >39)
LDL Chol Calc (NIH): 86 mg/dL (ref 0–99)
Triglycerides: 47 mg/dL (ref 0–149)
VLDL Cholesterol Cal: 10 mg/dL (ref 5–40)

## 2022-10-06 ENCOUNTER — Other Ambulatory Visit: Payer: Self-pay

## 2022-10-06 DIAGNOSIS — E785 Hyperlipidemia, unspecified: Secondary | ICD-10-CM

## 2022-10-06 DIAGNOSIS — E7800 Pure hypercholesterolemia, unspecified: Secondary | ICD-10-CM

## 2022-10-06 MED ORDER — ATORVASTATIN CALCIUM 40 MG PO TABS: ORAL_TABLET | ORAL | 11 refills | Status: DC

## 2022-10-11 ENCOUNTER — Other Ambulatory Visit: Payer: Self-pay

## 2022-10-11 MED ORDER — GABAPENTIN 300 MG PO CAPS: ORAL_CAPSULE | ORAL | 1 refills | Status: DC

## 2022-12-28 ENCOUNTER — Encounter: Payer: Self-pay | Admitting: Internal Medicine

## 2022-12-28 ENCOUNTER — Ambulatory Visit (INDEPENDENT_AMBULATORY_CARE_PROVIDER_SITE_OTHER): Payer: PPO | Admitting: Internal Medicine

## 2022-12-28 VITALS — BP 122/78 | HR 75 | Temp 98.0°F | Ht 74.0 in | Wt 234.0 lb

## 2022-12-28 DIAGNOSIS — E6609 Other obesity due to excess calories: Secondary | ICD-10-CM | POA: Diagnosis not present

## 2022-12-28 DIAGNOSIS — E559 Vitamin D deficiency, unspecified: Secondary | ICD-10-CM | POA: Diagnosis not present

## 2022-12-28 DIAGNOSIS — E7800 Pure hypercholesterolemia, unspecified: Secondary | ICD-10-CM

## 2022-12-28 DIAGNOSIS — I25118 Atherosclerotic heart disease of native coronary artery with other forms of angina pectoris: Secondary | ICD-10-CM | POA: Insufficient documentation

## 2022-12-28 DIAGNOSIS — D6869 Other thrombophilia: Secondary | ICD-10-CM

## 2022-12-28 DIAGNOSIS — Z6830 Body mass index (BMI) 30.0-30.9, adult: Secondary | ICD-10-CM | POA: Diagnosis not present

## 2022-12-28 DIAGNOSIS — E66811 Obesity, class 1: Secondary | ICD-10-CM

## 2022-12-28 DIAGNOSIS — I119 Hypertensive heart disease without heart failure: Secondary | ICD-10-CM | POA: Diagnosis not present

## 2022-12-28 NOTE — Progress Notes (Signed)
I,Victoria T Hamilton,acting as a scribe for [name], MD.,have documented all relevant documentation on the behalf of [name], MD,as directed by  [name], MD while in the presence of [name], MD.    Subjective:     Patient ID: Marc [name] , male    DOB: Jul 05, 1951 , 72 y.o.   MRN: [id number]   Chief Complaint  Patient presents with   Hypertension   Hyperlipidemia    HPI  The patient is here today for blood pressure f/u.  He reports compliance with meds. He denies headaches, chest pain and shortness of breath. He exercises regularly at the [facility].  He has no specific concerns or complaints at this time.     Hypertension This is a chronic problem. The current episode started more than 1 year ago. The problem has been gradually improving since onset. The problem is controlled. Pertinent negatives include no blurred vision, chest pain, headaches or shortness of breath. Neck pain: he has been eval by Dr. Rolena Infante. He has had some relief of his sx. Now on higher dose of gabapentin. He has gone to PT. He has had Nerve conduction testing by Dr. Nelva Bush. He does feel he is starting to improve. .Past treatments include angiotensin blockers. The current treatment provides moderate improvement. There are no compliance problems.   Hyperlipidemia Pertinent negatives include no chest pain or shortness of breath.     Past Medical History:  Diagnosis Date   History of cocaine abuse ([place])    Hypertension    Osteoarthritis      Family History  Problem Relation Age of Onset   Stroke Mother 11   Hypertension Mother    Healthy Father      Current Outpatient Medications:    atorvastatin (LIPITOR) 40 MG tablet, TAKE 1 TABLET DAILY. MON-SAT SKIP SUNDAYS, Disp: 30 tablet, Rfl: 11   gabapentin (NEURONTIN) 300 MG capsule, TAKE 1 CAPSULE BY MOUTH EVERYDAY AT BEDTIME, Disp: 90 capsule, Rfl: 1   magnesium gluconate (MAGONATE) 500 MG tablet, Take 500 mg by mouth daily., Disp: ,  Rfl:    rivaroxaban (XARELTO) 10 MG TABS tablet, Take 1 tablet (10 mg total) by mouth daily., Disp: 90 tablet, Rfl: 1   telmisartan (MICARDIS) 20 MG tablet, Take 1 tablet (20 mg total) by mouth daily., Disp: 90 tablet, Rfl: 1   VITAMIN D, CHOLECALCIFEROL, PO, Take by mouth. 2 times per day, Disp: , Rfl:    No Known Allergies   Review of Systems  Constitutional: Negative.   HENT: Negative.    Eyes:  Negative for blurred vision.  Respiratory:  Negative for shortness of breath.   Cardiovascular: Negative.  Negative for chest pain.  Genitourinary: Negative.   Musculoskeletal:  Neck pain: he has been eval by Dr. Rolena Infante. He has had some relief of his sx. Now on higher dose of gabapentin. He has gone to PT. He has had Nerve conduction testing by Dr. Nelva Bush. He does feel he is starting to improve. .  Skin: Negative.   Allergic/Immunologic: Negative.   Neurological:  Negative for headaches.  Hematological: Negative.   Psychiatric/Behavioral: Negative.       Today's Vitals   12/28/22 1510  BP: 122/78  Pulse: 75  Temp: 98 F (36.7 C)  SpO2: 98%  Weight: 234 lb (106.1 kg)  Height: 6\' 2"  (1.88 m)   Body mass index is 30.04 kg/m.  Wt Readings from Last 3 Encounters:  12/28/22 234 lb (106.1 kg)  09/20/22 227 lb 12.8 oz (103.3 kg)  08/03/22 230 lb 9.6 oz (104.6 kg)    Objective:  Physical Exam Vitals and nursing note reviewed.  Constitutional:      Appearance: Normal appearance.  HENT:     Head: Normocephalic and atraumatic.     Nose:     Comments: MASKED     Mouth/Throat:     Comments: MASKED  Eyes:     Extraocular Movements: Extraocular movements intact.  Cardiovascular:     Rate and Rhythm: Normal rate and regular rhythm.     Heart sounds: Normal heart sounds.  Pulmonary:     Effort: Pulmonary effort is normal.     Breath sounds: Normal breath sounds.  Musculoskeletal:     Cervical back: Normal range of motion.  Skin:    General: Skin is warm.  Neurological:      General: No focal deficit present.     Mental Status: He is alert.  Psychiatric:        Mood and Affect: Mood normal.       Assessment And Plan:     1. Hypertensive heart disease without heart failure Comments: Chronic, he will c/w telmisartan 20mg  daily. He is encouraged to follow low sodium diet.  He will f/u in 4-6 months. - CMP14+EGFR  2. Coronary artery disease of native artery of native heart with stable angina pectoris [facility]) Comments: Chronic, he is followed by Cardiology. Diagnosed w/ cardiac CTA. Encouraged to follow heart healthy lifestyle. - Lipid panel  3. Pure hypercholesterolemia Comments: Chronic, currently on atorvastatin 40mg  daily. - CMP14+EGFR - Lipid panel - TSH  4. Vitamin D deficiency disease Comments: I will check a vitamin D level and supplement as needed. - Vitamin D (25 hydroxy)  5. Acquired thrombophilia ([place]) Comments: He is currently on Xarelto due to h/o b/l PE and DVT.  6. Class 1 obesity due to excess calories without serious comorbidity with body mass index (BMI) of 30.0 to 30.9 in adult Comments: HIs BMI is acceptable for his demographic. Encouraged to continue with his regular exercise regimen.   Patient was given opportunity to ask questions. Patient verbalized understanding of the plan and was able to repeat key elements of the plan. All questions were answered to their satisfaction.   I, [name], MD, have reviewed all documentation for this visit. The documentation on 12/28/22 for the exam, diagnosis, procedures, and orders are all accurate and complete.   IF YOU HAVE BEEN REFERRED TO A SPECIALIST, IT MAY TAKE 1-2 WEEKS TO SCHEDULE/PROCESS THE REFERRAL. IF YOU HAVE NOT HEARD FROM US/SPECIALIST IN TWO WEEKS, PLEASE GIVE [place] A CALL AT [phone].   THE PATIENT IS ENCOURAGED TO PRACTICE SOCIAL DISTANCING DUE TO THE COVID-19 PANDEMIC.

## 2022-12-28 NOTE — Patient Instructions (Signed)
Hypertension, Adult Hypertension is another name for high blood pressure. High blood pressure forces your heart to work harder to pump blood. This can cause problems over time. There are two numbers in a blood pressure reading. There is a top number (systolic) over a bottom number (diastolic). It is best to have a blood pressure that is below 120/80. What are the causes? The cause of this condition is not known. Some other conditions can lead to high blood pressure. What increases the risk? Some lifestyle factors can make you more likely to develop high blood pressure: Smoking. Not getting enough exercise or physical activity. Being overweight. Having too much fat, sugar, calories, or salt (sodium) in your diet. Drinking too much alcohol. Other risk factors include: Having any of these conditions: Heart disease. Diabetes. High cholesterol. Kidney disease. Obstructive sleep apnea. Having a family history of high blood pressure and high cholesterol. Age. The risk increases with age. Stress. What are the signs or symptoms? High blood pressure may not cause symptoms. Very high blood pressure (hypertensive crisis) may cause: Headache. Fast or uneven heartbeats (palpitations). Shortness of breath. Nosebleed. Vomiting or feeling like you may vomit (nauseous). Changes in how you see. Very bad chest pain. Feeling dizzy. Seizures. How is this treated? This condition is treated by making healthy lifestyle changes, such as: Eating healthy foods. Exercising more. Drinking less alcohol. Your doctor may prescribe medicine if lifestyle changes do not help enough and if: Your top number is above 130. Your bottom number is above 80. Your personal target blood pressure may vary. Follow these instructions at home: Eating and drinking  If told, follow the DASH eating plan. To follow this plan: Fill one half of your plate at each meal with fruits and vegetables. Fill one fourth of your plate  at each meal with whole grains. Whole grains include whole-wheat pasta, brown rice, and whole-grain bread. Eat or drink low-fat dairy products, such as skim milk or low-fat yogurt. Fill one fourth of your plate at each meal with low-fat (lean) proteins. Low-fat proteins include fish, chicken without skin, eggs, beans, and tofu. Avoid fatty meat, cured and processed meat, or chicken with skin. Avoid pre-made or processed food. Limit the amount of salt in your diet to less than 1,500 mg each day. Do not drink alcohol if: Your doctor tells you not to drink. You are pregnant, may be pregnant, or are planning to become pregnant. If you drink alcohol: Limit how much you have to: 0-1 drink a day for women. 0-2 drinks a day for men. Know how much alcohol is in your drink. In the [place] one drink equals one 12 oz bottle of beer (355 mL), one 5 oz glass of wine (148 mL), or one 1 oz glass of hard liquor (44 mL). Lifestyle  Work with your doctor to stay at a healthy weight or to lose weight. Ask your doctor what the best weight is for you. Get at least 30 minutes of exercise that causes your heart to beat faster (aerobic exercise) most days of the week. This may include walking, swimming, or biking. Get at least 30 minutes of exercise that strengthens your muscles (resistance exercise) at least 3 days a week. This may include lifting weights or doing Pilates. Do not smoke or use any products that contain nicotine or tobacco. If you need help quitting, ask your doctor. Check your blood pressure at home as told by your doctor. Keep all follow-up visits. Medicines Take over-the-counter and prescription medicines  only as told by your doctor. Follow directions carefully. ?Do not skip doses of blood pressure medicine. The medicine does not work as well if you skip doses. Skipping doses also puts you at risk for problems. ?Ask your doctor about side effects or reactions to medicines that you should watch  for. ?Contact a doctor if: ?You think you are having a reaction to the medicine you are taking. ?You have headaches that keep coming back. ?You feel dizzy. ?You have swelling in your ankles. ?You have trouble with your vision. ?Get help right away if: ?You get a very bad headache. ?You start to feel mixed up (confused). ?You feel weak or numb. ?You feel faint. ?You have very bad pain in your: ?Chest. ?Belly (abdomen). ?You vomit more than once. ?You have trouble breathing. ?These symptoms may be an emergency. Get help right away. Call 911. ?Do not wait to see if the symptoms will go away. ?Do not drive yourself to the hospital. ?Summary ?Hypertension is another name for high blood pressure. ?High blood pressure forces your heart to work harder to pump blood. ?For most people, a normal blood pressure is less than 120/80. ?Making healthy choices can help lower blood pressure. If your blood pressure does not get lower with healthy choices, you may need to take medicine. ?This information is not intended to replace advice given to you by your health care provider. Make sure you discuss any questions you have with your health care provider. ?Document Revised: 09/01/2021 Document Reviewed: 09/01/2021 ?Elsevier Patient Education ? 2023 [employer] Inc. ? ?

## 2022-12-29 LAB — CMP14+EGFR
ALT: 23 IU/L (ref 0–44)
AST: 35 IU/L (ref 0–40)
Albumin/Globulin Ratio: 1.4 (ref 1.2–2.2)
Albumin: 4.2 g/dL (ref 3.8–4.8)
Alkaline Phosphatase: 73 IU/L (ref 44–121)
BUN/Creatinine Ratio: 21 (ref 10–24)
BUN: 18 mg/dL (ref 8–27)
Bilirubin Total: 0.4 mg/dL (ref 0.0–1.2)
CO2: 23 mmol/L (ref 20–29)
Calcium: 9.6 mg/dL (ref 8.6–10.2)
Chloride: 100 mmol/L (ref 96–106)
Creatinine, Ser: 0.86 mg/dL (ref 0.76–1.27)
Globulin, Total: 3 g/dL (ref 1.5–4.5)
Glucose: 74 mg/dL (ref 70–99)
Potassium: 4.5 mmol/L (ref 3.5–5.2)
Sodium: 137 mmol/L (ref 134–144)
Total Protein: 7.2 g/dL (ref 6.0–8.5)
eGFR: 93 mL/min/{1.73_m2} (ref >59)

## 2022-12-29 LAB — LIPID PANEL
Chol/HDL Ratio: 2.6 ratio (ref 0.0–5.0)
Cholesterol, Total: 186 mg/dL (ref 100–199)
HDL: 71 mg/dL (ref >39)
LDL Chol Calc (NIH): 102 mg/dL — ABNORMAL HIGH (ref 0–99)
Triglycerides: 67 mg/dL (ref 0–149)
VLDL Cholesterol Cal: 13 mg/dL (ref 5–40)

## 2022-12-29 LAB — TSH: TSH: 3.19 u[IU]/mL (ref 0.450–4.500)

## 2022-12-29 LAB — VITAMIN D 25 HYDROXY (VIT D DEFICIENCY, FRACTURES): Vit D, 25-Hydroxy: 105 ng/mL — ABNORMAL HIGH (ref 30.0–100.0)

## 2023-01-26 ENCOUNTER — Inpatient Hospital Stay (HOSPITAL_BASED_OUTPATIENT_CLINIC_OR_DEPARTMENT_OTHER): Payer: PPO | Admitting: Hematology and Oncology

## 2023-01-26 ENCOUNTER — Other Ambulatory Visit: Payer: Self-pay | Admitting: Hematology and Oncology

## 2023-01-26 ENCOUNTER — Inpatient Hospital Stay: Payer: PPO | Attending: Hematology and Oncology

## 2023-01-26 VITALS — BP 134/91 | HR 65 | Temp 97.7°F | Resp 17 | Wt 231.8 lb

## 2023-01-26 DIAGNOSIS — I2699 Other pulmonary embolism without acute cor pulmonale: Secondary | ICD-10-CM

## 2023-01-26 DIAGNOSIS — Z7901 Long term (current) use of anticoagulants: Secondary | ICD-10-CM | POA: Insufficient documentation

## 2023-01-26 DIAGNOSIS — Z86711 Personal history of pulmonary embolism: Secondary | ICD-10-CM | POA: Diagnosis not present

## 2023-01-26 DIAGNOSIS — Z86718 Personal history of other venous thrombosis and embolism: Secondary | ICD-10-CM | POA: Diagnosis not present

## 2023-01-26 LAB — CMP (CANCER CENTER ONLY)
ALT: 43 U/L (ref 0–44)
AST: 75 U/L — ABNORMAL HIGH (ref 15–41)
Albumin: 3.4 g/dL — ABNORMAL LOW (ref 3.5–5.0)
Alkaline Phosphatase: 58 U/L (ref 38–126)
Anion gap: 6 (ref 5–15)
BUN: 20 mg/dL (ref 8–23)
CO2: 28 mmol/L (ref 22–32)
Calcium: 9.2 mg/dL (ref 8.9–10.3)
Chloride: 103 mmol/L (ref 98–111)
Creatinine: 1 mg/dL (ref 0.61–1.24)
GFR, Estimated: >60 mL/min (ref >60)
Glucose, Bld: 86 mg/dL (ref 70–99)
Potassium: 4.5 mmol/L (ref 3.5–5.1)
Sodium: 137 mmol/L (ref 135–145)
Total Bilirubin: 0.6 mg/dL (ref 0.3–1.2)
Total Protein: 7.3 g/dL (ref 6.5–8.1)

## 2023-01-26 LAB — CBC WITH DIFFERENTIAL (CANCER CENTER ONLY)
Abs Immature Granulocytes: 0.01 10*3/uL (ref 0.00–0.07)
Basophils Absolute: 0 10*3/uL (ref 0.0–0.1)
Basophils Relative: 0 %
Eosinophils Absolute: 0.1 10*3/uL (ref 0.0–0.5)
Eosinophils Relative: 3 %
HCT: 41.6 % (ref 39.0–52.0)
Hemoglobin: 14.1 g/dL (ref 13.0–17.0)
Immature Granulocytes: 0 %
Lymphocytes Relative: 28 %
Lymphs Abs: 1.6 10*3/uL (ref 0.7–4.0)
MCH: 29.2 pg (ref 26.0–34.0)
MCHC: 33.9 g/dL (ref 30.0–36.0)
MCV: 86.1 fL (ref 80.0–100.0)
Monocytes Absolute: 0.5 10*3/uL (ref 0.1–1.0)
Monocytes Relative: 9 %
Neutro Abs: 3.3 10*3/uL (ref 1.7–7.7)
Neutrophils Relative %: 60 %
Platelet Count: 168 10*3/uL (ref 150–400)
RBC: 4.83 MIL/uL (ref 4.22–5.81)
RDW: 12.8 % (ref 11.5–15.5)
WBC Count: 5.6 10*3/uL (ref 4.0–10.5)
nRBC: 0 % (ref 0.0–0.2)

## 2023-01-26 NOTE — Progress Notes (Signed)
[place] [phone]   [phone]  PROGRESS NOTE  Patient Care Team: Glendale Chard, MD as PCP - General (Internal Medicine) Josue Hector, MD as PCP - Cardiology (Cardiology)  Hematological/Oncological History # Bilateral Pulmonary Emboli # Bilateral Lower Extremity DVTs 06/24/2021: Patient underwent a CT coronary morphology with CTA and was incidentally found to have filling defects in the pulmonary arteries bilaterally consistent with pulmonary emboli.  Patient started on Xarelto therapy. 06/30/2021: Ultrasound of lower extremities bilaterally showed on the right side acute deep vein thrombosis involving the popliteal, right femoral vein as well as on the left side acute deep vein thrombosis involving the left femoral vein, left popliteal vein, and left peroneal veins 01/13/2022: Repeat lower extremity ultrasound showed recannulized thrombus in the left popliteal veins findings consistent with chronic deep vein thrombosis. 01/26/2022: Establish care with Dr. Lorenso Courier.  Interval History:  Marc [name] 72 y.o. male with medical history significant for bilateral pulmonary emboli and DVTs who presents for a follow up visit. The patient's last visit was on 07/28/2022. In the interim since the last visit he has continued on Xarelto therapy.  On exam today Marc [name] notes he has been "fine" in the interim since her last visit.  He is had no major changes in his health though he is "a year older".  He notes that he has not had any changes in his medications or hospitalizations.  He reports that he is tolerating Xarelto 10 mg p.o. daily well and is not having any issues with nosebleeds, gum bleeding, or dark stools.  He reports that the cost of the medication is okay and he is unsure what he is paying on a monthly basis for it.  He is not having any signs or symptoms concerning for recurrent VTE.  He is not having any chest pain, leg swelling, leg pain.  He reports that  everything has been within the ordinary.  He is in good shape and working out approximately 5 to 6 days/week.  He does have chronic swelling of his left lower extremity from his "6 knee operations".  He otherwise denies any fevers, chills, sweats, nausea, vomiting or diarrhea.  Full 10 point ROS is listed below.  MEDICAL HISTORY:  Past Medical History:  Diagnosis Date   History of cocaine abuse ([place])    Hypertension    Osteoarthritis     SURGICAL HISTORY: Past Surgical History:  Procedure Laterality Date   KNEE ARTHROPLASTY Left 06/21/11   Gaynelle Arabian, MD    SOCIAL HISTORY: Social History   Socioeconomic History   Marital status: Single    Spouse name: Not on file   Number of children: 0   Years of education: [employer]   Highest education level: Not on file  Occupational History   Occupation: Community education officer: UNEMPLOYED    Comment: since 2009   Occupation: retired  Tobacco Use   Smoking status: Never   Smokeless tobacco: Never  Vaping Use   Vaping Use: Never used  Substance and Sexual Activity   Alcohol use: Not Currently    Comment: beer a week maybe   Drug use: Not Currently    Comment: Last in Nov 2013   Sexual activity: Not Currently  Other Topics Concern   Not on file  Social History Narrative   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)   Social Determinants of Health   Financial Resource Strain: Low Risk  (08/03/2022)   Overall Financial Resource  Strain (CARDIA)    Difficulty of Paying Living Expenses: Not hard at all  Food Insecurity: No Food Insecurity (08/03/2022)   Hunger Vital Sign    Worried About Running Out of Food in the Last Year: Never true    Ran Out of Food in the Last Year: Never true  Transportation Needs: No Transportation Needs (08/03/2022)   PRAPARE - Hydrologist (Medical): No    Lack of Transportation (Non-Medical): No  Physical Activity: Sufficiently Active (08/03/2022)   Exercise Vital Sign    Days of  Exercise per Week: 5 days    Minutes of Exercise per Session: 120 min  Stress: No Stress Concern Present (07/07/2021)   [place]    Feeling of Stress : Not at all  Social Connections: Not on file  Intimate Partner Violence: Not At Risk (06/04/2019)   Humiliation, Afraid, Rape, and Kick questionnaire    Fear of Current or Ex-Partner: No    Emotionally Abused: No    Physically Abused: No    Sexually Abused: No    FAMILY HISTORY: Family History  Problem Relation Age of Onset   Stroke Mother 2   Hypertension Mother    Healthy Father     ALLERGIES:  has No Known Allergies.  MEDICATIONS:  Current Outpatient Medications  Medication Sig Dispense Refill   atorvastatin (LIPITOR) 40 MG tablet TAKE 1 TABLET DAILY. MON-SAT SKIP SUNDAYS 30 tablet 11   gabapentin (NEURONTIN) 300 MG capsule TAKE 1 CAPSULE BY MOUTH EVERYDAY AT BEDTIME 90 capsule 1   magnesium gluconate (MAGONATE) 500 MG tablet Take 500 mg by mouth daily.     rivaroxaban (XARELTO) 10 MG TABS tablet Take 1 tablet (10 mg total) by mouth daily. 90 tablet 1   telmisartan (MICARDIS) 20 MG tablet Take 1 tablet (20 mg total) by mouth daily. 90 tablet 1   VITAMIN D, CHOLECALCIFEROL, PO Take by mouth. 2 times per day     No current facility-administered medications for this visit.    REVIEW OF SYSTEMS:   Constitutional: ( - ) fevers, ( - )  chills , ( - ) night sweats Eyes: ( - ) blurriness of vision, ( - ) double vision, ( - ) watery eyes Ears, nose, mouth, throat, and face: ( - ) mucositis, ( - ) sore throat Respiratory: ( - ) cough, ( - ) dyspnea, ( - ) wheezes Cardiovascular: ( - ) palpitation, ( - ) chest discomfort, ( - ) lower extremity swelling Gastrointestinal:  ( - ) nausea, ( - ) heartburn, ( - ) change in bowel habits Skin: ( - ) abnormal skin rashes Lymphatics: ( - ) new lymphadenopathy, ( - ) easy bruising Neurological: ( - ) numbness, ( - ) tingling,  ( - ) new weaknesses Behavioral/Psych: ( - ) mood change, ( - ) new changes  All other systems were reviewed with the patient and are negative.  PHYSICAL EXAMINATION:  Vitals:   01/26/23 1142  BP: (!) 134/91  Pulse: 65  Resp: 17  Temp: 97.7 F (36.5 C)  SpO2: 100%   Filed Weights   01/26/23 1142  Weight: 231 lb 12.8 oz (105.1 kg)    GENERAL: Well-appearing elderly African-American male, alert, no distress and comfortable SKIN: skin color, texture, turgor are normal, no rashes or significant lesions EYES: conjunctiva are pink and non-injected, sclera clear LUNGS: clear to auscultation and percussion with normal breathing effort HEART: regular  rate & rhythm and no murmurs and no lower extremity edema on right leg, chronic swelling of left leg.  Musculoskeletal: no cyanosis of digits and no clubbing  PSYCH: alert & oriented x 3, fluent speech NEURO: no focal motor/sensory deficits  LABORATORY DATA:  I have reviewed the data as listed    Latest Ref Rng & Units 01/26/2023   11:07 AM 07/28/2022    1:41 PM 06/27/2022    3:53 PM  CBC  WBC 4.0 - 10.5 K/uL 5.6  5.2  4.6   Hemoglobin 13.0 - 17.0 g/dL 14.1  14.0  15.1   Hematocrit 39.0 - 52.0 % 41.6  41.0  46.0   Platelets 150 - 400 K/uL 168  167  183        Latest Ref Rng & Units 01/26/2023   11:07 AM 12/28/2022    3:46 PM 09/20/2022    4:58 PM  CMP  Glucose 70 - 99 mg/dL 86  74    BUN 8 - 23 mg/dL 20  18    Creatinine 0.61 - 1.24 mg/dL 1.00  0.86    Sodium 135 - 145 mmol/L 137  137    Potassium 3.5 - 5.1 mmol/L 4.5  4.5    Chloride 98 - 111 mmol/L 103  100    CO2 22 - 32 mmol/L 28  23    Calcium 8.9 - 10.3 mg/dL 9.2  9.6    Total Protein 6.5 - 8.1 g/dL 7.3  7.2    Total Bilirubin 0.3 - 1.2 mg/dL 0.6  0.4    Alkaline Phos 38 - 126 U/L 58  73    AST 15 - 41 U/L 75  35    ALT 0 - 44 U/L 43  23  32     RADIOGRAPHIC STUDIES: No results found.  ASSESSMENT & PLAN Marc [name] 72 y.o. male with medical history significant for  bilateral pulmonary emboli and DVTs who presents for a follow up visit.  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   A provoked venous thromboembolism (VTE) is one that has a clear inciting factor or event. Provoking factors include prolonged travel/immobility, surgery (particular abdominal or orthropedic), trauma,  and pregnancy/ estrogen containing birth control. After a detailed history and review of the records there is no clear provoking factor for this patient's VTE.  Patients with unprovoked VTEs have up to 25% recurrence after 5 years and 36% at 10 years, with 4% of these clots being fatal (BMJ?2019;366:l4363). Therefore the formal recommendation for unprovoked VTE's is lifelong anticoagulation, as the cause may not be transient or reversible. We recommend 6 months or full strength anticoagulation with a re-evaluation after that time.  The patient's will then have a choice of maintenance dose DOAC (preferred, recommended), '81mg'$  ASA PO daily (non-preferred), or no further anticoagulation (not recommended).     # Unprovoked DVT/Pulmonary Embolism  --findings at this time are consistent with a unprovoked VTE  --will order CMP and CBC to assure labs are adequate for DOAC therapy  --ruled out APS with anticardiolipin and anti beta2 glycoprotein antibodies.  Lupus anticoagulant panel would be altered by presence of blood thinner, will hold on this testing.   --recommend the patient continue Xarelto 10 mg p.o. daily for maintenance therapy. --patient denies any bleeding, bruising, or dark stools on this medication. It is well tolerated. No difficulties accessing/affording  the medication  --Labs today show white blood cell count 5.6, hemoglobin 14.1, MCV 86.1, and platelets of 168 --RTC in 6 months' time with strict return precautions for overt signs of bleeding.     No orders of the defined types were placed in this encounter.   All questions were answered. The patient knows to call the clinic with any problems, questions or concerns.  A total of more than 25 minutes were spent on this encounter with face-to-face time and non-face-to-face time, including preparing to see the patient, ordering tests and/or medications, counseling the patient and coordination of care as outlined above.   Ledell Peoples, MD Department of Hematology/Oncology [place] [facility] Phone: [phone] Pager: [phone] Email: [email or web address]  01/27/2023 6:32 PM

## 2023-01-29 ENCOUNTER — Telehealth: Payer: Self-pay | Admitting: Hematology and Oncology

## 2023-01-29 NOTE — Telephone Encounter (Signed)
Called patient per 3/1 los notes to schedule f/u. Left voicemail with new appointment information and contact details if needing to reschedule.

## 2023-02-21 ENCOUNTER — Other Ambulatory Visit: Payer: Self-pay | Admitting: Internal Medicine

## 2023-04-05 ENCOUNTER — Other Ambulatory Visit: Payer: Self-pay | Admitting: Hematology and Oncology

## 2023-04-05 DIAGNOSIS — M25552 Pain in left hip: Secondary | ICD-10-CM | POA: Diagnosis not present

## 2023-04-05 DIAGNOSIS — Z96652 Presence of left artificial knee joint: Secondary | ICD-10-CM | POA: Diagnosis not present

## 2023-04-12 ENCOUNTER — Other Ambulatory Visit: Payer: Self-pay | Admitting: Internal Medicine

## 2023-04-12 MED ORDER — GABAPENTIN 300 MG PO CAPS: ORAL_CAPSULE | ORAL | 1 refills | Status: DC

## 2023-04-26 ENCOUNTER — Other Ambulatory Visit: Payer: Self-pay | Admitting: Internal Medicine

## 2023-04-26 ENCOUNTER — Encounter: Payer: Self-pay | Admitting: Internal Medicine

## 2023-04-26 MED ORDER — TRIAMCINOLONE ACETONIDE 0.1 % EX CREA: TOPICAL_CREAM | CUTANEOUS | 0 refills | Status: DC

## 2023-04-30 ENCOUNTER — Other Ambulatory Visit: Payer: Self-pay | Admitting: Internal Medicine

## 2023-07-02 ENCOUNTER — Encounter: Payer: Self-pay | Admitting: Internal Medicine

## 2023-07-02 ENCOUNTER — Ambulatory Visit (INDEPENDENT_AMBULATORY_CARE_PROVIDER_SITE_OTHER): Payer: PPO | Admitting: Internal Medicine

## 2023-07-02 VITALS — BP 122/84 | HR 77 | Temp 97.9°F | Ht 74.0 in | Wt 225.2 lb

## 2023-07-02 DIAGNOSIS — E7800 Pure hypercholesterolemia, unspecified: Secondary | ICD-10-CM

## 2023-07-02 DIAGNOSIS — I119 Hypertensive heart disease without heart failure: Secondary | ICD-10-CM | POA: Diagnosis not present

## 2023-07-02 DIAGNOSIS — Z0000 Encounter for general adult medical examination without abnormal findings: Secondary | ICD-10-CM | POA: Insufficient documentation

## 2023-07-02 DIAGNOSIS — E663 Overweight: Secondary | ICD-10-CM | POA: Diagnosis not present

## 2023-07-02 DIAGNOSIS — I25118 Atherosclerotic heart disease of native coronary artery with other forms of angina pectoris: Secondary | ICD-10-CM

## 2023-07-02 DIAGNOSIS — Z6828 Body mass index (BMI) 28.0-28.9, adult: Secondary | ICD-10-CM

## 2023-07-02 LAB — POCT URINALYSIS DIPSTICK
Bilirubin, UA: NEGATIVE
Blood, UA: NEGATIVE
Glucose, UA: NEGATIVE
Ketones, UA: NEGATIVE
Leukocytes, UA: NEGATIVE
Nitrite, UA: NEGATIVE
Protein, UA: NEGATIVE
Spec Grav, UA: 1.015 (ref 1.010–1.025)
Urobilinogen, UA: 0.2 E.U./dL
pH, UA: 7 (ref 5.0–8.0)

## 2023-07-02 NOTE — Assessment & Plan Note (Addendum)
Chronic, fair control. Goal BP<120/80.  EKG performed, NSR w/ first AV block, anterolateral ST-elevation/repolarization variant.  He is reminded to follow low sodium diet. He will continue with telmisartan 20mg  daily. NO med changes today. He will f/u in 4-6 months for re-evaluation.

## 2023-07-02 NOTE — Assessment & Plan Note (Addendum)
Chronic, LDL goal<70.  He will continue with atorvastatin 40mg  Sun-Friday, skipping Saturdays for now. He never increased the dose earlier this year. He agrees to rto in six weeks for fasting lipid panel(he is not fasting today).  He is encouraged to follow heart healthy lifestyle.

## 2023-07-02 NOTE — Assessment & Plan Note (Signed)
He is encouraged to aim for at least 150 minutes of exercise/week.

## 2023-07-02 NOTE — Patient Instructions (Signed)
Health Maintenance, Male Adopting a healthy lifestyle and getting preventive care are important in promoting health and wellness. Ask your health care provider about: The right schedule for you to have regular tests and exams. Things you can do on your own to prevent diseases and keep yourself healthy. What should I know about diet, weight, and exercise? Eat a healthy diet  Eat a diet that includes plenty of vegetables, fruits, low-fat dairy products, and lean protein. Do not eat a lot of foods that are high in solid fats, added sugars, or sodium. Maintain a healthy weight Body mass index (BMI) is a measurement that can be used to identify possible weight problems. It estimates body fat based on height and weight. Your health care provider can help determine your BMI and help you achieve or maintain a healthy weight. Get regular exercise Get regular exercise. This is one of the most important things you can do for your health. Most adults should: Exercise for at least 150 minutes each week. The exercise should increase your heart rate and make you sweat (moderate-intensity exercise). Do strengthening exercises at least twice a week. This is in addition to the moderate-intensity exercise. Spend less time sitting. Even light physical activity can be beneficial. Watch cholesterol and blood lipids Have your blood tested for lipids and cholesterol at 72 years of age, then have this test every 5 years. You may need to have your cholesterol levels checked more often if: Your lipid or cholesterol levels are high. You are older than 72 years of age. You are at high risk for heart disease. What should I know about cancer screening? Many types of cancers can be detected early and may often be prevented. Depending on your health history and family history, you may need to have cancer screening at various ages. This may include screening for: Colorectal cancer. Prostate cancer. Skin cancer. Lung  cancer. What should I know about heart disease, diabetes, and high blood pressure? Blood pressure and heart disease High blood pressure causes heart disease and increases the risk of stroke. This is more likely to develop in people who have high blood pressure readings or are overweight. Talk with your health care provider about your target blood pressure readings. Have your blood pressure checked: Every 3-5 years if you are 18-39 years of age. Every year if you are 40 years old or older. If you are between the ages of 65 and 75 and are a current or former smoker, ask your health care provider if you should have a one-time screening for abdominal aortic aneurysm (AAA). Diabetes Have regular diabetes screenings. This checks your fasting blood sugar level. Have the screening done: Once every three years after age 45 if you are at a normal weight and have a low risk for diabetes. More often and at a younger age if you are overweight or have a high risk for diabetes. What should I know about preventing infection? Hepatitis B If you have a higher risk for hepatitis B, you should be screened for this virus. Talk with your health care provider to find out if you are at risk for hepatitis B infection. Hepatitis C Blood testing is recommended for: Everyone born from 1945 through 1965. Anyone with known risk factors for hepatitis C. Sexually transmitted infections (STIs) You should be screened each year for STIs, including gonorrhea and chlamydia, if: You are sexually active and are younger than 72 years of age. You are older than 72 years of age and your   health care provider tells you that you are at risk for this type of infection. Your sexual activity has changed since you were last screened, and you are at increased risk for chlamydia or gonorrhea. Ask your health care provider if you are at risk. Ask your health care provider about whether you are at high risk for HIV. Your health care provider  may recommend a prescription medicine to help prevent HIV infection. If you choose to take medicine to prevent HIV, you should first get tested for HIV. You should then be tested every 3 months for as long as you are taking the medicine. Follow these instructions at home: Alcohol use Do not drink alcohol if your health care provider tells you not to drink. If you drink alcohol: Limit how much you have to 0-2 drinks a day. Know how much alcohol is in your drink. In the [place] one drink equals one 12 oz bottle of beer (355 mL), one 5 oz glass of wine (148 mL), or one 1 oz glass of hard liquor (44 mL). Lifestyle Do not use any products that contain nicotine or tobacco. These products include cigarettes, chewing tobacco, and vaping devices, such as e-cigarettes. If you need help quitting, ask your health care provider. Do not use street drugs. Do not share needles. Ask your health care provider for help if you need support or information about quitting drugs. General instructions Schedule regular health, dental, and eye exams. Stay current with your vaccines. Tell your health care provider if: You often feel depressed. You have ever been abused or do not feel safe at home. Summary Adopting a healthy lifestyle and getting preventive care are important in promoting health and wellness. Follow your health care provider's instructions about healthy diet, exercising, and getting tested or screened for diseases. Follow your health care provider's instructions on monitoring your cholesterol and blood pressure. This information is not intended to replace advice given to you by your health care provider. Make sure you discuss any questions you have with your health care provider. Document Revised: 04/04/2021 Document Reviewed: 04/04/2021 Elsevier Patient Education  2024 Elsevier Inc.  

## 2023-07-02 NOTE — Assessment & Plan Note (Signed)
A full exam was performed. DRE deferred, he is also followed by Urology. PATIENT IS ADVISED TO GET 30-45 MINUTES REGULAR EXERCISE NO LESS THAN FOUR TO FIVE DAYS PER WEEK - BOTH WEIGHTBEARING EXERCISES AND AEROBIC ARE RECOMMENDED.  PATIENT IS ADVISED TO FOLLOW A HEALTHY DIET WITH AT LEAST SIX FRUITS/VEGGIES PER DAY, DECREASE INTAKE OF RED MEAT, AND TO INCREASE FISH INTAKE TO TWO DAYS PER WEEK.  MEATS/FISH SHOULD NOT BE FRIED, BAKED OR BROILED IS PREFERABLE.  IT IS ALSO IMPORTANT TO CUT BACK ON YOUR SUGAR INTAKE. PLEASE AVOID ANYTHING WITH ADDED SUGAR, CORN SYRUP OR OTHER SWEETENERS. IF YOU MUST USE A SWEETENER, YOU CAN TRY STEVIA. IT IS ALSO IMPORTANT TO AVOID ARTIFICIALLY SWEETENERS AND DIET BEVERAGES. LASTLY, I SUGGEST WEARING SPF 50 SUNSCREEN ON EXPOSED PARTS AND ESPECIALLY WHEN IN THE DIRECT SUNLIGHT FOR AN EXTENDED PERIOD OF TIME.  PLEASE AVOID FAST FOOD RESTAURANTS AND INCREASE YOUR WATER INTAKE.

## 2023-07-02 NOTE — Assessment & Plan Note (Signed)
Chronic, encouraged  to follow heart healthy lifestyle. He will continue with atorvastatin 40mg  6 days weekly.

## 2023-07-02 NOTE — Progress Notes (Signed)
I,Marc [name], CMA,acting as a Neurosurgeon for Gwynneth Aliment, MD.,have documented all relevant documentation on the behalf of Gwynneth Aliment, MD,as directed by  Gwynneth Aliment, MD while in the presence of Gwynneth Aliment, MD.  Subjective:    Patient ID: Marc [name] , male    DOB: May 17, 1951 , 72 y.o.   MRN: [id number]  Chief Complaint  Patient presents with   Annual Exam   Hypertension   Hyperlipidemia    HPI  He presents today for a full physical exam.  He reports compliance with meds. He denies headaches, chest pain and shortness of breath.   He wants referral to an eye doctor. He has been referred in the past to Dr. Harlon Flor; however, he has never received a call back.   Prostate exams he reports completing with Urologist.   Hypertension This is a chronic problem. The current episode started more than 1 year ago. The problem has been gradually improving since onset. The problem is controlled. Pertinent negatives include no blurred vision, headaches or shortness of breath. Past treatments include angiotensin blockers. The current treatment provides moderate improvement. Compliance problems include diet.      Past Medical History:  Diagnosis Date   History of cocaine abuse (HCC)    Hypertension    Osteoarthritis      Family History  Problem Relation Age of Onset   Stroke Mother 67   Hypertension Mother    Healthy Father      Current Outpatient Medications:    atorvastatin (LIPITOR) 40 MG tablet, TAKE 1 TABLET DAILY. MON-SAT SKIP SUNDAYS, Disp: 30 tablet, Rfl: 11   gabapentin (NEURONTIN) 300 MG capsule, TAKE 1 CAPSULE BY MOUTH EVERYDAY AT BEDTIME, Disp: 90 capsule, Rfl: 1   magnesium gluconate (MAGONATE) 500 MG tablet, Take 500 mg by mouth daily., Disp: , Rfl:    telmisartan (MICARDIS) 20 MG tablet, TAKE 1 TABLET BY MOUTH EVERY DAY, Disp: 90 tablet, Rfl: 1   triamcinolone cream (KENALOG) 0.1 %, APPLY TO AFFECTED AREA TWICE DAILY AS NEEDED, Disp: 45 g, Rfl: 0    VITAMIN D, CHOLECALCIFEROL, PO, Take by mouth. 2 times per day, Disp: , Rfl:    XARELTO 10 MG TABS tablet, TAKE 1 TABLET BY MOUTH EVERY DAY, Disp: 30 tablet, Rfl: 5   No Known Allergies    Men's preventive visit. Patient Health Questionnaire (PHQ-2) is  Flowsheet Row Office Visit from 07/02/2023 in [facility]  PHQ-2 Total Score 0     . Patient is on a healthy diet. Marital status: Single. Relevant history for alcohol use is:  Social History   Substance and Sexual Activity  Alcohol Use Not Currently   Comment: beer a week maybe  . Relevant history for tobacco use is:  Social History   Tobacco Use  Smoking Status Never  Smokeless Tobacco Never  .    Review of Systems  Constitutional: Negative.   HENT: Negative.    Eyes: Negative.  Negative for blurred vision.  Respiratory: Negative.  Negative for shortness of breath.   Cardiovascular: Negative.   Gastrointestinal: Negative.   Endocrine: Negative.   Genitourinary: Negative.   Musculoskeletal: Negative.   Skin: Negative.   Allergic/Immunologic: Negative.   Neurological: Negative.  Negative for headaches.  Hematological: Negative.   Psychiatric/Behavioral: Negative.       Today's Vitals   07/02/23 1409  BP: 122/84  Pulse: 77  Temp: 97.9 F (36.6 C)  SpO2: 98%  Weight: 225  lb 3.2 oz (102.2 kg)  Height: 6\' 2"  (1.88 m)   Body mass index is 28.91 kg/m.  Wt Readings from Last 3 Encounters:  07/02/23 225 lb 3.2 oz (102.2 kg)  01/26/23 231 lb 12.8 oz (105.1 kg)  12/28/22 234 lb (106.1 kg)     Objective:  Physical Exam Vitals and nursing note reviewed.  Constitutional:      Appearance: Normal appearance.  HENT:     Head: Normocephalic and atraumatic.     Right Ear: Tympanic membrane, ear canal and external ear normal.     Left Ear: Tympanic membrane, ear canal and external ear normal.     Nose: Nose normal.     Mouth/Throat:     Mouth: Mucous membranes are moist.      Pharynx: Oropharynx is clear.  Eyes:     Extraocular Movements: Extraocular movements intact.     Conjunctiva/sclera: Conjunctivae normal.     Pupils: Pupils are equal, round, and reactive to light.  Cardiovascular:     Rate and Rhythm: Normal rate and regular rhythm.     Pulses: Normal pulses.          Dorsalis pedis pulses are 2+ on the right side and 2+ on the left side.     Heart sounds: Normal heart sounds.  Pulmonary:     Effort: Pulmonary effort is normal.     Breath sounds: Normal breath sounds.  Chest:  Breasts:    Right: Normal. No swelling, bleeding, inverted nipple, mass or nipple discharge.     Left: Normal. No swelling, bleeding, inverted nipple, mass or nipple discharge.  Abdominal:     General: Abdomen is flat. Bowel sounds are normal.     Palpations: Abdomen is soft.  Genitourinary:    Comments: Declined  Musculoskeletal:        General: Normal range of motion.     Cervical back: Normal range of motion and neck supple.  Skin:    General: Skin is warm.  Neurological:     General: No focal deficit present.     Mental Status: He is alert.  Psychiatric:        Mood and Affect: Mood normal.        Behavior: Behavior normal.         Assessment And Plan:     Encounter for general adult medical examination w/o abnormal findings Assessment & Plan: A full exam was performed. DRE deferred, he is also followed by Urology. PATIENT IS ADVISED TO GET 30-45 MINUTES REGULAR EXERCISE NO LESS THAN FOUR TO FIVE DAYS PER WEEK - BOTH WEIGHTBEARING EXERCISES AND AEROBIC ARE RECOMMENDED.  PATIENT IS ADVISED TO FOLLOW A HEALTHY DIET WITH AT LEAST SIX FRUITS/VEGGIES PER DAY, DECREASE INTAKE OF RED MEAT, AND TO INCREASE FISH INTAKE TO TWO DAYS PER WEEK.  MEATS/FISH SHOULD NOT BE FRIED, BAKED OR BROILED IS PREFERABLE.  IT IS ALSO IMPORTANT TO CUT BACK ON YOUR SUGAR INTAKE. PLEASE AVOID ANYTHING WITH ADDED SUGAR, CORN SYRUP OR OTHER SWEETENERS. IF YOU MUST USE A SWEETENER, YOU CAN TRY  STEVIA. IT IS ALSO IMPORTANT TO AVOID ARTIFICIALLY SWEETENERS AND DIET BEVERAGES. LASTLY, I SUGGEST WEARING SPF 50 SUNSCREEN ON EXPOSED PARTS AND ESPECIALLY WHEN IN THE DIRECT SUNLIGHT FOR AN EXTENDED PERIOD OF TIME.  PLEASE AVOID FAST FOOD RESTAURANTS AND INCREASE YOUR WATER INTAKE.    Hypertensive heart disease without heart failure Assessment & Plan: Chronic, fair control. Goal BP<120/80.  EKG performed, NSR w/ first AV block, anterolateral ST-elevation/repolarization  variant.  He is reminded to follow low sodium diet. He will continue with telmisartan 20mg  daily. NO med changes today. He will f/u in 4-6 months for re-evaluation.   Orders: -     POCT urinalysis dipstick -     Microalbumin / creatinine urine ratio -     EKG 12-Lead -     CBC -     CMP14+EGFR -     Lipid panel; Future  Coronary artery disease of native artery of native heart with stable angina pectoris [facility]) Assessment & Plan: Chronic, LDL goal<70.  He will continue with atorvastatin 40mg  Sun-Friday, skipping Saturdays for now. He never increased the dose earlier this year. He agrees to rto in six weeks for fasting lipid panel(he is not fasting today).  He is encouraged to follow heart healthy lifestyle.    Pure hypercholesterolemia Assessment & Plan: Chronic, encouraged  to follow heart healthy lifestyle. He will continue with atorvastatin 40mg  6 days weekly.    Overweight with body mass index (BMI) of 28 to 28.9 in adult Assessment & Plan: He is encouraged to aim for at least 150 minutes of exercise/week.       Return in 6 weeks (on 08/13/2023), or chol check, fasting lab visit, for 1 year HM, 4 month bp check. Patient was given opportunity to ask questions. Patient verbalized understanding of the plan and was able to repeat key elements of the plan. All questions were answered to their satisfaction.   I, Gwynneth Aliment, MD, have reviewed all documentation for this visit. The documentation on 07/02/23 for the  exam, diagnosis, procedures, and orders are all accurate and complete.

## 2023-07-25 DIAGNOSIS — H40013 Open angle with borderline findings, low risk, bilateral: Secondary | ICD-10-CM | POA: Diagnosis not present

## 2023-07-25 DIAGNOSIS — H5203 Hypermetropia, bilateral: Secondary | ICD-10-CM | POA: Diagnosis not present

## 2023-08-03 ENCOUNTER — Other Ambulatory Visit: Payer: Self-pay | Admitting: Hematology and Oncology

## 2023-08-03 ENCOUNTER — Inpatient Hospital Stay: Payer: PPO | Attending: Hematology and Oncology

## 2023-08-03 ENCOUNTER — Inpatient Hospital Stay (HOSPITAL_BASED_OUTPATIENT_CLINIC_OR_DEPARTMENT_OTHER): Payer: PPO | Admitting: Hematology and Oncology

## 2023-08-03 DIAGNOSIS — Z7901 Long term (current) use of anticoagulants: Secondary | ICD-10-CM | POA: Insufficient documentation

## 2023-08-03 DIAGNOSIS — Z86711 Personal history of pulmonary embolism: Secondary | ICD-10-CM | POA: Diagnosis not present

## 2023-08-03 DIAGNOSIS — I2699 Other pulmonary embolism without acute cor pulmonale: Secondary | ICD-10-CM

## 2023-08-03 DIAGNOSIS — Z86718 Personal history of other venous thrombosis and embolism: Secondary | ICD-10-CM | POA: Insufficient documentation

## 2023-08-03 LAB — CBC WITH DIFFERENTIAL (CANCER CENTER ONLY)
Abs Immature Granulocytes: 0.01 10*3/uL (ref 0.00–0.07)
Basophils Absolute: 0 10*3/uL (ref 0.0–0.1)
Basophils Relative: 0 %
Eosinophils Absolute: 0.1 10*3/uL (ref 0.0–0.5)
Eosinophils Relative: 2 %
HCT: 41.8 % (ref 39.0–52.0)
Hemoglobin: 13.9 g/dL (ref 13.0–17.0)
Immature Granulocytes: 0 %
Lymphocytes Relative: 35 %
Lymphs Abs: 1.6 10*3/uL (ref 0.7–4.0)
MCH: 28.7 pg (ref 26.0–34.0)
MCHC: 33.3 g/dL (ref 30.0–36.0)
MCV: 86.4 fL (ref 80.0–100.0)
Monocytes Absolute: 0.4 10*3/uL (ref 0.1–1.0)
Monocytes Relative: 8 %
Neutro Abs: 2.5 10*3/uL (ref 1.7–7.7)
Neutrophils Relative %: 55 %
Platelet Count: 165 10*3/uL (ref 150–400)
RBC: 4.84 MIL/uL (ref 4.22–5.81)
RDW: 13.1 % (ref 11.5–15.5)
WBC Count: 4.6 10*3/uL (ref 4.0–10.5)
nRBC: 0 % (ref 0.0–0.2)

## 2023-08-03 LAB — CMP (CANCER CENTER ONLY)
ALT: 26 U/L (ref 0–44)
AST: 40 U/L (ref 15–41)
Albumin: 3.9 g/dL (ref 3.5–5.0)
Alkaline Phosphatase: 62 U/L (ref 38–126)
Anion gap: 4 — ABNORMAL LOW (ref 5–15)
BUN: 16 mg/dL (ref 8–23)
CO2: 27 mmol/L (ref 22–32)
Calcium: 9.1 mg/dL (ref 8.9–10.3)
Chloride: 105 mmol/L (ref 98–111)
Creatinine: 0.97 mg/dL (ref 0.61–1.24)
GFR, Estimated: >60 mL/min (ref >60)
Glucose, Bld: 104 mg/dL — ABNORMAL HIGH (ref 70–99)
Potassium: 4.3 mmol/L (ref 3.5–5.1)
Sodium: 136 mmol/L (ref 135–145)
Total Bilirubin: 0.7 mg/dL (ref 0.3–1.2)
Total Protein: 7.2 g/dL (ref 6.5–8.1)

## 2023-08-03 NOTE — Progress Notes (Signed)
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  Patient started on Xarelto therapy. 06/30/2021: Ultrasound of lower extremities bilaterally showed on the right side acute deep vein thrombosis involving the popliteal, right femoral vein as well as on the left side acute deep vein thrombosis involving the left femoral vein, left popliteal vein, and left peroneal veins 01/13/2022: Repeat lower extremity ultrasound showed recannulized thrombus in the left popliteal veins findings consistent with chronic deep vein thrombosis. 01/26/2022: Establish care with Dr. Leonides Schanz.  Interval History:  Marc [name] 72 y.o. male with medical history significant for bilateral pulmonary emboli and DVTs who presents for a follow up visit. The patient's last visit was on 07/28/2022. In the interim since the last visit he has continued on Xarelto therapy.  On exam today Marc [name] notes he has had no major changes in his health in the interim since her last visit.  He reports that he is tolerating his Xarelto therapy well with no trouble.  He is not having any bleeding, bruising, or dark stools.  He reports that he gets a cut or scratch he is able to control the bleeding without difficulty.  He notes that the cost of the medication is about $10 per month.  He reports it is "not bad at all".  He notes he has no signs or symptoms concerning for recurrent VTE.  He has no upcoming planned dental or surgical visits.  He reports that he continues to workout 5 days a week at the gym.  He is  doing bike riding, swimming, and his goal is to try to get his weight down further, to at least 225.  He notes he is staying away from sweets.  Overall he feels well and is willing and able to continue on Xarelto therapy at this time.  He otherwise denies any fevers, chills, sweats, nausea, vomiting or diarrhea.  Full 10 point ROS is listed below.  MEDICAL HISTORY:  Past Medical History:  Diagnosis Date   History of cocaine abuse (HCC)    Hypertension    Osteoarthritis     SURGICAL HISTORY: Past Surgical History:  Procedure Laterality Date   KNEE ARTHROPLASTY Left 06/21/11   Ollen Gross, MD    SOCIAL HISTORY: Social History   Socioeconomic History   Marital status: Single    Spouse name: Not on file   Number of children: 0   Years of education: [employer]   Highest education level: Not on file  Occupational History   Occupation: Scientist, forensic: UNEMPLOYED    Comment: since 2009   Occupation: retired  Tobacco Use   Smoking status: Never   Smokeless tobacco: Never  Vaping Use   Vaping status: Never Used  Substance and Sexual Activity   Alcohol use: Not Currently    Comment: beer a week maybe   Drug use: Not Currently    Comment: Last in Nov 2013   Sexual activity: Not Currently  Other Topics Concern   Not on file  Social History Narrative   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)   Social Determinants of Health  Financial Resource Strain: Low Risk  (08/03/2022)   Overall Financial Resource Strain (CARDIA)    Difficulty of Paying Living Expenses: Not hard at all  Food Insecurity: No Food Insecurity (08/03/2022)   Hunger Vital Sign    Worried About Running Out of Food in the Last Year: Never true    Ran Out of Food in the Last Year: Never true  Transportation Needs: No Transportation Needs (08/03/2022)   PRAPARE - Administrator, Civil Service (Medical): No    Lack of Transportation (Non-Medical): No  Physical Activity: Sufficiently Active  (08/03/2022)   Exercise Vital Sign    Days of Exercise per Week: 5 days    Minutes of Exercise per Session: 120 min  Stress: No Stress Concern Present (07/07/2021)   [employer] of Occupational Health - Occupational Stress Questionnaire    Feeling of Stress : Not at all  Social Connections: Not on file  Intimate Partner Violence: Not At Risk (06/04/2019)   Humiliation, Afraid, Rape, and Kick questionnaire    Fear of Current or Ex-Partner: No    Emotionally Abused: No    Physically Abused: No    Sexually Abused: No    FAMILY HISTORY: Family History  Problem Relation Age of Onset   Stroke Mother 56   Hypertension Mother    Healthy Father     ALLERGIES:  has No Known Allergies.  MEDICATIONS:  Current Outpatient Medications  Medication Sig Dispense Refill   atorvastatin (LIPITOR) 40 MG tablet TAKE 1 TABLET DAILY. MON-SAT SKIP SUNDAYS 30 tablet 11   gabapentin (NEURONTIN) 300 MG capsule TAKE 1 CAPSULE BY MOUTH EVERYDAY AT BEDTIME 90 capsule 1   magnesium gluconate (MAGONATE) 500 MG tablet Take 500 mg by mouth daily.     telmisartan (MICARDIS) 20 MG tablet TAKE 1 TABLET BY MOUTH EVERY DAY 90 tablet 1   triamcinolone cream (KENALOG) 0.1 % APPLY TO AFFECTED AREA TWICE DAILY AS NEEDED 45 g 0   VITAMIN D, CHOLECALCIFEROL, PO Take by mouth. 2 times per day     XARELTO 10 MG TABS tablet TAKE 1 TABLET BY MOUTH EVERY DAY 30 tablet 5   No current facility-administered medications for this visit.    REVIEW OF SYSTEMS:   Constitutional: ( - ) fevers, ( - )  chills , ( - ) night sweats Eyes: ( - ) blurriness of vision, ( - ) double vision, ( - ) watery eyes Ears, nose, mouth, throat, and face: ( - ) mucositis, ( - ) sore throat Respiratory: ( - ) cough, ( - ) dyspnea, ( - ) wheezes Cardiovascular: ( - ) palpitation, ( - ) chest discomfort, ( - ) lower extremity swelling Gastrointestinal:  ( - ) nausea, ( - ) heartburn, ( - ) change in bowel habits Skin: ( - ) abnormal skin  rashes Lymphatics: ( - ) new lymphadenopathy, ( - ) easy bruising Neurological: ( - ) numbness, ( - ) tingling, ( - ) new weaknesses Behavioral/Psych: ( - ) mood change, ( - ) new changes  All other systems were reviewed with the patient and are negative.  PHYSICAL EXAMINATION:  There were no vitals filed for this visit.  There were no vitals filed for this visit.   GENERAL: Well-appearing elderly African-American male, alert, no distress and comfortable SKIN: skin color, texture, turgor are normal, no rashes or significant lesions EYES: conjunctiva are pink and non-injected, sclera clear LUNGS: clear to auscultation and percussion with normal breathing effort HEART:  regular rate & rhythm and no murmurs and no lower extremity edema on right leg, chronic swelling of left leg.  Musculoskeletal: no cyanosis of digits and no clubbing  PSYCH: alert & oriented x 3, fluent speech NEURO: no focal motor/sensory deficits  LABORATORY DATA:  I have reviewed the data as listed    Latest Ref Rng & Units 08/03/2023   11:01 AM 07/02/2023    3:03 PM 01/26/2023   11:07 AM  CBC  WBC 4.0 - 10.5 K/uL 4.6  6.2  5.6   Hemoglobin 13.0 - 17.0 g/dL 47.8  29.5  62.1   Hematocrit 39.0 - 52.0 % 41.8  44.1  41.6   Platelets 150 - 400 K/uL 165  CANCELED  168        Latest Ref Rng & Units 08/03/2023   11:01 AM 07/02/2023    3:03 PM 01/26/2023   11:07 AM  CMP  Glucose 70 - 99 mg/dL 308  79  86   BUN 8 - 23 mg/dL 16  15  20    Creatinine 0.61 - 1.24 mg/dL 6.57  8.46  9.62   Sodium 135 - 145 mmol/L 136  138  137   Potassium 3.5 - 5.1 mmol/L 4.3  4.5  4.5   Chloride 98 - 111 mmol/L 105  103  103   CO2 22 - 32 mmol/L 27  23  28    Calcium 8.9 - 10.3 mg/dL 9.1  9.4  9.2   Total Protein 6.5 - 8.1 g/dL 7.2  7.2  7.3   Total Bilirubin 0.3 - 1.2 mg/dL 0.7  0.5  0.6   Alkaline Phos 38 - 126 U/L 62  82  58   AST 15 - 41 U/L 40  27  75   ALT 0 - 44 U/L 26  23  43     RADIOGRAPHIC STUDIES: No results  found.  ASSESSMENT & PLAN Marc [name] 72 y.o. male with medical history significant for bilateral pulmonary emboli and DVTs who presents for a follow up visit.  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   A provoked venous thromboembolism (VTE) is one that has a clear inciting factor or event. Provoking factors include prolonged travel/immobility, surgery (particular abdominal or orthropedic), trauma,  and pregnancy/ estrogen containing birth control. After a detailed history and review of the records there is no clear provoking factor for this patient's VTE.  Patients with unprovoked VTEs have up to 25% recurrence after 5 years and 36% at 10 years, with 4% of these clots being fatal (BMJ?2019;366:l4363). Therefore the formal recommendation for unprovoked VTE's is lifelong anticoagulation, as the cause may not be transient or reversible. We recommend 6 months or full strength anticoagulation with a re-evaluation after that time.  The patient's will then have a choice of maintenance dose DOAC (preferred, recommended), 81mg  ASA PO daily (non-preferred), or no further anticoagulation (not recommended).     # Unprovoked DVT/Pulmonary Embolism  --findings at this time are consistent with a unprovoked VTE  --will order CMP and CBC to assure labs are adequate for DOAC therapy  --ruled out APS with anticardiolipin and anti beta2 glycoprotein antibodies.  Lupus anticoagulant panel would be altered by presence of blood thinner, will hold on this testing.   --recommend the patient continue Xarelto 10 mg p.o. daily for maintenance therapy. --patient denies any bleeding, bruising, or dark  stools on this medication. It is well tolerated. No difficulties accessing/affording the medication  --Labs today show white blood cell count 4.6, hemoglobin 13.9, MCV 86.4, and platelets of  165 --RTC in 6 months' time with strict return precautions for overt signs of bleeding.    No orders of the defined types were placed in this encounter.   All questions were answered. The patient knows to call the clinic with any problems, questions or concerns.  A total of more than 25 minutes were spent on this encounter with face-to-face time and non-face-to-face time, including preparing to see the patient, ordering tests and/or medications, counseling the patient and coordination of care as outlined above.   Ulysees Barns, MD Department of Hematology/Oncology [facility] Phone: [phone] Pager: [phone] Email: [email or web address]  08/03/2023 4:16 PM

## 2023-08-14 ENCOUNTER — Other Ambulatory Visit: Payer: PPO

## 2023-08-14 NOTE — Patient Instructions (Signed)
Hypertension, Adult Hypertension is another name for high blood pressure. High blood pressure forces your heart to work harder to pump blood. This can cause problems over time. There are two numbers in a blood pressure reading. There is a top number (systolic) over a bottom number (diastolic). It is best to have a blood pressure that is below 120/80. What are the causes? The cause of this condition is not known. Some other conditions can lead to high blood pressure. What increases the risk? Some lifestyle factors can make you more likely to develop high blood pressure: Smoking. Not getting enough exercise or physical activity. Being overweight. Having too much fat, sugar, calories, or salt (sodium) in your diet. Drinking too much alcohol. Other risk factors include: Having any of these conditions: Heart disease. Diabetes. High cholesterol. Kidney disease. Obstructive sleep apnea. Having a family history of high blood pressure and high cholesterol. Age. The risk increases with age. Stress. What are the signs or symptoms? High blood pressure may not cause symptoms. Very high blood pressure (hypertensive crisis) may cause: Headache. Fast or uneven heartbeats (palpitations). Shortness of breath. Nosebleed. Vomiting or feeling like you may vomit (nauseous). Changes in how you see. Very bad chest pain. Feeling dizzy. Seizures. How is this treated? This condition is treated by making healthy lifestyle changes, such as: Eating healthy foods. Exercising more. Drinking less alcohol. Your doctor may prescribe medicine if lifestyle changes do not help enough and if: Your top number is above 130. Your bottom number is above 80. Your personal target blood pressure may vary. Follow these instructions at home: Eating and drinking  If told, follow the DASH eating plan. To follow this plan: Fill one half of your plate at each meal with fruits and vegetables. Fill one fourth of your plate  at each meal with whole grains. Whole grains include whole-wheat pasta, brown rice, and whole-grain bread. Eat or drink low-fat dairy products, such as skim milk or low-fat yogurt. Fill one fourth of your plate at each meal with low-fat (lean) proteins. Low-fat proteins include fish, chicken without skin, eggs, beans, and tofu. Avoid fatty meat, cured and processed meat, or chicken with skin. Avoid pre-made or processed food. Limit the amount of salt in your diet to less than 1,500 mg each day. Do not drink alcohol if: Your doctor tells you not to drink. You are pregnant, may be pregnant, or are planning to become pregnant. If you drink alcohol: Limit how much you have to: 0-1 drink a day for women. 0-2 drinks a day for men. Know how much alcohol is in your drink. In the [place] one drink equals one 12 oz bottle of beer (355 mL), one 5 oz glass of wine (148 mL), or one 1 oz glass of hard liquor (44 mL). Lifestyle  Work with your doctor to stay at a healthy weight or to lose weight. Ask your doctor what the best weight is for you. Get at least 30 minutes of exercise that causes your heart to beat faster (aerobic exercise) most days of the week. This may include walking, swimming, or biking. Get at least 30 minutes of exercise that strengthens your muscles (resistance exercise) at least 3 days a week. This may include lifting weights or doing Pilates. Do not smoke or use any products that contain nicotine or tobacco. If you need help quitting, ask your doctor. Check your blood pressure at home as told by your doctor. Keep all follow-up visits. Medicines Take over-the-counter and prescription medicines  only as told by your doctor. Follow directions carefully. Do not skip doses of blood pressure medicine. The medicine does not work as well if you skip doses. Skipping doses also puts you at risk for problems. Ask your doctor about side effects or reactions to medicines that you should watch  for. Contact a doctor if: You think you are having a reaction to the medicine you are taking. You have headaches that keep coming back. You feel dizzy. You have swelling in your ankles. You have trouble with your vision. Get help right away if: You get a very bad headache. You start to feel mixed up (confused). You feel weak or numb. You feel faint. You have very bad pain in your: Chest. Belly (abdomen). You vomit more than once. You have trouble breathing. These symptoms may be an emergency. Get help right away. Call 911. Do not wait to see if the symptoms will go away. Do not drive yourself to the hospital. Summary Hypertension is another name for high blood pressure. High blood pressure forces your heart to work harder to pump blood. For most people, a normal blood pressure is less than 120/80. Making healthy choices can help lower blood pressure. If your blood pressure does not get lower with healthy choices, you may need to take medicine. This information is not intended to replace advice given to you by your health care provider. Make sure you discuss any questions you have with your health care provider. Document Revised: 09/01/2021 Document Reviewed: 09/01/2021 Elsevier Patient Education  2024 [employer].

## 2023-08-14 NOTE — Progress Notes (Unsigned)
I,Victoria T Deloria Lair, CMA,acting as a Neurosurgeon for Gwynneth Aliment, MD.,have documented all relevant documentation on the behalf of Gwynneth Aliment, MD,as directed by  Gwynneth Aliment, MD while in the presence of Gwynneth Aliment, MD.  Subjective:  Patient ID: Marc [name] , male    DOB: [birth year] , 72 y.o.   MRN: [id number]  No chief complaint on file.   HPI  The patient is here today for blood pressure f/u.  He reports compliance with meds. He denies headaches, chest pain and shortness of breath.   Hypertension This is a chronic problem. The current episode started more than 1 year ago. The problem has been gradually improving since onset. The problem is controlled. Pertinent negatives include no blurred vision, chest pain, headaches or shortness of breath. Neck pain: he has been eval by Dr. Shon Baton. He has had some relief of his sx. Now on higher dose of gabapentin. He has gone to PT. He has had Nerve conduction testing by Dr. Ethelene Hal. He does feel he is starting to improve. .Past treatments include angiotensin blockers. The current treatment provides moderate improvement. There are no compliance problems.   Hyperlipidemia Pertinent negatives include no chest pain or shortness of breath.     Past Medical History:  Diagnosis Date   History of cocaine abuse (HCC)    Hypertension    Osteoarthritis      Family History  Problem Relation Age of Onset   Stroke Mother 33   Hypertension Mother    Healthy Father      Current Outpatient Medications:    atorvastatin (LIPITOR) 40 MG tablet, TAKE 1 TABLET DAILY. MON-SAT SKIP SUNDAYS, Disp: 30 tablet, Rfl: 11   gabapentin (NEURONTIN) 300 MG capsule, TAKE 1 CAPSULE BY MOUTH EVERYDAY AT BEDTIME, Disp: 90 capsule, Rfl: 1   magnesium gluconate (MAGONATE) 500 MG tablet, Take 500 mg by mouth daily., Disp: , Rfl:    telmisartan (MICARDIS) 20 MG tablet, TAKE 1 TABLET BY MOUTH EVERY DAY, Disp: 90 tablet, Rfl: 1   triamcinolone cream (KENALOG) 0.1 %, APPLY  TO AFFECTED AREA TWICE DAILY AS NEEDED, Disp: 45 g, Rfl: 0   VITAMIN D, CHOLECALCIFEROL, PO, Take by mouth. 2 times per day, Disp: , Rfl:    XARELTO 10 MG TABS tablet, TAKE 1 TABLET BY MOUTH EVERY DAY, Disp: 30 tablet, Rfl: 5   No Known Allergies   Review of Systems  Constitutional: Negative.   HENT: Negative.    Eyes:  Negative for blurred vision.  Respiratory: Negative.  Negative for shortness of breath.   Cardiovascular:  Negative for chest pain.  Gastrointestinal: Negative.   Musculoskeletal:  Neck pain: he has been eval by Dr. Shon Baton. He has had some relief of his sx. Now on higher dose of gabapentin. He has gone to PT. He has had Nerve conduction testing by Dr. Ethelene Hal. He does feel he is starting to improve. .  Skin: Negative.   Allergic/Immunologic: Negative.   Neurological: Negative.  Negative for headaches.  Hematological: Negative.      There were no vitals filed for this visit. There is no height or weight on file to calculate BMI.  Wt Readings from Last 3 Encounters:  07/02/23 225 lb 3.2 oz (102.2 kg)  01/26/23 231 lb 12.8 oz (105.1 kg)  12/28/22 234 lb (106.1 kg)     Objective:  Physical Exam      Assessment And Plan:  Hypertensive heart disease without heart failure  Pure hypercholesterolemia  Coronary  artery disease of native artery of native heart with stable angina pectoris (HCC)     No follow-ups on file.  Patient was given opportunity to ask questions. Patient verbalized understanding of the plan and was able to repeat key elements of the plan. All questions were answered to their satisfaction.  Gwynneth Aliment, MD  I, Gwynneth Aliment, MD, have reviewed all documentation for this visit. The documentation on 08/14/23 for the exam, diagnosis, procedures, and orders are all accurate and complete.   IF YOU HAVE BEEN REFERRED TO A SPECIALIST, IT MAY TAKE 1-2 WEEKS TO SCHEDULE/PROCESS THE REFERRAL. IF YOU HAVE NOT HEARD FROM US/SPECIALIST IN TWO WEEKS,  PLEASE GIVE [place] A CALL AT [phone] X [phone].   THE PATIENT IS ENCOURAGED TO PRACTICE SOCIAL DISTANCING DUE TO THE COVID-19 PANDEMIC.

## 2023-08-15 ENCOUNTER — Ambulatory Visit: Payer: PPO | Admitting: Internal Medicine

## 2023-08-15 ENCOUNTER — Encounter: Payer: Self-pay | Admitting: Internal Medicine

## 2023-08-15 ENCOUNTER — Other Ambulatory Visit: Payer: PPO

## 2023-08-15 ENCOUNTER — Ambulatory Visit (INDEPENDENT_AMBULATORY_CARE_PROVIDER_SITE_OTHER): Payer: PPO

## 2023-08-15 VITALS — BP 108/70 | HR 89 | Temp 98.1°F | Ht 74.0 in | Wt 226.4 lb

## 2023-08-15 VITALS — BP 108/70 | HR 89 | Temp 97.8°F | Ht 74.0 in | Wt 226.4 lb

## 2023-08-15 DIAGNOSIS — Z0000 Encounter for general adult medical examination without abnormal findings: Secondary | ICD-10-CM | POA: Diagnosis not present

## 2023-08-15 DIAGNOSIS — I25118 Atherosclerotic heart disease of native coronary artery with other forms of angina pectoris: Secondary | ICD-10-CM | POA: Diagnosis not present

## 2023-08-15 DIAGNOSIS — Z23 Encounter for immunization: Secondary | ICD-10-CM

## 2023-08-15 DIAGNOSIS — I119 Hypertensive heart disease without heart failure: Secondary | ICD-10-CM

## 2023-08-15 DIAGNOSIS — R5383 Other fatigue: Secondary | ICD-10-CM | POA: Diagnosis not present

## 2023-08-15 DIAGNOSIS — E7800 Pure hypercholesterolemia, unspecified: Secondary | ICD-10-CM | POA: Diagnosis not present

## 2023-08-15 MED ORDER — ATORVASTATIN CALCIUM 40 MG PO TABS: ORAL_TABLET | ORAL | 2 refills | Status: DC

## 2023-08-15 MED ORDER — TELMISARTAN 20 MG PO TABS: 20.0000 mg | ORAL_TABLET | Freq: Every day | ORAL | 1 refills | Status: DC

## 2023-08-15 NOTE — Patient Instructions (Signed)
[name] , Thank you for taking time to come for your Medicare Wellness Visit. I appreciate your ongoing commitment to your health goals. Please review the following plan we discussed and let me know if I can assist you in the future.   Referrals/Orders/Follow-Ups/Clinician Recommendations: none  This is a list of the screening recommended for you and due dates:  Health Maintenance  Topic Date Due   Flu Shot  06/28/2023   COVID-19 Vaccine (7 - 2023-24 season) 07/29/2023   Medicare Annual Wellness Visit  08/14/2024   Colon Cancer Screening  03/28/2026   DTaP/Tdap/Td vaccine (2 - Td or Tdap) 06/03/2029   Pneumonia Vaccine  Completed   Hepatitis C Screening  Completed   Zoster (Shingles) Vaccine  Completed   HPV Vaccine  Aged Out    Advanced directives: (Declined) Advance directive discussed with you today. Even though you declined this today, please call our office should you change your mind, and we can give you the proper paperwork for you to fill out.  Next Medicare Annual Wellness Visit scheduled for next year: Yes  insert Preventive Care attachment Insert FALL PREVENTION attachment if needed

## 2023-08-15 NOTE — Assessment & Plan Note (Addendum)
Chronic, LDL goal<70.  He will continue with atorvastatin 40mg  Sun-Friday, skipping Saturdays for now. He never increased the dose earlier this year.  He is encouraged to follow heart healthy lifestyle.

## 2023-08-15 NOTE — Assessment & Plan Note (Signed)
Chronic, well controlled.   He will continue with telmisartan 20mg  daily. No med changes today. He will f/u in 4-6 months for re-evaluation.

## 2023-08-15 NOTE — Progress Notes (Signed)
Subjective:   Marc [name] is a 72 y.o. male who presents for Medicare Annual/Subsequent preventive examination.  Visit Complete: In person  Patient Medicare AWV questionnaire was completed by the patient on 08/14/2023; I have confirmed that all information answered by patient is correct and no changes since this date.  Cardiac Risk Factors include: advanced age (>37men, >89 women);hypertension;male gender     Objective:    Today's Vitals   08/15/23 0856  BP: 108/70  Pulse: 89  Temp: 97.8 F (36.6 C)  TempSrc: Oral  SpO2: 99%  Weight: 226 lb 6.4 oz (102.7 kg)  Height: 6\' 2"  (1.88 m)   Body mass index is 29.07 kg/m.     08/15/2023    9:02 AM 08/03/2022    9:13 AM 07/07/2021   10:12 AM 06/12/2021    9:26 AM 06/09/2020   10:45 AM 06/04/2019    2:29 PM 05/29/2013    8:08 PM  Advanced Directives  Does Patient Have a Medical Advance Directive? No No No No No No Patient does not have advance directive;Patient would like information  Would patient like information on creating a medical advance directive? No - Patient declined No - Patient declined Yes (MAU/Ambulatory/Procedural Areas - Information given) No - Patient declined Yes (MAU/Ambulatory/Procedural Areas - Information given) Yes (MAU/Ambulatory/Procedural Areas - Information given)     Current Medications (verified) Outpatient Encounter Medications as of 08/15/2023  Medication Sig   atorvastatin (LIPITOR) 40 MG tablet TAKE 1 TABLET DAILY. MON-SAT SKIP SUNDAYS   gabapentin (NEURONTIN) 300 MG capsule TAKE 1 CAPSULE BY MOUTH EVERYDAY AT BEDTIME   magnesium gluconate (MAGONATE) 500 MG tablet Take 500 mg by mouth daily.   telmisartan (MICARDIS) 20 MG tablet TAKE 1 TABLET BY MOUTH EVERY DAY   VITAMIN D, CHOLECALCIFEROL, PO Take by mouth. 2 times per day   XARELTO 10 MG TABS tablet TAKE 1 TABLET BY MOUTH EVERY DAY   triamcinolone cream (KENALOG) 0.1 % APPLY TO AFFECTED AREA TWICE DAILY AS NEEDED (Patient not taking: Reported on  08/15/2023)   No facility-administered encounter medications on file as of 08/15/2023.    Allergies (verified) Patient has no known allergies.   History: Past Medical History:  Diagnosis Date   History of cocaine abuse (HCC)    Hypertension    Osteoarthritis    Past Surgical History:  Procedure Laterality Date   JOINT REPLACEMENT  06/22/11   KNEE ARTHROPLASTY Left 06/21/2011   Marc Gross, MD   Family History  Problem Relation Age of Onset   Stroke Mother 5   Hypertension Mother    Healthy Father    Social History   Socioeconomic History   Marital status: Single    Spouse name: Not on file   Number of children: 0   Years of education: [employer]   Highest education level: Not on file  Occupational History   Occupation: Scientist, forensic: UNEMPLOYED    Comment: since 2009   Occupation: retired  Tobacco Use   Smoking status: Never   Smokeless tobacco: Never  Vaping Use   Vaping status: Never Used  Substance and Sexual Activity   Alcohol use: Not Currently    Comment: occasional   Drug use: Not Currently    Comment: Last in Nov 2013   Sexual activity: Not Currently    Birth control/protection: Condom  Other Topics Concern   Not on file  Social History Narrative   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 (36 yo)   Social  Determinants of Health   Financial Resource Strain: Low Risk  (08/14/2023)   Overall Financial Resource Strain (CARDIA)    Difficulty of Paying Living Expenses: Not very hard  Food Insecurity: Food Insecurity Present (08/14/2023)   Hunger Vital Sign    Worried About Running Out of Food in the Last Year: Sometimes true    Ran Out of Food in the Last Year: Never true  Transportation Needs: No Transportation Needs (08/14/2023)   PRAPARE - Administrator, Civil Service (Medical): No    Lack of Transportation (Non-Medical): No  Physical Activity: Sufficiently Active (08/14/2023)   Exercise Vital Sign    Days of Exercise per Week: 5  days    Minutes of Exercise per Session: 90 min  Stress: No Stress Concern Present (08/14/2023)   Marc [name] [employer] Occupational Health - Occupational Stress Questionnaire    Feeling of Stress : Only a little  Social Connections: Unknown (08/14/2023)   Social Connection and Isolation Panel [NHANES]    Frequency of Communication with Friends and Family: Twice a week    Frequency of Social Gatherings with Friends and Family: Once a week    Attends Religious Services: Not on Marketing executive or Organizations: No    Attends Banker Meetings: Never    Marital Status: Never married    Tobacco Counseling Counseling given: Not Answered   Clinical Intake:  Pre-visit preparation completed: Yes  Pain : No/denies pain     Nutritional Status: BMI 25 -29 Overweight Nutritional Risks: None Diabetes: No  How often do you need to have someone help you when you read instructions, pamphlets, or other written materials from your doctor or pharmacy?: 1 - Never  Interpreter Needed?: No  Information entered by :: NAllen LPN   Activities of Daily Living    08/14/2023    1:22 PM  In your present state of health, do you have any difficulty performing the following activities:  Hearing? 0  Vision? 0  Difficulty concentrating or making decisions? 0  Walking or climbing stairs? 0  Dressing or bathing? 0  Doing errands, shopping? 0  Preparing Food and eating ? N  Using the Toilet? N  In the past six months, have you accidently leaked urine? N  Do you have problems with loss of bowel control? N  Managing your Medications? N  Managing your Finances? N  Housekeeping or managing your Housekeeping? N    Patient Care Team: Marc Peng, MD as PCP - General (Internal Medicine) Marc Stade, MD as PCP - Cardiology (Cardiology)  Indicate any recent Medical Services you may have received from other than [facility] providers in the past year (date may be  approximate).     Assessment:   This is a routine wellness examination for Marc [name].  Hearing/Vision screen Hearing Screening - Comments:: Denies hearing issues Vision Screening - Comments:: Regular eye exams, Dr. Harlon Flor   Goals Addressed             This Visit's Progress    Patient Stated       08/15/2023, stay healthy       Depression Screen    08/15/2023    9:04 AM 07/02/2023    2:09 PM 09/20/2022    4:22 PM 08/03/2022    9:14 AM 07/07/2021   10:14 AM 06/22/2021    3:34 PM 06/09/2020   10:47 AM  PHQ 2/9 Scores  PHQ - 2 Score 0 0 0  0 0 0 0  PHQ- 9 Score 3 0     0    Fall Risk    08/14/2023    1:22 PM 07/02/2023    2:09 PM 09/20/2022    4:22 PM 08/03/2022    9:14 AM 07/07/2021   10:13 AM  Fall Risk   Falls in the past year? 0 0 0 0 0  Number falls in past yr: 0 0 0 0   Injury with Fall? 0 0 0 0   Risk for fall due to : Medication side effect No Fall Risks No Fall Risks Medication side effect Medication side effect  Follow up Falls prevention discussed;Falls evaluation completed Falls evaluation completed Falls evaluation completed Falls prevention discussed;Education provided;Falls evaluation completed Falls evaluation completed;Education provided;Falls prevention discussed    MEDICARE RISK AT HOME: Medicare Risk at Home Any stairs in or around the home?: No If so, are there any without handrails?: No Home free of loose throw rugs in walkways, pet beds, electrical cords, etc?: Yes Adequate lighting in your home to reduce risk of falls?: Yes Life alert?: No Use of a cane, walker or w/c?: No Grab bars in the bathroom?: No Shower chair or bench in shower?: No Elevated toilet seat or a handicapped toilet?: No  TIMED UP AND GO:  Was the test performed?  Yes  Length of time to ambulate 10 feet: 5 sec Gait steady and fast without use of assistive device    Cognitive Function:        08/15/2023    9:08 AM 08/03/2022    9:15 AM 07/07/2021   10:16 AM 06/09/2020    10:50 AM 06/04/2019    2:34 PM  6CIT Screen  What Year? 0 points 0 points 0 points 0 points 0 points  What month? 0 points 0 points 0 points 0 points 0 points  What time? 0 points 0 points 0 points 0 points 0 points  Count back from 20 0 points 0 points 0 points 0 points 0 points  Months in reverse 0 points 0 points 0 points 0 points 0 points  Repeat phrase 0 points 0 points 0 points 0 points 0 points  Total Score 0 points 0 points 0 points 0 points 0 points    Immunizations Immunization History  Administered Date(s) Administered   Fluad Quad(high Dose 65+) 08/14/2019, 08/03/2022   Fluad Trivalent(High Dose 65+) 08/15/2023   Influenza, High Dose Seasonal PF 08/27/2018, 08/09/2021   Influenza-Unspecified 08/14/2019, 07/23/2020   Moderna Covid-19 Vaccine Bivalent Booster 42yrs & up 08/10/2021, 09/12/2022   Moderna Sars-Covid-2 Vaccination 12/19/2019, 01/18/2020, 09/21/2020, 04/24/2021   Pneumococcal Conjugate-13 05/10/2018   Pneumococcal Polysaccharide-23 06/08/2019   Tdap 06/04/2019   Zoster Recombinant(Shingrix) 05/09/2020, 07/23/2020    TDAP status: Up to date  Flu Vaccine status: Completed at today's visit  Pneumococcal vaccine status: Up to date  Covid-19 vaccine status: Information provided on how to obtain vaccines.   Qualifies for Shingles Vaccine? Yes   Zostavax completed Yes   Shingrix Completed?: Yes  Screening Tests Health Maintenance  Topic Date Due   COVID-19 Vaccine (7 - 2023-24 season) 07/29/2023   Medicare Annual Wellness (AWV)  08/14/2024   Colonoscopy  03/28/2026   DTaP/Tdap/Td (2 - Td or Tdap) 06/03/2029   Pneumonia Vaccine 38+ Years old  Completed   INFLUENZA VACCINE  Completed   Hepatitis C Screening  Completed   Zoster Vaccines- Shingrix  Completed   HPV VACCINES  Aged Out    Health Maintenance  Health Maintenance Due  Topic Date Due   COVID-19 Vaccine (7 - 2023-24 season) 07/29/2023    Colorectal cancer screening: Type of screening:  Colonoscopy. Completed 03/28/2016. Repeat every 10 years  Lung Cancer Screening: (Low Dose CT Chest recommended if Age 12-80 years, 20 pack-year currently smoking OR have quit w/in 15years.) does not qualify.   Lung Cancer Screening Referral: no  Additional Screening:  Hepatitis C Screening: does qualify; Completed 06/04/2019  Vision Screening: Recommended annual ophthalmology exams for early detection of glaucoma and other disorders of the eye. Is the patient up to date with their annual eye exam?  Yes  Who is the provider or what is the name of the office in which the patient attends annual eye exams? Dr. Harlon Flor If pt is not established with a provider, would they like to be referred to a provider to establish care? No .   Dental Screening: Recommended annual dental exams for proper oral hygiene  Diabetic Foot Exam: n/a  Community Resource Referral / Chronic Care Management: CRR required this visit?  No   CCM required this visit?  No     Plan:     I have personally reviewed and noted the following in the patient's chart:   Medical and social history Use of alcohol, tobacco or illicit drugs  Current medications and supplements including opioid prescriptions. Patient is currently taking opioid prescriptions. Information provided to patient regarding non-opioid alternatives. Patient advised to discuss non-opioid treatment plan with their provider. Functional ability and status Nutritional status Physical activity Advanced directives List of other physicians Hospitalizations, surgeries, and ER visits in previous 12 months Vitals Screenings to include cognitive, depression, and falls Referrals and appointments  In addition, I have reviewed and discussed with patient certain preventive protocols, quality metrics, and best practice recommendations. A written personalized care plan for preventive services as well as general preventive health recommendations were provided to  patient.     Barb Merino, LPN   12/27/8655   After Visit Summary: (In Person-Printed) AVS printed and given to the patient  Nurse Notes: none

## 2023-08-15 NOTE — Assessment & Plan Note (Signed)
Chronic, LDL goal is less than 70. Will check fasting lipid panel today.

## 2023-08-16 LAB — LIPID PANEL
Chol/HDL Ratio: 2.3 ratio (ref 0.0–5.0)
Cholesterol, Total: 142 mg/dL (ref 100–199)
HDL: 62 mg/dL (ref >39)
LDL Chol Calc (NIH): 69 mg/dL (ref 0–99)
Triglycerides: 48 mg/dL (ref 0–149)
VLDL Cholesterol Cal: 11 mg/dL (ref 5–40)

## 2023-08-17 LAB — TESTOSTERONE,FREE AND TOTAL
Testosterone, Free: 12.1 pg/mL (ref 6.6–18.1)
Testosterone: 611 ng/dL (ref 264–916)

## 2023-08-26 ENCOUNTER — Other Ambulatory Visit: Payer: Self-pay | Admitting: Internal Medicine

## 2023-08-31 DIAGNOSIS — K5900 Constipation, unspecified: Secondary | ICD-10-CM | POA: Insufficient documentation

## 2023-08-31 DIAGNOSIS — K625 Hemorrhage of anus and rectum: Secondary | ICD-10-CM | POA: Insufficient documentation

## 2023-09-18 DIAGNOSIS — R972 Elevated prostate specific antigen [PSA]: Secondary | ICD-10-CM | POA: Diagnosis not present

## 2023-09-25 DIAGNOSIS — R972 Elevated prostate specific antigen [PSA]: Secondary | ICD-10-CM | POA: Diagnosis not present

## 2023-09-25 DIAGNOSIS — N5201 Erectile dysfunction due to arterial insufficiency: Secondary | ICD-10-CM | POA: Diagnosis not present

## 2023-09-26 ENCOUNTER — Other Ambulatory Visit: Payer: Self-pay | Admitting: Urology

## 2023-09-26 DIAGNOSIS — R972 Elevated prostate specific antigen [PSA]: Secondary | ICD-10-CM

## 2023-10-04 ENCOUNTER — Other Ambulatory Visit: Payer: Self-pay | Admitting: Hematology and Oncology

## 2023-10-23 ENCOUNTER — Other Ambulatory Visit: Payer: Self-pay | Admitting: Internal Medicine

## 2023-10-23 MED ORDER — GABAPENTIN 300 MG PO CAPS: ORAL_CAPSULE | ORAL | 1 refills | Status: DC

## 2023-11-01 ENCOUNTER — Ambulatory Visit: Payer: PPO | Admitting: Internal Medicine

## 2023-11-08 DIAGNOSIS — H5203 Hypermetropia, bilateral: Secondary | ICD-10-CM | POA: Diagnosis not present

## 2023-11-08 DIAGNOSIS — H40021 Open angle with borderline findings, high risk, right eye: Secondary | ICD-10-CM | POA: Diagnosis not present

## 2023-11-15 ENCOUNTER — Ambulatory Visit
Admission: RE | Admit: 2023-11-15 | Discharge: 2023-11-15 | Disposition: A | Discharge status: Home / Self Care | Payer: PPO | Source: Ambulatory Visit | Attending: Urology | Admitting: Urology

## 2023-11-15 DIAGNOSIS — R972 Elevated prostate specific antigen [PSA]: Secondary | ICD-10-CM

## 2023-11-15 DIAGNOSIS — M79605 Pain in left leg: Secondary | ICD-10-CM | POA: Diagnosis not present

## 2023-11-16 ENCOUNTER — Other Ambulatory Visit (HOSPITAL_COMMUNITY): Payer: Self-pay | Admitting: Orthopedic Surgery

## 2023-11-16 ENCOUNTER — Ambulatory Visit (HOSPITAL_COMMUNITY)
Admission: RE | Admit: 2023-11-16 | Discharge: 2023-11-16 | Disposition: A | Discharge status: Home / Self Care | Payer: PPO | Source: Ambulatory Visit | Attending: Cardiovascular Disease | Admitting: Cardiovascular Disease

## 2023-11-16 DIAGNOSIS — M79605 Pain in left leg: Secondary | ICD-10-CM | POA: Diagnosis not present

## 2023-11-16 DIAGNOSIS — M79606 Pain in leg, unspecified: Secondary | ICD-10-CM | POA: Diagnosis not present

## 2024-01-02 ENCOUNTER — Ambulatory Visit: Payer: PPO | Admitting: Internal Medicine

## 2024-01-02 ENCOUNTER — Encounter: Payer: Self-pay | Admitting: Internal Medicine

## 2024-01-02 VITALS — BP 134/82 | Temp 97.4°F | Ht 74.0 in | Wt 236.6 lb

## 2024-01-02 DIAGNOSIS — Z6830 Body mass index (BMI) 30.0-30.9, adult: Secondary | ICD-10-CM

## 2024-01-02 DIAGNOSIS — I25118 Atherosclerotic heart disease of native coronary artery with other forms of angina pectoris: Secondary | ICD-10-CM

## 2024-01-02 DIAGNOSIS — J029 Acute pharyngitis, unspecified: Secondary | ICD-10-CM | POA: Diagnosis not present

## 2024-01-02 DIAGNOSIS — R972 Elevated prostate specific antigen [PSA]: Secondary | ICD-10-CM

## 2024-01-02 DIAGNOSIS — E66811 Obesity, class 1: Secondary | ICD-10-CM | POA: Diagnosis not present

## 2024-01-02 DIAGNOSIS — I119 Hypertensive heart disease without heart failure: Secondary | ICD-10-CM | POA: Diagnosis not present

## 2024-01-02 DIAGNOSIS — E7800 Pure hypercholesterolemia, unspecified: Secondary | ICD-10-CM

## 2024-01-02 DIAGNOSIS — E6609 Other obesity due to excess calories: Secondary | ICD-10-CM

## 2024-01-02 LAB — CMP14+EGFR
ALT: 18 [IU]/L (ref 0–44)
AST: 27 [IU]/L (ref 0–40)
Albumin: 4 g/dL (ref 3.8–4.8)
Alkaline Phosphatase: 77 [IU]/L (ref 44–121)
BUN/Creatinine Ratio: 24 (ref 10–24)
BUN: 21 mg/dL (ref 8–27)
Bilirubin Total: 0.4 mg/dL (ref 0.0–1.2)
CO2: 21 mmol/L (ref 20–29)
Calcium: 9.1 mg/dL (ref 8.6–10.2)
Chloride: 103 mmol/L (ref 96–106)
Creatinine, Ser: 0.86 mg/dL (ref 0.76–1.27)
Globulin, Total: 3 g/dL (ref 1.5–4.5)
Glucose: 76 mg/dL (ref 70–99)
Potassium: 4.5 mmol/L (ref 3.5–5.2)
Sodium: 137 mmol/L (ref 134–144)
Total Protein: 7 g/dL (ref 6.0–8.5)
eGFR: 92 mL/min/{1.73_m2} (ref >59)

## 2024-01-02 NOTE — Assessment & Plan Note (Signed)
Chronic, LDL goal<70.  He will continue with atorvastatin 40mg  Sun-Friday, skipping Saturdays for now. He never increased the dose earlier this year.  He is encouraged to follow heart healthy lifestyle.

## 2024-01-02 NOTE — Progress Notes (Signed)
I,Victoria T Deloria Lair, CMA,acting as a Neurosurgeon for Gwynneth Aliment, MD.,have documented all relevant documentation on the behalf of Gwynneth Aliment, MD,as directed by  Gwynneth Aliment, MD while in the presence of Gwynneth Aliment, MD.  Subjective:  Patient ID: Marc [name] , male    DOB: 16-Jan-1951 , 73 y.o.   MRN: [id number]  Chief Complaint  Patient presents with   Hypertension   Hyperlipidemia    HPI  The patient is here today for blood pressure & cholesterol f/u.  He reports compliance with meds. He denies headaches, chest pain and shortness of breath. He is still exercising regularly.      Hypertension This is a chronic problem. The current episode started more than 1 year ago. The problem has been gradually improving since onset. The problem is controlled. Pertinent negatives include no blurred vision, chest pain, headaches or shortness of breath. Past treatments include angiotensin blockers. The current treatment provides moderate improvement. There are no compliance problems.   Hyperlipidemia This is a chronic problem. The current episode started more than 1 year ago. He has no history of diabetes. Pertinent negatives include no chest pain or shortness of breath. Current antihyperlipidemic treatment includes statins. There are no compliance problems.  Risk factors for coronary artery disease include hypertension, dyslipidemia and male sex.     Past Medical History:  Diagnosis Date   History of cocaine abuse (HCC)    Hypertension    Osteoarthritis      Family History  Problem Relation Age of Onset   Stroke Mother 77   Hypertension Mother    Healthy Father      Current Outpatient Medications:    atorvastatin (LIPITOR) 40 MG tablet, TAKE 1 TABLET BY MOUTH DAILY ON MONDAY THROUGH SATURDAY. SKIP SUNDAYS, Disp: 78 tablet, Rfl: 4   gabapentin (NEURONTIN) 300 MG capsule, TAKE 1 CAPSULE BY MOUTH EVERYDAY AT BEDTIME, Disp: 90 capsule, Rfl: 1   magnesium gluconate (MAGONATE) 500 MG  tablet, Take 500 mg by mouth daily., Disp: , Rfl:    telmisartan (MICARDIS) 20 MG tablet, Take 1 tablet (20 mg total) by mouth daily., Disp: 90 tablet, Rfl: 1   VITAMIN D, CHOLECALCIFEROL, PO, Take by mouth. 2 times per day, Disp: , Rfl:    XARELTO 10 MG TABS tablet, TAKE 1 TABLET BY MOUTH EVERY DAY, Disp: 30 tablet, Rfl: 5   promethazine-dextromethorphan (PROMETHAZINE-DM) 6.25-15 MG/5ML syrup, Take 5 mLs by mouth 2 (two) times daily as needed., Disp: 118 mL, Rfl: 0   triamcinolone cream (KENALOG) 0.1 %, APPLY TO AFFECTED AREA TWICE DAILY AS NEEDED (Patient not taking: Reported on 01/02/2024), Disp: 45 g, Rfl: 0   No Known Allergies   Review of Systems  Constitutional:  Positive for fatigue.       He reports feeling more tired recently. Denies fever/chills  HENT:  Positive for sore throat.        He c/o scratchy throat. Sx started yesterday. Also with night sweats. Denies having fever/chills. Works out regularly at [employer] No known ill contacts.   Eyes:  Negative for blurred vision.  Respiratory: Negative.  Negative for shortness of breath.   Cardiovascular: Negative.  Negative for chest pain.  Gastrointestinal: Negative.   Genitourinary: Negative.   Skin: Negative.   Allergic/Immunologic: Negative.   Neurological: Negative.  Negative for headaches.     Today's Vitals   01/02/24 1043  BP: 134/82  Temp: (!) 97.4 F (36.3 C)  SpO2: 98%  Weight: 236  lb 9.6 oz (107.3 kg)  Height: 6\' 2"  (1.88 m)   Body mass index is 30.38 kg/m.  Wt Readings from Last 3 Encounters:  01/10/24 227 lb (103 kg)  01/02/24 236 lb 9.6 oz (107.3 kg)  08/15/23 226 lb 6.6 oz (102.7 kg)     Objective:  Physical Exam Vitals and nursing note reviewed.  Constitutional:      Appearance: Normal appearance.  HENT:     Head: Normocephalic and atraumatic.     Mouth/Throat:     Pharynx: Posterior oropharyngeal erythema present. No oropharyngeal exudate.     Comments: Slight erythema Cardiovascular:     Rate  and Rhythm: Normal rate and regular rhythm.     Heart sounds: Normal heart sounds.  Pulmonary:     Effort: Pulmonary effort is normal.     Breath sounds: Normal breath sounds.  Skin:    General: Skin is warm.  Neurological:     General: No focal deficit present.     Mental Status: He is alert.  Psychiatric:        Mood and Affect: Mood normal.         Assessment And Plan:  Hypertensive heart disease without heart failure Assessment & Plan: Chronic, fair control.   Goal BP<120/80.  He is feeling under the weather today. No med changes. He will continue with telmisartan 20mg  daily. He will f/u in 4-6 months for re-evaluation.   Orders: -     CMP14+EGFR  Coronary artery disease of native artery of native heart with stable angina pectoris (HCC) Assessment & Plan: Chronic, LDL goal<70.  He will continue with atorvastatin 40mg  Sun-Friday, skipping Saturdays for now. He never increased the dose earlier this year.  He is encouraged to follow heart healthy lifestyle.    Pure hypercholesterolemia Assessment & Plan: Chronic, LDL goal is less than 70. He will continue with atorvastatin 40mg  daily Monday thru Saturday dosing. He is encouraged to follow heart healthy lifestyle.   Orders: -     CMP14+EGFR  Sore throat Assessment & Plan: He was given samples of Coricidin HBP to take prn. He is encouraged to let me know if he develops fever/chills.    Elevated PSA Assessment & Plan: He is now followed by Urology. He is scheduled for MR prostate tomorrow.    Class 1 obesity due to excess calories without serious comorbidity with body mass index (BMI) of 30.0 to 30.9 in adult Assessment & Plan: He has gained 10 pounds since September 2024.  He states weight gain is because he [facility] shoes on[facility]    He is encouraged to strive for BMI less than 30 to decrease cardiac risk. Advised to aim for at least 150 minutes of exercise per week.    Return if symptoms  worsen or fail to improve.  Patient was given opportunity to ask questions. Patient verbalized understanding of the plan and was able to repeat key elements of the plan. All questions were answered to their satisfaction.    I, Gwynneth Aliment, MD, have reviewed all documentation for this visit. The documentation on 01/02/24 for the exam, diagnosis, procedures, and orders are all accurate and complete.   IF YOU HAVE BEEN REFERRED TO A SPECIALIST, IT MAY TAKE 1-2 WEEKS TO SCHEDULE/PROCESS THE REFERRAL. IF YOU HAVE NOT HEARD FROM US/SPECIALIST IN TWO WEEKS, PLEASE GIVE [place] A CALL AT [phone] X [phone].   THE PATIENT IS ENCOURAGED TO PRACTICE SOCIAL DISTANCING DUE  TO THE COVID-19 PANDEMIC.

## 2024-01-02 NOTE — Assessment & Plan Note (Signed)
 Chronic, fair control.   Goal BP<120/80.  He is feeling under the weather today. No med changes. He will continue with telmisartan  20mg  daily. He will f/u in 4-6 months for re-evaluation.

## 2024-01-02 NOTE — Patient Instructions (Signed)
 Hypertension, Adult Hypertension is another name for high blood pressure. High blood pressure forces your heart to work harder to pump blood. This can cause problems over time. There are two numbers in a blood pressure reading. There is a top number (systolic) over a bottom number (diastolic). It is best to have a blood pressure that is below 120/80. What are the causes? The cause of this condition is not known. Some other conditions can lead to high blood pressure. What increases the risk? Some lifestyle factors can make you more likely to develop high blood pressure: Smoking. Not getting enough exercise or physical activity. Being overweight. Having too much fat, sugar, calories, or salt (sodium) in your diet. Drinking too much alcohol. Other risk factors include: Having any of these conditions: Heart disease. Diabetes. High cholesterol. Kidney disease. Obstructive sleep apnea. Having a family history of high blood pressure and high cholesterol. Age. The risk increases with age. Stress. What are the signs or symptoms? High blood pressure may not cause symptoms. Very high blood pressure (hypertensive crisis) may cause: Headache. Fast or uneven heartbeats (palpitations). Shortness of breath. Nosebleed. Vomiting or feeling like you may vomit (nauseous). Changes in how you see. Very bad chest pain. Feeling dizzy. Seizures. How is this treated? This condition is treated by making healthy lifestyle changes, such as: Eating healthy foods. Exercising more. Drinking less alcohol. Your doctor may prescribe medicine if lifestyle changes do not help enough and if: Your top number is above 130. Your bottom number is above 80. Your personal target blood pressure may vary. Follow these instructions at home: Eating and drinking  If told, follow the DASH eating plan. To follow this plan: Fill one half of your plate at each meal with fruits and vegetables. Fill one fourth of your plate  at each meal with whole grains. Whole grains include whole-wheat pasta, brown rice, and whole-grain bread. Eat or drink low-fat dairy products, such as skim milk or low-fat yogurt. Fill one fourth of your plate at each meal with low-fat (lean) proteins. Low-fat proteins include fish, chicken without skin, eggs, beans, and tofu. Avoid fatty meat, cured and processed meat, or chicken with skin. Avoid pre-made or processed food. Limit the amount of salt in your diet to less than 1,500 mg each day. Do not drink alcohol if: Your doctor tells you not to drink. You are pregnant, may be pregnant, or are planning to become pregnant. If you drink alcohol: Limit how much you have to: 0-1 drink a day for women. 0-2 drinks a day for men. Know how much alcohol is in your drink. In the [place] one drink equals one 12 oz bottle of beer (355 mL), one 5 oz glass of wine (148 mL), or one 1 oz glass of hard liquor (44 mL). Lifestyle  Work with your doctor to stay at a healthy weight or to lose weight. Ask your doctor what the best weight is for you. Get at least 30 minutes of exercise that causes your heart to beat faster (aerobic exercise) most days of the week. This may include walking, swimming, or biking. Get at least 30 minutes of exercise that strengthens your muscles (resistance exercise) at least 3 days a week. This may include lifting weights or doing Pilates. Do not smoke or use any products that contain nicotine or tobacco. If you need help quitting, ask your doctor. Check your blood pressure at home as told by your doctor. Keep all follow-up visits. Medicines Take over-the-counter and prescription medicines  only as told by your doctor. Follow directions carefully. Do not skip doses of blood pressure medicine. The medicine does not work as well if you skip doses. Skipping doses also puts you at risk for problems. Ask your doctor about side effects or reactions to medicines that you should watch  for. Contact a doctor if: You think you are having a reaction to the medicine you are taking. You have headaches that keep coming back. You feel dizzy. You have swelling in your ankles. You have trouble with your vision. Get help right away if: You get a very bad headache. You start to feel mixed up (confused). You feel weak or numb. You feel faint. You have very bad pain in your: Chest. Belly (abdomen). You vomit more than once. You have trouble breathing. These symptoms may be an emergency. Get help right away. Call 911. Do not wait to see if the symptoms will go away. Do not drive yourself to the hospital. Summary Hypertension is another name for high blood pressure. High blood pressure forces your heart to work harder to pump blood. For most people, a normal blood pressure is less than 120/80. Making healthy choices can help lower blood pressure. If your blood pressure does not get lower with healthy choices, you may need to take medicine. This information is not intended to replace advice given to you by your health care provider. Make sure you discuss any questions you have with your health care provider. Document Revised: 09/01/2021 Document Reviewed: 09/01/2021 Elsevier Patient Education  2024 [employer].

## 2024-01-02 NOTE — Assessment & Plan Note (Signed)
 He has gained 10 pounds since September 2024.  He states weight gain is because he kept shoes on[facility]

## 2024-01-02 NOTE — Assessment & Plan Note (Signed)
 He is now followed by Urology. He is scheduled for MR prostate tomorrow.

## 2024-01-03 ENCOUNTER — Ambulatory Visit
Admission: RE | Admit: 2024-01-03 | Discharge: 2024-01-03 | Disposition: A | Discharge status: Home / Self Care | Payer: PPO | Source: Ambulatory Visit | Attending: Urology | Admitting: Urology

## 2024-01-03 DIAGNOSIS — R972 Elevated prostate specific antigen [PSA]: Secondary | ICD-10-CM | POA: Diagnosis not present

## 2024-01-03 MED ORDER — GADOPICLENOL 0.5 MMOL/ML IV SOLN
10.0000 mL | Freq: Once | INTRAVENOUS | Status: AC | PRN
  Administered 2024-01-03: 10 mL via INTRAVENOUS

## 2024-01-10 ENCOUNTER — Encounter: Payer: Self-pay | Admitting: Family Medicine

## 2024-01-10 ENCOUNTER — Ambulatory Visit (INDEPENDENT_AMBULATORY_CARE_PROVIDER_SITE_OTHER): Payer: PPO | Admitting: Family Medicine

## 2024-01-10 VITALS — BP 130/76 | HR 65 | Temp 97.9°F | Ht 74.0 in | Wt 227.0 lb

## 2024-01-10 DIAGNOSIS — J101 Influenza due to other identified influenza virus with other respiratory manifestations: Secondary | ICD-10-CM | POA: Diagnosis not present

## 2024-01-10 DIAGNOSIS — R051 Acute cough: Secondary | ICD-10-CM

## 2024-01-10 DIAGNOSIS — R6883 Chills (without fever): Secondary | ICD-10-CM | POA: Diagnosis not present

## 2024-01-10 LAB — POC SOFIA 2 FLU + SARS ANTIGEN FIA
Influenza A, POC: POSITIVE — AB
Influenza B, POC: NEGATIVE
SARS Coronavirus 2 Ag: NEGATIVE

## 2024-01-10 MED ORDER — PROMETHAZINE-DM 6.25-15 MG/5ML PO SYRP: 5.0000 mL | ORAL_SOLUTION | Freq: Two times a day (BID) | ORAL | 0 refills | Status: DC | PRN

## 2024-01-10 MED ORDER — XOFLUZA (80 MG DOSE) 1 X 80 MG PO TBPK: 1.0000 mg | ORAL_TABLET | Freq: Once | ORAL | 0 refills | Status: AC

## 2024-01-10 NOTE — Progress Notes (Signed)
I,Jameka J Llittleton, CMA,acting as a Neurosurgeon for [employer], NP.,have documented all relevant documentation on the behalf of Ellender Hose, NP,as directed by  Ellender Hose, NP while in the presence of Ellender Hose, NP.  Subjective:  Patient ID: Marc [name] , male    DOB: July 24, 1951 , 73 y.o.   MRN: [id number]  Chief Complaint  Patient presents with   URI    HPI  Patient is a 73 year old male that  presents today for cold symptoms. He reports that his symptoms started last week but got worse on Monday. He lists symptoms of body aches, chills, a.dry cough that makes his chest hurt, fatigue , headache, tiredness.  URI  This is a new problem. The current episode started in the past 7 days. The problem has been unchanged. There has been no fever. Associated symptoms include congestion, coughing and headaches. Associated symptoms comments: Chills and bodyaches. Treatments tried: coricidin. The treatment provided no relief.     Past Medical History:  Diagnosis Date   History of cocaine abuse (HCC)    Hypertension    Osteoarthritis      Family History  Problem Relation Age of Onset   Stroke Mother 70   Hypertension Mother    Healthy Father      Current Outpatient Medications:    Baloxavir Marboxil,80 MG Dose, (XOFLUZA, 80 MG DOSE,) 1 x 80 MG TBPK, Take 1 mg by mouth once for 1 dose., Disp: 1 each, Rfl: 0   promethazine-dextromethorphan (PROMETHAZINE-DM) 6.25-15 MG/5ML syrup, Take 5 mLs by mouth 2 (two) times daily as needed., Disp: 118 mL, Rfl: 0   atorvastatin (LIPITOR) 40 MG tablet, TAKE 1 TABLET BY MOUTH DAILY ON MONDAY THROUGH SATURDAY. SKIP SUNDAYS, Disp: 78 tablet, Rfl: 4   gabapentin (NEURONTIN) 300 MG capsule, TAKE 1 CAPSULE BY MOUTH EVERYDAY AT BEDTIME, Disp: 90 capsule, Rfl: 1   magnesium gluconate (MAGONATE) 500 MG tablet, Take 500 mg by mouth daily., Disp: , Rfl:    telmisartan (MICARDIS) 20 MG tablet, Take 1 tablet (20 mg total) by mouth daily., Disp: 90 tablet, Rfl: 1    triamcinolone cream (KENALOG) 0.1 %, APPLY TO AFFECTED AREA TWICE DAILY AS NEEDED (Patient not taking: Reported on 01/02/2024), Disp: 45 g, Rfl: 0   VITAMIN D, CHOLECALCIFEROL, PO, Take by mouth. 2 times per day, Disp: , Rfl:    XARELTO 10 MG TABS tablet, TAKE 1 TABLET BY MOUTH EVERY DAY, Disp: 30 tablet, Rfl: 5   No Known Allergies   Review of Systems  Constitutional:  Positive for chills and fatigue.  HENT:  Positive for congestion.   Eyes: Negative.   Respiratory:  Positive for cough.   Cardiovascular:  Negative for palpitations and leg swelling.  Musculoskeletal:  Positive for arthralgias.  Skin: Negative.   Neurological:  Positive for headaches.     Today's Vitals   01/10/24 1538  BP: 130/76  Pulse: 65  Temp: 97.9 F (36.6 C)  TempSrc: Oral  Weight: 227 lb (103 kg)  Height: 6\' 2"  (1.88 m)   Body mass index is 29.15 kg/m.  Wt Readings from Last 3 Encounters:  01/10/24 227 lb (103 kg)  01/02/24 236 lb 9.6 oz (107.3 kg)  08/15/23 226 lb 6.6 oz (102.7 kg)    The 10-year ASCVD risk score (Arnett DK, et al., 2019) is: 17.8%   Values used to calculate the score:     Age: 53 years     Sex: Male     Is  Non-Hispanic African American: Yes     Diabetic: No     Tobacco smoker: No     Systolic Blood Pressure: 130 mmHg     Is BP treated: Yes     HDL Cholesterol: 62 mg/dL     Total Cholesterol: 142 mg/dL  Objective:  Physical Exam Constitutional:      Appearance: Normal appearance.  HENT:     Head: Normocephalic.  Cardiovascular:     Rate and Rhythm: Normal rate and regular rhythm.  Pulmonary:     Effort: Pulmonary effort is normal. No respiratory distress.  Abdominal:     General: Bowel sounds are normal.  Neurological:     General: No focal deficit present.     Mental Status: He is alert and oriented to person, place, and time. Mental status is at baseline.  Psychiatric:        Mood and Affect: Mood normal.         Assessment And Plan:  Influenza A -      Xofluza (80 MG Dose); Take 1 mg by mouth once for 1 dose.  Dispense: 1 each; Refill: 0  Chills -     POC SOFIA 2 FLU + SARS ANTIGEN FIA  Acute cough -     Promethazine-DM; Take 5 mLs by mouth 2 (two) times daily as needed.  Dispense: 118 mL; Refill: 0    Return if symptoms worsen or fail to improve.  Patient was given opportunity to ask questions. Patient verbalized understanding of the plan and was able to repeat key elements of the plan. All questions were answered to their satisfaction.   I, Ellender Hose, NP, have reviewed all documentation for this visit. The documentation on 01/10/2024 for the exam, diagnosis, procedures, and orders are all accurate and complete.     IF YOU HAVE BEEN REFERRED TO A SPECIALIST, IT MAY TAKE 1-2 WEEKS TO SCHEDULE/PROCESS THE REFERRAL. IF YOU HAVE NOT HEARD FROM US/SPECIALIST IN TWO WEEKS, PLEASE GIVE [place] A CALL AT [phone].

## 2024-01-12 NOTE — Assessment & Plan Note (Signed)
 Chronic, LDL goal is less than 70. He will continue with atorvastatin 40mg  daily Monday thru Saturday dosing. He is encouraged to follow heart healthy lifestyle.

## 2024-01-12 NOTE — Assessment & Plan Note (Signed)
 He was given samples of Coricidin HBP to take prn. He is encouraged to let me know if he develops fever/chills.

## 2024-01-24 ENCOUNTER — Encounter: Payer: Self-pay | Admitting: Podiatry

## 2024-01-24 ENCOUNTER — Ambulatory Visit (INDEPENDENT_AMBULATORY_CARE_PROVIDER_SITE_OTHER): Payer: PPO

## 2024-01-24 ENCOUNTER — Telehealth: Payer: Self-pay

## 2024-01-24 ENCOUNTER — Ambulatory Visit: Payer: PPO | Admitting: Podiatry

## 2024-01-24 ENCOUNTER — Other Ambulatory Visit: Payer: Self-pay | Admitting: Physician Assistant

## 2024-01-24 VITALS — Ht 74.0 in | Wt 227.0 lb

## 2024-01-24 DIAGNOSIS — M2042 Other hammer toe(s) (acquired), left foot: Secondary | ICD-10-CM

## 2024-01-24 DIAGNOSIS — M21619 Bunion of unspecified foot: Secondary | ICD-10-CM

## 2024-01-24 DIAGNOSIS — M21612 Bunion of left foot: Secondary | ICD-10-CM

## 2024-01-24 DIAGNOSIS — M778 Other enthesopathies, not elsewhere classified: Secondary | ICD-10-CM | POA: Diagnosis not present

## 2024-01-24 DIAGNOSIS — I2699 Other pulmonary embolism without acute cor pulmonale: Secondary | ICD-10-CM

## 2024-01-24 NOTE — Telephone Encounter (Signed)
 Received surgery paperwork from Dr. Charlsie Merles. Left a message for Lemuel to call and schedule surgery.

## 2024-01-24 NOTE — Progress Notes (Signed)
 Subjective:   Patient ID: Marc [name], male   DOB: 73 y.o.   MRN: [id number]   HPI Patient presents stating he has a lot of problems with his left over his right foot and states he has had chronic bunion deformity hammertoe deformity and also deviation of the big toe left.  He has tried wider shoes he has tried trimming techniques without relief of symptoms and it is becoming more of an issue for him and he tries to be active does not smoke   Review of Systems  All other systems reviewed and are negative.       Objective:  Physical Exam Vitals and nursing note reviewed.  Constitutional:      Appearance: He is well-developed.  Pulmonary:     Effort: Pulmonary effort is normal.  Musculoskeletal:        General: Normal range of motion.  Skin:    General: Skin is warm.  Neurological:     Mental Status: He is alert.     Neurovascular status was found to be intact muscle strength was found to be adequate range of motion adequate with large hyperostosis medial aspect first metatarsal head left with history of surgery 50 years ago with deviation of the left big toe with frontal plane rotation hammertoe deformity second third and fourth toes with rigid contracture and history of fracture digit 3 with good digital perfusion well-oriented x 3.  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  Due to longstanding nature of condition failure to respond conservatively I have recommended a distal osteotomy left this is fixation proximal and did offer him Lapidus fusion but he would prefer to do this understanding remain active full correction along with Akin osteotomy with frontal plane correction and digital fusion digits 2 3 left distal  arthroplasty digit 4.  He read consent form going over alternative treatments complications and after review signed consent form scheduled for outpatient surgery.  Air fracture walker dispensed that I want him to get used to prior to surgery and find a shoe on the opposite foot that fits properly so that he can wear this during the postoperative recovery process and he understands he will have pins in the second and third digits for 5 weeks at least.  Total recovery approximate 6 months  X-rays indicate previous fixation of the left base of the first metatarsal with elevation of the intermetatarsal angle approximately 15 degrees rotation of the left hallux and rigid contracture digital deformity digits 234 left

## 2024-01-25 DIAGNOSIS — Z96652 Presence of left artificial knee joint: Secondary | ICD-10-CM | POA: Diagnosis not present

## 2024-01-29 ENCOUNTER — Telehealth: Payer: Self-pay | Admitting: *Deleted

## 2024-01-29 ENCOUNTER — Inpatient Hospital Stay: Payer: PPO | Attending: Internal Medicine

## 2024-01-29 ENCOUNTER — Inpatient Hospital Stay: Payer: PPO | Admitting: Physician Assistant

## 2024-01-29 VITALS — BP 135/83 | HR 70 | Temp 97.3°F | Resp 16 | Wt 230.1 lb

## 2024-01-29 DIAGNOSIS — Z86711 Personal history of pulmonary embolism: Secondary | ICD-10-CM | POA: Insufficient documentation

## 2024-01-29 DIAGNOSIS — Z7901 Long term (current) use of anticoagulants: Secondary | ICD-10-CM | POA: Diagnosis not present

## 2024-01-29 DIAGNOSIS — Z86718 Personal history of other venous thrombosis and embolism: Secondary | ICD-10-CM | POA: Insufficient documentation

## 2024-01-29 DIAGNOSIS — I2699 Other pulmonary embolism without acute cor pulmonale: Secondary | ICD-10-CM

## 2024-01-29 LAB — CBC WITH DIFFERENTIAL (CANCER CENTER ONLY)
Abs Immature Granulocytes: 0.01 10*3/uL (ref 0.00–0.07)
Basophils Absolute: 0 10*3/uL (ref 0.0–0.1)
Basophils Relative: 0 %
Eosinophils Absolute: 0.1 10*3/uL (ref 0.0–0.5)
Eosinophils Relative: 2 %
HCT: 43.3 % (ref 39.0–52.0)
Hemoglobin: 14.1 g/dL (ref 13.0–17.0)
Immature Granulocytes: 0 %
Lymphocytes Relative: 36 %
Lymphs Abs: 1.8 10*3/uL (ref 0.7–4.0)
MCH: 28.5 pg (ref 26.0–34.0)
MCHC: 32.6 g/dL (ref 30.0–36.0)
MCV: 87.7 fL (ref 80.0–100.0)
Monocytes Absolute: 0.5 10*3/uL (ref 0.1–1.0)
Monocytes Relative: 9 %
Neutro Abs: 2.8 10*3/uL (ref 1.7–7.7)
Neutrophils Relative %: 53 %
Platelet Count: 196 10*3/uL (ref 150–400)
RBC: 4.94 MIL/uL (ref 4.22–5.81)
RDW: 13.2 % (ref 11.5–15.5)
WBC Count: 5.2 10*3/uL (ref 4.0–10.5)
nRBC: 0 % (ref 0.0–0.2)

## 2024-01-29 LAB — CMP (CANCER CENTER ONLY)
ALT: 17 U/L (ref 0–44)
AST: 24 U/L (ref 15–41)
Albumin: 4 g/dL (ref 3.5–5.0)
Alkaline Phosphatase: 61 U/L (ref 38–126)
Anion gap: 4 — ABNORMAL LOW (ref 5–15)
BUN: 14 mg/dL (ref 8–23)
CO2: 30 mmol/L (ref 22–32)
Calcium: 9.5 mg/dL (ref 8.9–10.3)
Chloride: 104 mmol/L (ref 98–111)
Creatinine: 0.93 mg/dL (ref 0.61–1.24)
GFR, Estimated: >60 mL/min (ref >60)
Glucose, Bld: 82 mg/dL (ref 70–99)
Potassium: 4.6 mmol/L (ref 3.5–5.1)
Sodium: 138 mmol/L (ref 135–145)
Total Bilirubin: 0.8 mg/dL (ref 0.0–1.2)
Total Protein: 7.5 g/dL (ref 6.5–8.1)

## 2024-01-29 NOTE — Telephone Encounter (Signed)
 Contacted by Morrie Sheldon with [facility] (CB# [phone]  x [phone]) on behalf of Dr. Marlou Porch for Dr. Chilton Si, PA fax number. Marc [name] is scheduled for prostate biopsy next week. Morrie Sheldon requested confirmation that patient was taking Xarelto prescribed by Dr. Leonides Schanz and stated Dr. Marlou Porch needs provider's signed direction to hold Xarelto for 3 days prior to procedure.  Received faxed request to hold Xarelto 3 days prior to procedure in office. Form completed and signed by provider and faxed to [facility] at number provided on form. Fax confirmation received.  Contacted patient to inform him to hold Xarelto for 3 days prior to biopsy and that this office sent form to Dr. Marlou Porch advising same. Patient verbalized understanding of all information.

## 2024-01-29 NOTE — Progress Notes (Signed)
 [facility] [phone]   [phone]  PROGRESS NOTE  Patient Care Team: Dorothyann Peng, MD as PCP - General (Internal Medicine) Wendall Stade, MD as PCP - Cardiology (Cardiology)  Hematological/Oncological History # Bilateral Pulmonary Emboli # Bilateral Lower Extremity DVTs 06/24/2021: Patient underwent a CT coronary morphology with CTA and was incidentally found to have filling defects in the pulmonary arteries bilaterally consistent with pulmonary emboli.  Patient started on Xarelto therapy. 06/30/2021: Ultrasound of lower extremities bilaterally showed on the right side acute deep vein thrombosis involving the popliteal, right femoral vein as well as on the left side acute deep vein thrombosis involving the left femoral vein, left popliteal vein, and left peroneal veins 01/13/2022: Repeat lower extremity ultrasound showed recannulized thrombus in the left popliteal veins findings consistent with chronic deep vein thrombosis. 01/26/2022: Establish care with Dr. Leonides [name].  Interval History:  Marc [name] 73 y.o. male with medical history significant for bilateral pulmonary emboli and DVTs who presents for a follow up visit. The patient's last visit was on 08/03/2023. In the interim since the last visit he has continued on Xarelto therapy.  On exam today Marc [name] is doing well and tolerating Xarelto therapy without issues. He denies any bruising or signs of active bleeding including hematochezia or melena. He denies any issues with affording the medication. He denies any signs or symptoms of blood clot including peripheral edema, shortness of breath or chest pain. Overall he feels well and is willing and able to continue on Xarelto therapy at this time.  He otherwise denies any fevers, chills, sweats, nausea, vomiting or diarrhea.  Full 10 point ROS is listed below.  MEDICAL HISTORY:  Past Medical History:  Diagnosis Date   History of cocaine abuse (HCC)     Hypertension    Osteoarthritis     SURGICAL HISTORY: Past Surgical History:  Procedure Laterality Date   JOINT REPLACEMENT  06/22/11   KNEE ARTHROPLASTY Left 06/21/2011   Ollen Gross, MD    SOCIAL HISTORY: Social History   Socioeconomic History   Marital status: Single    Spouse name: Not on file   Number of children: 0   Years of education: [employer]   Highest education level: Bachelor's degree (e.g., BA, AB, BS)  Occupational History   Occupation: Scientist, forensic: UNEMPLOYED    Comment: since 2009   Occupation: retired  Tobacco Use   Smoking status: Never   Smokeless tobacco: Never  Vaping Use   Vaping status: Never Used  Substance and Sexual Activity   Alcohol use: Not Currently    Comment: occasional   Drug use: Not Currently    Comment: Last in Nov 2013   Sexual activity: Not Currently    Birth control/protection: Condom  Other Topics Concern   Not on file  Social History Narrative   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 [age] yo)   Social Drivers of Corporate investment banker Strain: Medium Risk (12/31/2023)   Overall Financial Resource Strain (CARDIA)    Difficulty of Paying Living Expenses: Somewhat hard  Food Insecurity: Food Insecurity Present (12/31/2023)   Hunger Vital Sign    Worried About Running Out of Food in the Last Year: Sometimes true    Ran Out of Food in the Last Year: Never true  Transportation Needs: No Transportation Needs (12/31/2023)   PRAPARE - Administrator, Civil Service (Medical): No    Lack of Transportation (Non-Medical): No  Physical Activity:  Sufficiently Active (12/31/2023)   Exercise Vital Sign    Days of Exercise per Week: 5 days    Minutes of Exercise per Session: 90 min  Stress: No Stress Concern Present (12/31/2023)   [employer] of Occupational Health - Occupational Stress Questionnaire    Feeling of Stress : Only a little  Social Connections: Moderately Isolated (12/31/2023)   Social Connection and  Isolation Panel [NHANES]    Frequency of Communication with Friends and Family: Three times a week    Frequency of Social Gatherings with Friends and Family: Once a week    Attends Religious Services: More than 4 times per year    Active Member of [employer] or Organizations: No    Attends Banker Meetings: Never    Marital Status: Never married  Intimate Partner Violence: Not At Risk (08/15/2023)   Humiliation, Afraid, Rape, and Kick questionnaire    Fear of Current or Ex-Partner: No    Emotionally Abused: No    Physically Abused: No    Sexually Abused: No    FAMILY HISTORY: Family History  Problem Relation Age of Onset   Stroke Mother 97   Hypertension Mother    Healthy Father     ALLERGIES:  has no known allergies.  MEDICATIONS:  Current Outpatient Medications  Medication Sig Dispense Refill   atorvastatin (LIPITOR) 40 MG tablet TAKE 1 TABLET BY MOUTH DAILY ON MONDAY THROUGH SATURDAY. SKIP SUNDAYS 78 tablet 4   gabapentin (NEURONTIN) 300 MG capsule TAKE 1 CAPSULE BY MOUTH EVERYDAY AT BEDTIME 90 capsule 1   magnesium gluconate (MAGONATE) 500 MG tablet Take 500 mg by mouth daily.     telmisartan (MICARDIS) 20 MG tablet Take 1 tablet (20 mg total) by mouth daily. 90 tablet 1   VITAMIN D, CHOLECALCIFEROL, PO Take by mouth daily.     XARELTO 10 MG TABS tablet TAKE 1 TABLET BY MOUTH EVERY DAY 30 tablet 5   promethazine-dextromethorphan (PROMETHAZINE-DM) 6.25-15 MG/5ML syrup Take 5 mLs by mouth 2 (two) times daily as needed. (Patient not taking: Reported on 01/29/2024) 118 mL 0   No current facility-administered medications for this visit.    REVIEW OF SYSTEMS:   Constitutional: ( - ) fevers, ( - )  chills , ( - ) night sweats Eyes: ( - ) blurriness of vision, ( - ) double vision, ( - ) watery eyes Ears, nose, mouth, throat, and face: ( - ) mucositis, ( - ) sore throat Respiratory: ( - ) cough, ( - ) dyspnea, ( - ) wheezes Cardiovascular: ( - ) palpitation, ( - ) chest  discomfort, ( - ) lower extremity swelling Gastrointestinal:  ( - ) nausea, ( - ) heartburn, ( - ) change in bowel habits Skin: ( - ) abnormal skin rashes Lymphatics: ( - ) new lymphadenopathy, ( - ) easy bruising Neurological: ( - ) numbness, ( - ) tingling, ( - ) new weaknesses Behavioral/Psych: ( - ) mood change, ( - ) new changes  All other systems were reviewed with the patient and are negative.  PHYSICAL EXAMINATION:  Vitals:   01/29/24 1127  BP: 135/83  Pulse: 70  Resp: 16  Temp: (!) 97.3 F (36.3 C)  SpO2: 100%    Filed Weights   01/29/24 1127  Weight: 230 lb 1.6 oz (104.4 kg)     GENERAL: Well-appearing elderly African-American male, alert, no distress and comfortable SKIN: skin color, texture, turgor are normal, no rashes or significant lesions EYES: conjunctiva are  pink and non-injected, sclera clear LUNGS: clear to auscultation and percussion with normal breathing effort HEART: regular rate & rhythm and no murmurs and no lower extremity edema on right leg, chronic swelling of left leg.  Musculoskeletal: no cyanosis of digits and no clubbing  PSYCH: alert & oriented x 3, fluent speech NEURO: no focal motor/sensory deficits  LABORATORY DATA:  I have reviewed the data as listed    Latest Ref Rng & Units 01/29/2024   11:13 AM 08/03/2023   11:01 AM 07/02/2023    3:03 PM  CBC  WBC 4.0 - 10.5 K/uL 5.2  4.6  6.2   Hemoglobin 13.0 - 17.0 g/dL 14.7  82.9  56.2   Hematocrit 39.0 - 52.0 % 43.3  41.8  44.1   Platelets 150 - 400 K/uL 196  165  CANCELED        Latest Ref Rng & Units 01/02/2024   11:39 AM 08/03/2023   11:01 AM 07/02/2023    3:03 PM  CMP  Glucose 70 - 99 mg/dL 76  130  79   BUN 8 - 27 mg/dL 21  16  15    Creatinine 0.76 - 1.27 mg/dL 8.65  7.84  6.96   Sodium 134 - 144 mmol/L 137  136  138   Potassium 3.5 - 5.2 mmol/L 4.5  4.3  4.5   Chloride 96 - 106 mmol/L 103  105  103   CO2 20 - 29 mmol/L 21  27  23    Calcium 8.6 - 10.2 mg/dL 9.1  9.1  9.4   Total  Protein 6.0 - 8.5 g/dL 7.0  7.2  7.2   Total Bilirubin 0.0 - 1.2 mg/dL 0.4  0.7  0.5   Alkaline Phos 44 - 121 IU/L 77  62  82   AST 0 - 40 IU/L 27  40  27   ALT 0 - 44 IU/L 18  26  23      RADIOGRAPHIC STUDIES: DG Foot 2 Views Left Result Date: 01/24/2024 Please see detailed radiograph report in office note.  DG Ankle 2 Views Left Result Date: 01/24/2024 Please see detailed radiograph report in office note.  MR PROSTATE W WO CONTRAST Result Date: 01/04/2024 CLINICAL DATA:  Elevated PSA level.  R97.20 EXAM: MR PROSTATE WITHOUT AND WITH CONTRAST TECHNIQUE: Multiplanar multisequence MRI images were obtained of the pelvis centered about the prostate. Pre and post contrast images were obtained. CONTRAST:  10 cc Vueway COMPARISON:  None Available. FINDINGS: Prostate: Encapsulated nodularity in the transition zone compatible with benign prostatic hypertrophy. Region of interest # 1: PI-RADS category 4 lesion of the right posteromedial peripheral zone in the mid gland and apex with focally reduced T2 signal (image 46 series 9) corresponding to reduced ADC map activity and restricted diffusion (image 15 series 6 and 7) and focal early enhancement (image 181 series 12). This measures 0.43 cc (1.0 by 0.7 by 0.9 cm). Volume: 3D volumetric assessment: Prostate volume 53.1 cc (4.9 by 4.3 by 5.0 cm). Transcapsular spread: Absent Seminal vesicle involvement: Absent Neurovascular bundle involvement: Absent Pelvic adenopathy: Absent Bone metastasis: Absent Other findings: Spurring anteriorly along both sacroiliac joints. Mildly fatty right spermatic cord. Low-level degenerative enhancement along the iliac side of both sacroiliac joints compatible with arthropathy. IMPRESSION: 1. PI-RADS category 4 lesion of the right posteromedial peripheral zone. Targeting data sent to [facility]. 2. Mild prostatomegaly and benign prostatic hypertrophy. 3. Mild bilateral sacroiliac arthropathy. Electronically Signed   By: Annitta Needs.D.  On: 01/04/2024 13:04    ASSESSMENT & PLAN Marc [name] is a 73 y.o.. male with medical history significant for bilateral pulmonary emboli and DVTs who presents for a follow up visit.  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   A provoked venous thromboembolism (VTE) is one that has a clear inciting factor or event. Provoking factors include prolonged travel/immobility, surgery (particular abdominal or orthropedic), trauma,  and pregnancy/ estrogen containing birth control. After a detailed history and review of the records there is no clear provoking factor for this patient's VTE.  Patients with unprovoked VTEs have up to 25% recurrence after 5 years and 36% at 10 years, with 4% of these clots being fatal (BMJ?2019;366:l4363). Therefore the formal recommendation for unprovoked VTE's is lifelong anticoagulation, as the cause may not be transient or reversible. We recommend 6 months or full strength anticoagulation with a re-evaluation after that time.  The patient's will then have a choice of maintenance dose DOAC (preferred, recommended), 81mg  ASA PO daily (non-preferred), or no further anticoagulation (not recommended).     # Unprovoked DVT/Pulmonary Embolism  --findings at this time are consistent with a unprovoked VTE  --ruled out APS with anticardiolipin and anti beta2 glycoprotein antibodies.  Lupus anticoagulant panel would be altered by presence of blood thinner, will hold on this testing.   --Labs today show white blood cell count 5.2, hemoglobin 14.1, MCV 87.7, and platelets of 196. Creatinine and LFTs normal.  --recommend the patient continue Xarelto 10 mg p.o. daily for maintenance therapy. --patient denies any bleeding, bruising, or dark stools on this medication. It is well tolerated. No difficulties accessing/affording the medication  --RTC  in 6 months' time with strict return precautions for overt signs of bleeding.    No orders of the defined types were placed in this encounter.   All questions were answered. The patient knows to call the clinic with any problems, questions or concerns.  A total of more than 25 minutes were spent on this encounter with face-to-face time and non-face-to-face time, including preparing to see the patient, ordering tests and/or medications, counseling the patient and coordination of care as outlined above.   Georga Kaufmann PA-C Dept of Hematology and Oncology [facility] Phone: [phone]   01/29/2024 11:44 AM

## 2024-01-31 ENCOUNTER — Telehealth: Payer: Self-pay

## 2024-01-31 NOTE — Telephone Encounter (Signed)
 Spoke with Tammy from [facility].  She stated that Xarelto confirmation form was not received by their office.  Resent to fax number [phone].  Fax confirmed.

## 2024-02-05 DIAGNOSIS — C61 Malignant neoplasm of prostate: Secondary | ICD-10-CM | POA: Diagnosis not present

## 2024-02-05 DIAGNOSIS — R972 Elevated prostate specific antigen [PSA]: Secondary | ICD-10-CM | POA: Diagnosis not present

## 2024-02-06 ENCOUNTER — Telehealth: Payer: Self-pay | Admitting: Urology

## 2024-02-06 NOTE — Telephone Encounter (Signed)
 DOS 02/19/24   Quintella Reichert OSTEOTOMY LEFT --- [id number] Serafina Royals LEFT --- [id number] [place] REPAIR 2-4 LEFT --- [id number]  HTA  SPOKE WITH HAYLEY WITH [facility] AND SHE STATED THAT FOR CPT CODES [id number], [id number] AND [phone] NO PRIOR AUTH IS REQUIRED.  [facility] REF # HAYLEY M. 02/06/24 AT 8:22 AM EST

## 2024-02-11 ENCOUNTER — Other Ambulatory Visit (HOSPITAL_COMMUNITY): Payer: Self-pay | Admitting: Urology

## 2024-02-11 DIAGNOSIS — C61 Malignant neoplasm of prostate: Secondary | ICD-10-CM

## 2024-02-12 DIAGNOSIS — C61 Malignant neoplasm of prostate: Secondary | ICD-10-CM | POA: Diagnosis not present

## 2024-02-14 ENCOUNTER — Telehealth: Payer: Self-pay | Admitting: Podiatry

## 2024-02-14 NOTE — Telephone Encounter (Signed)
 Pt called surgery center to cxl appt. Cynthia lvm for [place].  We called pt to confirm he was canceling his surgery for 3/25. He confirmed he needed to cxl appt due to a recent dx of prostate cancer. Surgery has been canceled with surgery center as well.

## 2024-02-19 ENCOUNTER — Other Ambulatory Visit: Payer: Self-pay | Admitting: Internal Medicine

## 2024-02-20 ENCOUNTER — Telehealth: Payer: Self-pay | Admitting: Radiation Oncology

## 2024-02-20 NOTE — Telephone Encounter (Signed)
 Left message for patient to call back to schedule consult per 3/18 referral.

## 2024-02-25 ENCOUNTER — Encounter: Admitting: Podiatry

## 2024-02-26 DIAGNOSIS — C61 Malignant neoplasm of prostate: Secondary | ICD-10-CM | POA: Diagnosis not present

## 2024-02-27 ENCOUNTER — Encounter: Payer: Self-pay | Admitting: Radiation Oncology

## 2024-02-27 NOTE — Progress Notes (Signed)
 GU Location of Tumor / Histology: Prostate Ca  If Prostate Cancer, Gleason Score is (4 + 4) and PSA is (7.32 on 09/18/2023).  Marc [name] presented as referral from Dr. Berniece Salines [facility]) elevated PSA.  Biopsies     02/28/2024 Dr. Berniece Salines NM PET ([facility]) Skull to Mid Thigh Clinical Data:  Prostate carcinoma. High risk staging.   IMPRESSION: 1. Focal activity in the inferior posterior RIGHT lobe of the prostate gland consistent with primary prostate adenocarcinoma. 2. No evidence of metastatic adenopathy in the pelvis or periaortic retroperitoneum. 3. No evidence of visceral metastasis or skeletal metastasis.    11/15/2023 Dr. Berniece Salines MR Prostate with/without Contrast CLINICAL DATA:  Elevated PSA level. R97.20   IMPRESSION: 1. PI-RADS category 4 lesion of the right posteromedial peripheral zone. Targeting data sent to [facility]. 2. Mild prostatomegaly and benign prostatic hypertrophy. 3. Mild bilateral sacroiliac arthropathy.    Past/Anticipated interventions by urology, if any:    Past/Anticipated interventions by medical oncology, if any:  NA  Weight changes, if any:  No  IPSS:  7 SHIM:  12  Bowel/Bladder complaints, if any: Urinary frequency.  No bowel issues.    Nausea/Vomiting, if any:  No  Pain issues, if any:  0/10  SAFETY ISSUES: Prior radiation? No Pacemaker/ICD?  No Possible current pregnancy? Male Is the patient on methotrexate? No  Current Complaints / other details:

## 2024-02-28 ENCOUNTER — Encounter (HOSPITAL_COMMUNITY)
Admission: RE | Admit: 2024-02-28 | Discharge: 2024-02-28 | Disposition: A | Discharge status: Home / Self Care | Source: Ambulatory Visit | Attending: Urology | Admitting: Urology

## 2024-02-28 DIAGNOSIS — C61 Malignant neoplasm of prostate: Secondary | ICD-10-CM | POA: Diagnosis not present

## 2024-02-28 MED ORDER — FLOTUFOLASTAT F 18 GALLIUM 296-5846 MBQ/ML IV SOLN
8.1000 | Freq: Once | INTRAVENOUS | Status: AC
  Administered 2024-02-28: 8.1 via INTRAVENOUS

## 2024-03-03 NOTE — Progress Notes (Signed)
 STAT read requested on [facility] PET for upcoming consult.

## 2024-03-03 NOTE — Progress Notes (Signed)
 Radiation Oncology         [phone] ________________________________  Initial Outpatient Consultation  Name: Marc [name] MRN: [id number]  Date: 03/04/2024  DOB: 1951/06/14  NW:[id number], Melina Schools, MD  Crist Fat, MD   REFERRING PHYSICIAN: Crist Fat, MD  DIAGNOSIS: 73 y.o. gentleman with Stage T1c adenocarcinoma of the prostate with Gleason score of 4+4, and PSA of 7.32.    ICD-10-CM   1. Malignant neoplasm of prostate (HCC)  C61       HISTORY OF PRESENT ILLNESS: Marc [name] is a 73 y.o. male with a diagnosis of prostate cancer. He has has a longstanding history of BPH, followed by Dr. Marlou Porch since approximately 2019 for mild LUTS, ED, and an elevated PSA since 2021. His PSA has fluctuated over the years, between 3.5-7.2.  His PSA was 5.29 in October 2024.  He underwent a prostate MRI on 01/03/24 showing a PI-RADS 4 lesion in the right posteromedial peripheral zone. The patient proceeded to MRI fusion biopsy of the prostate on 02/05/24 and a repeat PSA obtained that day remained elevated at 7.32. The prostate volume measured 49 cc.  Out of 15 core biopsies, 12 were positive.  The maximum Gleason score was 4+4, and this was seen in all three samples from the MRI ROI. Additionally, Gleason 4+3 was seen in the right mid lateral, Gleason 3+4 in the right base lateral (with perineural invasion), right base (with PNI and cribriform glands), left apex, and left mid, and Gleason 3+3 in the left apex lateral (two foci), left base, right mid, and right mid lateral (small focus).  He underwent staging PSMA PET scan on 02/28/24 showing no evidence of disease outside of the prostate.  The patient reviewed the biopsy results with his urologist and was started on Orgovyx ADT last week and has kindly been referred today for discussion of potential radiation treatment options.   PREVIOUS RADIATION THERAPY: No  PAST MEDICAL HISTORY:  Past Medical History:  Diagnosis Date   Elevated PSA     History of cocaine abuse (HCC)    Hypertension    Osteoarthritis       PAST SURGICAL HISTORY: Past Surgical History:  Procedure Laterality Date   JOINT REPLACEMENT  06/22/11   KNEE ARTHROPLASTY Left 06/21/2011   Ollen Gross, MD   PROSTATE BIOPSY      FAMILY HISTORY:  Family History  Problem Relation Age of Onset   Stroke Mother 68   Hypertension Mother    Healthy Father     SOCIAL HISTORY:  Social History   Socioeconomic History   Marital status: Single    Spouse name: Not on file   Number of children: 0   Years of education: [employer]   Highest education level: Bachelor's degree (e.g., BA, AB, BS)  Occupational History   Occupation: Scientist, forensic: UNEMPLOYED    Comment: since 2009   Occupation: retired  Tobacco Use   Smoking status: Never   Smokeless tobacco: Never  Vaping Use   Vaping status: Never Used  Substance and Sexual Activity   Alcohol use: Not Currently    Comment: occasional   Drug use: Not Currently    Comment: Last in Nov 2013   Sexual activity: Not Currently    Birth control/protection: Condom  Other Topics Concern   Not on file  Social History Narrative   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 73 yo)   Social Drivers of Corporate investment banker Strain: Medium Risk (12/31/2023)  Overall Financial Resource Strain (CARDIA)    Difficulty of Paying Living Expenses: Somewhat hard  Food Insecurity: Food Insecurity Present (03/04/2024)   Hunger Vital Sign    Worried About Running Out of Food in the Last Year: Sometimes true    Ran Out of Food in the Last Year: Sometimes true  Transportation Needs: No Transportation Needs (03/04/2024)   PRAPARE - Administrator, Civil Service (Medical): No    Lack of Transportation (Non-Medical): No  Physical Activity: Sufficiently Active (12/31/2023)   Exercise Vital Sign    Days of Exercise per Week: 5 days    Minutes of Exercise per Session: 90 min  Stress: No Stress Concern Present  (12/31/2023)   [employer] of Occupational Health - Occupational Stress Questionnaire    Feeling of Stress : Only a little  Social Connections: Moderately Isolated (12/31/2023)   Social Connection and Isolation Panel [NHANES]    Frequency of Communication with Friends and Family: Three times a week    Frequency of Social Gatherings with Friends and Family: Once a week    Attends Religious Services: More than 4 times per year    Active Member of [employer] or Organizations: No    Attends Banker Meetings: Never    Marital Status: Never married  Intimate Partner Violence: Not At Risk (03/04/2024)   Humiliation, Afraid, Rape, and Kick questionnaire    Fear of Current or Ex-Partner: No    Emotionally Abused: No    Physically Abused: No    Sexually Abused: No    ALLERGIES: Patient has no known allergies.  MEDICATIONS:  Current Outpatient Medications  Medication Sig Dispense Refill   ORGOVYX 120 MG tablet Take 120 mg by mouth daily.     atorvastatin (LIPITOR) 40 MG tablet TAKE 1 TABLET BY MOUTH DAILY ON MONDAY THROUGH SATURDAY. SKIP SUNDAYS 78 tablet 4   gabapentin (NEURONTIN) 300 MG capsule TAKE 1 CAPSULE BY MOUTH EVERYDAY AT BEDTIME 90 capsule 1   magnesium gluconate (MAGONATE) 500 MG tablet Take 500 mg by mouth daily.     telmisartan (MICARDIS) 20 MG tablet TAKE 1 TABLET BY MOUTH EVERY DAY 90 tablet 1   VITAMIN D, CHOLECALCIFEROL, PO Take by mouth daily.     XARELTO 10 MG TABS tablet TAKE 1 TABLET BY MOUTH EVERY DAY 30 tablet 5   No current facility-administered medications for this encounter.    REVIEW OF SYSTEMS:  On review of systems, the patient reports that he is doing well overall. He denies any chest pain, shortness of breath, cough, fevers, chills, night sweats, unintended weight changes. He denies any bowel disturbances, and denies abdominal pain, nausea or vomiting. He denies any new musculoskeletal or joint aches or pains. His IPSS was 7, indicating mild urinary  symptoms. His SHIM was 12, indicating he has moderate-severe erectile dysfunction. A complete review of systems is obtained and is otherwise negative.    PHYSICAL EXAM:  Wt Readings from Last 3 Encounters:  03/04/24 231 lb (104.8 kg)  01/29/24 230 lb 1.6 oz (104.4 kg)  01/24/24 227 lb (103 kg)   Temp Readings from Last 3 Encounters:  03/04/24 (!) 97 F (36.1 C)  01/29/24 (!) 97.3 F (36.3 C) (Temporal)  01/10/24 97.9 F (36.6 C) (Oral)   BP Readings from Last 3 Encounters:  03/04/24 (!) 166/90  01/29/24 135/83  01/10/24 130/76   Pulse Readings from Last 3 Encounters:  03/04/24 97  01/29/24 70  01/10/24 65   Pain Assessment  Pain Score: 0-No pain/10  In general this is a very fit, well appearing African-American male in no acute distress.  He appears younger than his stated age.  He's alert and oriented x4 and appropriate throughout the examination. Cardiopulmonary assessment is negative for acute distress, and he exhibits normal effort.    KPS = 100  100 - Normal; no complaints; no evidence of disease. 90   - Able to carry on normal activity; minor signs or symptoms of disease. 80   - Normal activity with effort; some signs or symptoms of disease. 25   - Cares for self; unable to carry on normal activity or to do active work. 60   - Requires occasional assistance, but is able to care for most of his personal needs. 50   - Requires considerable assistance and frequent medical care. 40   - Disabled; requires special care and assistance. 30   - Severely disabled; hospital admission is indicated although death not imminent. 20   - Very sick; hospital admission necessary; active supportive treatment necessary. 10   - Moribund; fatal processes progressing rapidly. 0     - Dead  Karnofsky DA, Abelmann WH, Craver LS and Burchenal [facility] [phone]) The use of the nitrogen mustards in the palliative treatment of carcinoma: with particular reference to bronchogenic carcinoma Cancer 1  634-56  LABORATORY DATA:  Lab Results  Component Value Date   WBC 5.2 01/29/2024   HGB 14.1 01/29/2024   HCT 43.3 01/29/2024   MCV 87.7 01/29/2024   PLT 196 01/29/2024   Lab Results  Component Value Date   NA 138 01/29/2024   K 4.6 01/29/2024   CL 104 01/29/2024   CO2 30 01/29/2024   Lab Results  Component Value Date   ALT 17 01/29/2024   AST 24 01/29/2024   ALKPHOS 61 01/29/2024   BILITOT 0.8 01/29/2024     RADIOGRAPHY: NM PET (PSMA) SKULL TO MID THIGH Result Date: 03/03/2024 CLINICAL DATA:  Prostate carcinoma.  High risk staging. EXAM: NUCLEAR MEDICINE PET SKULL BASE TO THIGH TECHNIQUE: 7.8 mCi Flotufolastat (Posluma) was injected intravenously. Full-ring PET imaging was performed from the skull base to thigh after the radiotracer. CT data was obtained and used for attenuation correction and anatomic localization. COMPARISON:  MRI 01/03/2024 FINDINGS: NECK No radiotracer activity in neck lymph nodes. Incidental CT finding: None. CHEST No radiotracer accumulation within mediastinal or hilar lymph nodes. No suspicious pulmonary nodules on the CT scan. Incidental CT finding: None. ABDOMEN/PELVIS Prostate: Focal activity in the inferior posterior RIGHT lobe of the prostate gland with SUV max equal 11.2 on image 189 corresponds to lesion on comparison MRI. Is Lymph nodes: No abnormal radiotracer accumulation within pelvic or abdominal nodes. Liver: No evidence of liver metastasis. Incidental CT finding: None. SKELETON No focal activity to suggest skeletal metastasis. IMPRESSION: 1. Focal activity in the inferior posterior RIGHT lobe of the prostate gland consistent with primary prostate adenocarcinoma. 2. No evidence of metastatic adenopathy in the pelvis or periaortic retroperitoneum. 3. No evidence of visceral metastasis or skeletal metastasis. Electronically Signed   By: Genevive Bi M.D.   On: 03/03/2024 17:08      IMPRESSION/PLAN: 1. 73 y.o. gentleman with Stage T1c adenocarcinoma  of the prostate with Gleason Score of 4+4, and PSA of 7.32. We discussed the patient's workup and outlined the nature of prostate cancer in this setting. The patient's T stage, Gleason's score, and PSA put him into the high risk group. Accordingly, he is eligible for  a variety of potential treatment options including prostatectomy or LT-ADT concurrent with either 8 weeks of external radiation or 5 weeks of external radiation with an upfront brachytherapy boost. We discussed the available radiation techniques, and focused on the details and logistics of delivery. We discussed and outlined the risks, benefits, short and long-term effects associated with radiotherapy and compared and contrasted these with prostatectomy. We discussed the role of SpaceOAR gel in reducing the rectal toxicity associated with radiotherapy. We also detailed the role of ADT in the treatment of high risk prostate cancer and outlined the associated side effects that could be expected with this therapy.  He has already started taking Orgovyx ADT last week and is tolerating this well so far.  He was encouraged to ask questions that were answered to his stated satisfaction.  At the conclusion of our conversation, the patient is interested in moving forward with an 8-week course of daily external beam radiation concurrent with ADT.  He has already started Orgovyx ADT so we will share our discussion with Dr. Marlou Porch and make arrangements for fiducial markers and SpaceOAR gel placement in late May 2025, prior to simulation, to reduce rectal toxicity from radiotherapy. The patient appears to have a good understanding of his disease and our treatment recommendations which are of curative intent and is in agreement with the stated plan.  Therefore, we will move forward with treatment planning accordingly, in anticipation of beginning IMRT approximately 2 months after starting ADT.  We enjoyed meeting him today and look forward to continuing to  participate in his care.  We personally spent 70 minutes in this encounter including chart review, reviewing radiological studies, meeting face-to-face with the patient, entering orders and completing documentation.    Marguarite Arbour, PA-C    Margaretmary Dys, MD  [facility]  Radiation Oncology Direct Dial: [phone]  Fax: [phone] [email or web address]  Skype  LinkedIn   This document serves as a record of services personally performed by Margaretmary Dys, MD and Marcello Fennel, PA-C. It was created on their behalf by Mickie Bail, a trained medical scribe. The creation of this record is based on the scribe's personal observations and the provider's statements to them. This document has been checked and approved by the attending provider.

## 2024-03-04 ENCOUNTER — Encounter: Payer: Self-pay | Admitting: Radiation Oncology

## 2024-03-04 ENCOUNTER — Ambulatory Visit
Admission: RE | Admit: 2024-03-04 | Discharge: 2024-03-04 | Disposition: A | Discharge status: Home / Self Care | Source: Ambulatory Visit | Attending: Radiation Oncology | Admitting: Radiation Oncology

## 2024-03-04 VITALS — BP 166/90 | HR 97 | Temp 97.0°F | Resp 18 | Ht 74.0 in | Wt 231.0 lb

## 2024-03-04 DIAGNOSIS — Z7901 Long term (current) use of anticoagulants: Secondary | ICD-10-CM | POA: Insufficient documentation

## 2024-03-04 DIAGNOSIS — I10 Essential (primary) hypertension: Secondary | ICD-10-CM | POA: Insufficient documentation

## 2024-03-04 DIAGNOSIS — C61 Malignant neoplasm of prostate: Secondary | ICD-10-CM | POA: Insufficient documentation

## 2024-03-04 DIAGNOSIS — Z79899 Other long term (current) drug therapy: Secondary | ICD-10-CM | POA: Diagnosis not present

## 2024-03-04 DIAGNOSIS — Z191 Hormone sensitive malignancy status: Secondary | ICD-10-CM | POA: Diagnosis not present

## 2024-03-04 DIAGNOSIS — M1990 Unspecified osteoarthritis, unspecified site: Secondary | ICD-10-CM | POA: Insufficient documentation

## 2024-03-04 HISTORY — DX: Elevated prostate specific antigen (PSA): R97.20

## 2024-03-06 ENCOUNTER — Other Ambulatory Visit: Payer: Self-pay | Admitting: Urology

## 2024-03-06 NOTE — Progress Notes (Signed)
 Introduced myself to the patient as the prostate nurse navigator.  No barriers to care identified at this time.  He is here to discuss his radiation treatment options and will proceed with ADT and daily radiation.  I gave him my business card and asked him to call me with questions or concerns.  Verbalized understanding.

## 2024-03-10 ENCOUNTER — Encounter: Admitting: Podiatry

## 2024-03-26 ENCOUNTER — Other Ambulatory Visit: Payer: Self-pay | Admitting: Urology

## 2024-03-26 DIAGNOSIS — C61 Malignant neoplasm of prostate: Secondary | ICD-10-CM

## 2024-04-09 ENCOUNTER — Other Ambulatory Visit: Payer: Self-pay | Admitting: Hematology and Oncology

## 2024-04-30 DIAGNOSIS — M25561 Pain in right knee: Secondary | ICD-10-CM | POA: Diagnosis not present

## 2024-05-10 ENCOUNTER — Other Ambulatory Visit: Payer: Self-pay | Admitting: Internal Medicine

## 2024-06-04 DIAGNOSIS — H5203 Hypermetropia, bilateral: Secondary | ICD-10-CM | POA: Diagnosis not present

## 2024-06-04 DIAGNOSIS — H40021 Open angle with borderline findings, high risk, right eye: Secondary | ICD-10-CM | POA: Diagnosis not present

## 2024-06-05 DIAGNOSIS — M25561 Pain in right knee: Secondary | ICD-10-CM | POA: Diagnosis not present

## 2024-06-10 NOTE — Progress Notes (Signed)
 RN left message with patient for call back to assess any needs or questions prior to upcoming start of radiation.

## 2024-06-17 ENCOUNTER — Encounter (HOSPITAL_COMMUNITY): Payer: Self-pay | Admitting: Urology

## 2024-06-17 NOTE — Progress Notes (Signed)
 Spoke w/ via phone for pre-op interview--- [facility] needs dos---- BMP per anesthesia.        Lab results------Current EKG in Epic dated 07/02/23 COVID test -----patient states asymptomatic no test needed Arrive at -------0530 NPO after MN NO Solid Food.  Pre-Surgery Ensure or G2:  Med rec completed Medications to take morning of surgery -----NONE Diabetic medication -----  GLP1 agonist last dose: GLP1 instructions:  Xarelto  instructions given by Dr Federico states hold 3 days prior to procedure. Patient verbalized understanding. Copy of instructions under Media tab.  Patient instructed no nail polish to be worn day of surgery Patient instructed to bring photo id and insurance card day of surgery Patient aware to have Driver (ride ) / caregiver    for 24 hours after surgery - Friend Wadie Bray Patient Special Instructions ----- Shower with antibacterial soap. Fleet enema per surgeons instructions. Pre-Op special Instructions -----  Patient verbalized understanding of instructions that were given at this phone interview. Patient denies chest pain, sob, fever, cough at the interview.

## 2024-06-19 ENCOUNTER — Telehealth: Payer: Self-pay | Admitting: *Deleted

## 2024-06-19 NOTE — Telephone Encounter (Signed)
 Called patient to remind of MRI for 06/23/24- arrival time- 12 pm @ [facility] and his sim on 06-26-24- arrival time- 10:45 am @ [facility], spoke with patient and he is aware of these appts. and the instructions

## 2024-06-20 ENCOUNTER — Encounter (HOSPITAL_COMMUNITY)
Admission: RE | Disposition: A | Discharge status: Home / Self Care | Payer: Self-pay | Source: Home / Self Care | Attending: Urology

## 2024-06-20 ENCOUNTER — Other Ambulatory Visit: Payer: Self-pay

## 2024-06-20 ENCOUNTER — Ambulatory Visit (HOSPITAL_COMMUNITY)

## 2024-06-20 ENCOUNTER — Ambulatory Visit (HOSPITAL_COMMUNITY)
Admission: RE | Admit: 2024-06-20 | Discharge: 2024-06-20 | Disposition: A | Discharge status: Home / Self Care | Attending: Urology | Admitting: Urology

## 2024-06-20 ENCOUNTER — Encounter (HOSPITAL_COMMUNITY): Payer: Self-pay | Admitting: Urology

## 2024-06-20 DIAGNOSIS — I2510 Atherosclerotic heart disease of native coronary artery without angina pectoris: Secondary | ICD-10-CM | POA: Diagnosis not present

## 2024-06-20 DIAGNOSIS — I10 Essential (primary) hypertension: Secondary | ICD-10-CM | POA: Diagnosis not present

## 2024-06-20 DIAGNOSIS — N400 Enlarged prostate without lower urinary tract symptoms: Secondary | ICD-10-CM | POA: Insufficient documentation

## 2024-06-20 DIAGNOSIS — Z7901 Long term (current) use of anticoagulants: Secondary | ICD-10-CM | POA: Diagnosis not present

## 2024-06-20 DIAGNOSIS — C61 Malignant neoplasm of prostate: Secondary | ICD-10-CM | POA: Diagnosis not present

## 2024-06-20 HISTORY — PX: GOLD SEED IMPLANT: SHX6343

## 2024-06-20 HISTORY — PX: SPACE OAR INSTILLATION: SHX6769

## 2024-06-20 LAB — BASIC METABOLIC PANEL WITH GFR
Anion gap: 10 (ref 5–15)
BUN: 14 mg/dL (ref 8–23)
CO2: 23 mmol/L (ref 22–32)
Calcium: 9.5 mg/dL (ref 8.9–10.3)
Chloride: 106 mmol/L (ref 98–111)
Creatinine, Ser: 0.9 mg/dL (ref 0.61–1.24)
GFR, Estimated: >60 mL/min (ref >60)
Glucose, Bld: 85 mg/dL (ref 70–99)
Potassium: 4.2 mmol/L (ref 3.5–5.1)
Sodium: 139 mmol/L (ref 135–145)

## 2024-06-20 SURGERY — INSERTION, GOLD SEEDS
Anesthesia: Monitor Anesthesia Care | Site: Prostate

## 2024-06-20 MED ORDER — MIDAZOLAM HCL 2 MG/2ML IJ SOLN
INTRAMUSCULAR | Status: AC
Start: 2024-06-20 — End: 2024-06-20
  Filled 2024-06-20: qty 2

## 2024-06-20 MED ORDER — ACETAMINOPHEN 10 MG/ML IV SOLN: 1000.0000 mg | Freq: Once | INTRAVENOUS | Status: DC | PRN

## 2024-06-20 MED ORDER — PHENYLEPHRINE 80 MCG/ML (10ML) SYRINGE FOR IV PUSH (FOR BLOOD PRESSURE SUPPORT)
PREFILLED_SYRINGE | INTRAVENOUS | Status: AC
  Filled 2024-06-20: qty 10

## 2024-06-20 MED ORDER — RIVAROXABAN 10 MG PO TABS: 10.0000 mg | ORAL_TABLET | Freq: Every day | ORAL | Status: DC

## 2024-06-20 MED ORDER — ORAL CARE MOUTH RINSE: 15.0000 mL | Freq: Once | OROMUCOSAL | Status: AC

## 2024-06-20 MED ORDER — CEFAZOLIN SODIUM-DEXTROSE 2-4 GM/100ML-% IV SOLN
INTRAVENOUS | Status: AC
  Filled 2024-06-20: qty 100

## 2024-06-20 MED ORDER — DEXAMETHASONE SODIUM PHOSPHATE 10 MG/ML IJ SOLN
INTRAMUSCULAR | Status: AC
  Filled 2024-06-20: qty 1

## 2024-06-20 MED ORDER — DROPERIDOL 2.5 MG/ML IJ SOLN: 0.6250 mg | Freq: Once | INTRAMUSCULAR | Status: DC | PRN

## 2024-06-20 MED ORDER — LIDOCAINE 2% (20 MG/ML) 5 ML SYRINGE
INTRAMUSCULAR | Status: AC
  Filled 2024-06-20: qty 5

## 2024-06-20 MED ORDER — FENTANYL CITRATE (PF) 250 MCG/5ML IJ SOLN
INTRAMUSCULAR | Status: AC
  Filled 2024-06-20: qty 5

## 2024-06-20 MED ORDER — CEFAZOLIN SODIUM-DEXTROSE 2-4 GM/100ML-% IV SOLN
2.0000 g | INTRAVENOUS | Status: AC
  Administered 2024-06-20: 2 g via INTRAVENOUS

## 2024-06-20 MED ORDER — ONDANSETRON HCL 4 MG/2ML IJ SOLN
INTRAMUSCULAR | Status: DC | PRN
  Administered 2024-06-20: 4 mg via INTRAVENOUS

## 2024-06-20 MED ORDER — MIDAZOLAM HCL 2 MG/2ML IJ SOLN
INTRAMUSCULAR | Status: DC | PRN
  Administered 2024-06-20: 2 mg via INTRAVENOUS

## 2024-06-20 MED ORDER — SODIUM CHLORIDE (PF) 0.9 % IJ SOLN
INTRAMUSCULAR | Status: DC | PRN
  Administered 2024-06-20: 10 mL

## 2024-06-20 MED ORDER — PROPOFOL 500 MG/50ML IV EMUL
INTRAVENOUS | Status: DC | PRN
  Administered 2024-06-20: 150 ug/kg/min via INTRAVENOUS

## 2024-06-20 MED ORDER — CHLORHEXIDINE GLUCONATE 0.12 % MT SOLN
15.0000 mL | Freq: Once | OROMUCOSAL | Status: AC
  Administered 2024-06-20: 15 mL via OROMUCOSAL

## 2024-06-20 MED ORDER — LIDOCAINE HCL (PF) 1 % IJ SOLN
INTRAMUSCULAR | Status: DC | PRN
  Administered 2024-06-20: 10 mL

## 2024-06-20 MED ORDER — FENTANYL CITRATE (PF) 250 MCG/5ML IJ SOLN
INTRAMUSCULAR | Status: DC | PRN
  Administered 2024-06-20 (×2): 50 ug via INTRAVENOUS

## 2024-06-20 MED ORDER — ONDANSETRON HCL 4 MG/2ML IJ SOLN
INTRAMUSCULAR | Status: AC
  Filled 2024-06-20: qty 2

## 2024-06-20 MED ORDER — EPHEDRINE 5 MG/ML INJ
INTRAVENOUS | Status: AC
  Filled 2024-06-20: qty 5

## 2024-06-20 MED ORDER — LACTATED RINGERS IV SOLN: INTRAVENOUS | Status: DC

## 2024-06-20 MED ORDER — DEXAMETHASONE SODIUM PHOSPHATE 10 MG/ML IJ SOLN
INTRAMUSCULAR | Status: DC | PRN
  Administered 2024-06-20: 10 mg via INTRAVENOUS

## 2024-06-20 MED ORDER — GLYCOPYRROLATE 0.2 MG/ML IJ SOLN
INTRAMUSCULAR | Status: DC | PRN
  Administered 2024-06-20: .2 mg via INTRAVENOUS

## 2024-06-20 MED ORDER — CHLORHEXIDINE GLUCONATE 0.12 % MT SOLN
OROMUCOSAL | Status: AC
  Filled 2024-06-20: qty 15

## 2024-06-20 MED ORDER — FENTANYL CITRATE (PF) 100 MCG/2ML IJ SOLN: 25.0000 ug | INTRAMUSCULAR | Status: DC | PRN

## 2024-06-20 MED ORDER — LACTATED RINGERS IV SOLN: INTRAVENOUS | Status: DC | PRN

## 2024-06-20 MED ORDER — PROPOFOL 10 MG/ML IV BOLUS
INTRAVENOUS | Status: AC
  Filled 2024-06-20: qty 20

## 2024-06-20 SURGICAL SUPPLY — 23 items
BLADE CLIPPER SENSICLIP SURGIC (BLADE) ×1 IMPLANT
CNTNR URN SCR LID CUP LEK RST (MISCELLANEOUS) ×1 IMPLANT
COVER BACK TABLE 60X90IN (DRAPES) ×1 IMPLANT
DRSG TEGADERM 4X4.75 (GAUZE/BANDAGES/DRESSINGS) ×1 IMPLANT
DRSG TEGADERM 8X12 (GAUZE/BANDAGES/DRESSINGS) ×1 IMPLANT
GAUZE SPONGE 4X4 12PLY STRL (GAUZE/BANDAGES/DRESSINGS) ×1 IMPLANT
GLOVE BIO SURGEON STRL SZ7.5 (GLOVE) ×1 IMPLANT
GLOVE BIO SURGEON STRL SZ8 (GLOVE) IMPLANT
GLOVE SURG ORTHO 8.5 STRL (GLOVE) ×1 IMPLANT
IMPL SPACEOAR SYSTEM 10ML (Spacer) ×1 IMPLANT
MARKER GOLD PRELOAD 1.2X3 (Urological Implant) ×1 IMPLANT
MARKER SKIN DUAL TIP RULER LAB (MISCELLANEOUS) ×1 IMPLANT
NDL SPNL 22GX3.5 QUINCKE BK (NEEDLE) ×1 IMPLANT
NEEDLE SPNL 22GX3.5 QUINCKE BK (NEEDLE) ×1 IMPLANT
SHEATH ULTRASOUND LF (SHEATH) IMPLANT
SHEATH ULTRASOUND LTX NONSTRL (SHEATH) IMPLANT
SLEEVE SCD COMPRESS KNEE MED (STOCKING) ×1 IMPLANT
SURGILUBE 2OZ TUBE FLIPTOP (MISCELLANEOUS) ×1 IMPLANT
SYR 10ML LL (SYRINGE) IMPLANT
SYR CONTROL 10ML LL (SYRINGE) ×1 IMPLANT
TOWEL GREEN STERILE (TOWEL DISPOSABLE) IMPLANT
TOWEL OR 17X24 6PK STRL BLUE (TOWEL DISPOSABLE) ×1 IMPLANT
UNDERPAD 30X36 HEAVY ABSORB (UNDERPADS AND DIAPERS) ×1 IMPLANT

## 2024-06-20 NOTE — H&P (Signed)
 H&P  Chief Complaint: prostate cancer  History of Present Illness: Marc [name] is a 73 y.o. male with a diagnosis of prostate cancer. He has has a longstanding history of BPH, followed by Dr. Cam since approximately 2019 for mild LUTS, ED, and an elevated PSA since 2021. His PSA has fluctuated over the years, between 3.5-7.2.  His PSA was 5.29 in October 2024.  He underwent a prostate MRI on 01/03/24 showing a PI-RADS 4 lesion in the right posteromedial peripheral zone. The patient proceeded to MRI fusion biopsy of the prostate on 02/05/24 and a repeat PSA obtained that day remained elevated at 7.32. The prostate volume measured 49 cc.  Out of 15 core biopsies, 12 were positive.  The maximum Gleason score was 4+4, and this was seen in all three samples from the MRI ROI. Additionally, Gleason 4+3 was seen in the right mid lateral, Gleason 3+4 in the right base lateral (with perineural invasion), right base (with PNI and cribriform glands), left apex, and left mid, and Gleason 3+3 in the left apex lateral (two foci), left base, right mid, and right mid lateral (small focus).   He underwent staging PSMA PET scan on 02/28/24 showing no evidence of disease outside of the prostate.   The patient reviewed the biopsy results with his urologist and was started on Orgovyx ADT. Planning 8-week course of daily external beam radiation concurrent with ADT. He's been well - no cough, congestion, dysuria or gross hematuria.   Past Medical History:  Diagnosis Date   Elevated PSA    History of cocaine abuse (HCC)    Hypertension    Osteoarthritis    Past Surgical History:  Procedure Laterality Date   JOINT REPLACEMENT  06/22/11   KNEE ARTHROPLASTY Left 06/21/2011   Dempsey Moan, MD   PROSTATE BIOPSY      Home Medications:  Medications Prior to Admission  Medication Sig Dispense Refill Last Dose/Taking   atorvastatin  (LIPITOR) 40 MG tablet TAKE 1 TABLET DAILY MONDAY - SATURDAY. SKIP SUNDAYS 90 tablet 2  06/19/2024   gabapentin  (NEURONTIN ) 300 MG capsule TAKE 1 CAPSULE BY MOUTH EVERYDAY AT BEDTIME 90 capsule 1 06/19/2024   magnesium gluconate (MAGONATE) 500 MG tablet Take 500 mg by mouth daily.   Past Month   ORGOVYX 120 MG tablet Take 120 mg by mouth daily.   06/19/2024   telmisartan  (MICARDIS ) 20 MG tablet TAKE 1 TABLET BY MOUTH EVERY DAY 90 tablet 1 06/19/2024   VITAMIN D , CHOLECALCIFEROL, PO Take by mouth daily.   06/19/2024   XARELTO  10 MG TABS tablet TAKE 1 TABLET BY MOUTH EVERY DAY 30 tablet 5 06/16/2024   Allergies: No Known Allergies  Family History  Problem Relation Age of Onset   Stroke Mother 28   Hypertension Mother    Healthy Father    Social History:  reports that he has never smoked. He has never used smokeless tobacco. He reports that he does not currently use alcohol. He reports that he does not currently use drugs.  ROS: A complete review of systems was performed.  All systems are negative except for pertinent findings as noted. Review of Systems  All other systems reviewed and are negative.    Physical Exam:  Vital signs in last 24 hours: Temp:  [97.9 F (36.6 C)] 97.9 F (36.6 C) (07/25 0600) Pulse Rate:  [78] 78 (07/25 0600) Resp:  [18] 18 (07/25 0600) BP: (137)/(97) 137/97 (07/25 0600) SpO2:  [100 %] 100 % (07/25 0600) Weight:  [  102.1 kg] 102.1 kg (07/25 0600) General:  Alert and oriented, No acute distress HEENT: Normocephalic, atraumatic Cardiovascular: Regular rate and rhythm Lungs: Regular rate and effort Abdomen: Soft, nontender, nondistended, no abdominal masses Back: No CVA tenderness Extremities: No edema Neurologic: Grossly intact  Laboratory Data:  Results for orders placed or performed during the hospital encounter of 06/20/24 (from the past 24 hours)  Basic metabolic panel per protocol     Status: None   Collection Time: 06/20/24  6:30 AM  Result Value Ref Range   Sodium 139 135 - 145 mmol/L   Potassium 4.2 3.5 - 5.1 mmol/L   Chloride  106 98 - 111 mmol/L   CO2 23 22 - 32 mmol/L   Glucose, Bld 85 70 - 99 mg/dL   BUN 14 8 - 23 mg/dL   Creatinine, Ser 9.09 0.61 - 1.24 mg/dL   Calcium  9.5 8.9 - 10.3 mg/dL   GFR, Estimated >39 >39 mL/min   Anion gap 10 5 - 15   No results found for this or any previous visit (from the past 240 hours). Creatinine: Recent Labs    06/20/24 0630  CREATININE 0.90    Impression/Assessment/plan:  Prostate cancer - I discussed with the patient the nature, potential benefits, risks and alternatives to TRUS, gold seed prostate markers and SpaceOar gel, including side effects of the proposed treatment, the likelihood of the patient achieving the goals of the procedure, and any potential problems that might occur during the procedure or recuperation. Discussed risk of urethral, bladder, rectal injury as well as bleeding and infection (a friend had spaceoar gel infection) among others. All questions answered. Patient elects to proceed.    Donnice Brooks 06/20/2024, 7:32 AM

## 2024-06-20 NOTE — Discharge Instructions (Addendum)
 Prostate gold seed markers and biodegradable SpaceOAR gel insertion, Care After The following information offers guidance on how to care for yourself after your procedure. Your health care provider may also give you more specific instructions. If you have problems or questions, contact your health care provider. What can I expect after the procedure? After the procedure, it is common to have: Urinary symptoms. These may include: Trouble urinating. Blood in the urine or semen. Frequent feeling of an urgent need to urinate. Constipation, nausea, or bloating and gas. Bruising, swelling, and tenderness of the area beneath the scrotum (perineum). Tiredness (fatigue). Burning or pain in the rectum  Follow these instructions at home: Eating and drinking  Follow instructions from your health care provider about eating or drinking restrictions. Drink enough fluid to keep your urine pale yellow. Treatment area care Check your treatment area every day for signs of infection. Watch for: Redness, swelling, or pain. New drainage or blood. Some dried fluid or blood may be present. Warmth. Pus or a bad smell. Managing pain and swelling If directed, put ice on the affected area. To do this: Put ice in a plastic bag. Place a towel between your skin and the bag. Leave the ice on for 20 minutes at a time, 2-3 times a day. Be sure to remove the ice if your skin turns bright red. If you cannot feel pain, heat, or cold, you have a greater risk of damage to the area. Try not to sit directly on the perineum. A soft cushion can help with discomfort. Activity  Do not lift anything that is heavier than 10 lb (4.5 kg), or the limit that you are told, until your health care provider says that it is safe. Rest as told by your health care provider. Return to your normal activities as told by your health care provider. Ask your health care provider what activities are safe for you. Most people can return to normal  activities within a few days. If you were given a sedative during the procedure, it can affect you for several hours. Do not drive or operate machinery until your health care provider says that it is safe. Follow instructions from your health care provider about when it is safe for you to engage in sexual activity. Managing constipation Your procedure may cause constipation. To prevent or treat constipation, you may need to: Take over-the-counter or prescription medicines. Eat foods that are high in fiber, such as beans, whole grains, and fresh fruits and vegetables. Limit foods that are high in fat and processed sugars, such as fried or sweet foods. General instructions Take over-the-counter and prescription medicines only as told by your health care provider. Do not take baths, swim, or use a hot tub until your health care provider approves. You may take a shower and wash the perineum gently. Do not strain to have a bowel movement. Do not use any products that contain nicotine or tobacco. These products include cigarettes, chewing tobacco, and vaping devices, such as e-cigarettes. If you need help quitting, ask your health care provider. Keep all follow-up visits because you may need more treatment. Contact a health care provider if you: Have a fever or chills. Have any of these signs of infection in the treatment area: Redness, swelling, or pain that is new or gets worse. Fluid or blood. Warmth. Pus or a bad smell. Have no bowel movements for 3-4 days after the procedure. Have diarrhea for 3-4 days after the procedure. Develop any new symptoms, such  as problems passing urine or erectile dysfunction. Have pain in your abdomen. Have more blood in your urine, have urine that is cloudy or smells bad, or have pain or burning when you urinate. Have swelling or pain in your legs. Get help right away if: You cannot urinate. You have a lot of bleeding from your rectum. You have unusual  drainage coming from your rectum. You have pain in the treated area that does not go away with pain medicine or gets worse. You have severe nausea or vomiting. You have trouble breathing. Summary Talk with your health care provider about your risk of brachytherapy side effects, such as erectile dysfunction or urinary problems. If you have permanent, low-dose brachytherapy implants, limit close contact with children and pregnant women for 2 months or as told by your health care provider. You may be told to use a condom during sex for the first 2 months after low-dose brachytherapy. Keep all follow-up visits because you may need more treatment. This information is not intended to replace advice given to you by your health care provider. Make sure you discuss any questions you have with your health care provider. Document Revised: 02/09/2021 Document Reviewed: 02/09/2021 Post Anesthesia Home Care Instructions  Activity: Get plenty of rest for the remainder of the day. A responsible adult should stay with you for 24 hours following the procedure.  For the next 24 hours, DO NOT: -Drive a car -Advertising copywriter -Drink alcoholic beverages -Take any medication unless instructed by your physician -Make any legal decisions or sign important papers.  Meals: Start with liquid foods such as gelatin or soup. Progress to regular foods as tolerated. Avoid greasy, spicy, heavy foods. If nausea and/or vomiting occur, drink only clear liquids until the nausea and/or vomiting subsides. Call your physician if vomiting continues.  Special Instructions/Symptoms: Your throat may feel dry or sore from the anesthesia or the breathing tube placed in your throat during surgery. If this causes discomfort, gargle with warm salt water. The discomfort should disappear within 24 hours.  If you had a scopolamine patch placed behind your ear for the management of post- operative nausea and/or vomiting:  1. The medication  in the patch is effective for 72 hours, after which it should be removed.  Wrap patch in a tissue and discard in the trash. Wash hands thoroughly with soap and water. 2. You may remove the patch earlier than 72 hours if you experience unpleasant side effects which may include dry mouth, dizziness or visual disturbances. 3. Avoid touching the patch. Wash your hands with soap and water after contact with the patch.  Call your surgeon if you experience:   1.  Fever over 101.0. 2.  Inability to urinate. 3.  Nausea and/or vomiting. 4.  Extreme swelling or bruising at the surgical site. 5.  Continued bleeding from the incision. 6.  Increased pain, redness or drainage from the incision. 7.  Problems related to your pain medication. 8. Any change in color, movement and/or sensation 9. Any problems and/or concerns

## 2024-06-20 NOTE — Transfer of Care (Signed)
 Immediate Anesthesia Transfer of Care Note  Patient: Marc [name]  Procedure(s) Performed: INSERTION, GOLD SEEDS (Prostate) INJECTION, HYDROGEL SPACER (Prostate)  Patient Location: PACU  Anesthesia Type:MAC  Level of Consciousness: drowsy  Airway & Oxygen Therapy: Patient Spontanous Breathing and Patient connected to face mask oxygen  Post-op Assessment: Report given to RN and Post -op Vital signs reviewed and stable  Post vital signs: Reviewed and stable  Last Vitals:  Vitals Value Taken Time  BP 95/65 06/20/24 08:23  Temp    Pulse 69 06/20/24 08:27  Resp 11 06/20/24 08:27  SpO2 100 % 06/20/24 08:27  Vitals shown include unfiled device data.  Last Pain:  Vitals:   06/20/24 0600  TempSrc: Oral  PainSc: 0-No pain      Patients Stated Pain Goal: 6 (06/20/24 0600)  Complications: No notable events documented.

## 2024-06-20 NOTE — Op Note (Signed)
 Preoperative diagnosis: Prostate cancer Postoperative diagnosis: Prostate cancer  Procedure: Transrectal ultrasound-guided transperineal placement of gold seed prostate fiducial markers and SpaceOAR biodegradable gel  Surgeon: Nieves  Anesthesia: General  Indication for procedure: 73 yo male who is preparing to start external beam radiation.  He presents today for the above.  Findings: Normal-appearing prostate with intact capsule and no hypoechoic. Seminal vesicles appeared normal. Small prostate.   Description of procedure: After consent was obtained patient brought to the operating room.  After adequate anesthesia he was placed in lithotomy position and the scrotum supported superiorly.  The perineum was prepped.  Ultrasound probe inserted rectally.  Prostate imaged in the axial and sagittal views. 10 cc lidocaine  instilled in the perineum.   The gold seed markers were passed transperineally under ultrasound guidance placing three total with one in the right base, right apex and left mid of the prostate gland.  The 18-gauge needle was then inserted approximately 1 to 2 cm anterior to the anal opening and directed under ultrasonic guidance into the perirectal fat between the anterior rectal wall and the prostate capsule down to the mid-gland. Midline needle position was confirmed in the sagittal and axial views to verify the tip was in the perirectal fat.  Small amounts of saline were injected to hydrodissect the space between the prostate and the anterior rectal wall.  Axial imaging was viewed to confirm the needle was in the correct location in the mid gland and centered.  Aspiration confirmed no intravascular access.  The saline syringe was carefully disconnected maintaining the desired needle position and the hydrogel was attached to the needle.  Under ultrasound guidance in the sagittal view a smooth continuous injection was done over about 12 seconds delivering the hydrogel into the space  between the prostate and rectal wall.  The needle was withdrawn. The TRUS was removed. Post-procedure DRE noted no rectal injury and no blood on gloved finger.   A dressing was placed and the patient was awakened and taken to the cover room in stable condition.  Complications: None  Blood loss: Minimal  Specimens: None  Drains: None  Disposition: Patient stable to PACU

## 2024-06-20 NOTE — Anesthesia Preprocedure Evaluation (Addendum)
 Anesthesia Evaluation  Patient identified by MRN, date of birth, ID band Patient awake    Reviewed: Allergy & Precautions, NPO status , Patient's Chart, lab work & pertinent test results  Airway Mallampati: II  TM Distance: >3 FB Neck ROM: Full    Dental no notable dental hx.    Pulmonary neg pulmonary ROS   Pulmonary exam normal        Cardiovascular hypertension, Pt. on medications + CAD   Rhythm:Regular Rate:Normal     Neuro/Psych negative neurological ROS  negative psych ROS   GI/Hepatic negative GI ROS, Neg liver ROS,,,  Endo/Other  negative endocrine ROS    Renal/GU negative Renal ROS   Prostate Ca    Musculoskeletal  (+) Arthritis , Osteoarthritis,    Abdominal Normal abdominal exam  (+)   Peds  Hematology negative hematology ROS (+)   Anesthesia Other Findings   Reproductive/Obstetrics                              Anesthesia Physical Anesthesia Plan  ASA: 2  Anesthesia Plan: MAC   Post-op Pain Management:    Induction: Intravenous  PONV Risk Score and Plan: 1 and Ondansetron, Dexamethasone and Treatment may vary due to age or medical condition  Airway Management Planned: Simple Face Mask and Nasal Cannula  Additional Equipment: None  Intra-op Plan:   Post-operative Plan:   Informed Consent: I have reviewed the patients History and Physical, chart, labs and discussed the procedure including the risks, benefits and alternatives for the proposed anesthesia with the patient or authorized representative who has indicated his/her understanding and acceptance.     Dental advisory given  Plan Discussed with: CRNA  Anesthesia Plan Comments:         Anesthesia Quick Evaluation

## 2024-06-20 NOTE — Anesthesia Postprocedure Evaluation (Signed)
 Anesthesia Post Note  Patient: Marc [name]  Procedure(s) Performed: INSERTION, GOLD SEEDS (Prostate) INJECTION, HYDROGEL SPACER (Prostate)     Patient location during evaluation: PACU Anesthesia Type: MAC Level of consciousness: awake and alert Pain management: pain level controlled Vital Signs Assessment: post-procedure vital signs reviewed and stable Respiratory status: spontaneous breathing, nonlabored ventilation, respiratory function stable and patient connected to nasal cannula oxygen Cardiovascular status: stable and blood pressure returned to baseline Postop Assessment: no apparent nausea or vomiting Anesthetic complications: no   No notable events documented.  Last Vitals:  Vitals:   06/20/24 0822 06/20/24 0845  BP: 95/65 106/87  Pulse: 73 72  Resp: 16 16  Temp:    SpO2: 98% 97%    Last Pain:  Vitals:   06/20/24 0845  TempSrc:   PainSc: 0-No pain                 [name]

## 2024-06-21 DIAGNOSIS — M25561 Pain in right knee: Secondary | ICD-10-CM | POA: Diagnosis not present

## 2024-06-23 ENCOUNTER — Encounter (HOSPITAL_COMMUNITY): Payer: Self-pay | Admitting: Urology

## 2024-06-23 ENCOUNTER — Ambulatory Visit (HOSPITAL_COMMUNITY)
Admission: RE | Admit: 2024-06-23 | Discharge: 2024-06-23 | Disposition: A | Discharge status: Home / Self Care | Source: Ambulatory Visit | Attending: Urology | Admitting: Urology

## 2024-06-23 ENCOUNTER — Ambulatory Visit: Payer: Self-pay | Admitting: Radiation Oncology

## 2024-06-23 DIAGNOSIS — C61 Malignant neoplasm of prostate: Secondary | ICD-10-CM | POA: Diagnosis present

## 2024-06-25 ENCOUNTER — Telehealth: Payer: Self-pay | Admitting: *Deleted

## 2024-06-25 NOTE — Telephone Encounter (Signed)
 CALLED PATIENT TO REMIND OF SIM APPT. FOR 06-26-24- ARRIVAL TIME- 10:45 AM @ [facility], INFORMED PATIENT TO ARRIVE WITH A FULL BLADDER, LVM FOR A RETURN CALL

## 2024-06-26 ENCOUNTER — Ambulatory Visit
Admission: RE | Admit: 2024-06-26 | Discharge: 2024-06-26 | Disposition: A | Discharge status: Home / Self Care | Source: Ambulatory Visit | Attending: Radiation Oncology | Admitting: Radiation Oncology

## 2024-06-26 DIAGNOSIS — C61 Malignant neoplasm of prostate: Secondary | ICD-10-CM | POA: Insufficient documentation

## 2024-06-26 NOTE — Progress Notes (Signed)
  Radiation Oncology         [phone]) [phone] ________________________________  Name: Marc [name] MRN: [id number]  Date: 06/26/2024  DOB: Jan 14, 1951  SIMULATION AND TREATMENT PLANNING NOTE    ICD-10-CM   1. Malignant neoplasm of prostate (HCC)  C61       DIAGNOSIS:  73 y.o. gentleman with Stage T1c adenocarcinoma of the prostate with Gleason score of 4+4, and PSA of 7.32.  NARRATIVE:  The patient was brought to the CT Simulation planning suite.  Identity was confirmed.  All relevant records and images related to the planned course of therapy were reviewed.  The patient freely provided informed written consent to proceed with treatment after reviewing the details related to the planned course of therapy. The consent form was witnessed and verified by the simulation staff.  Then, the patient was set-up in a stable reproducible supine position for radiation therapy.  A vacuum lock pillow device was custom fabricated to position his legs in a reproducible immobilized position.  Then, I performed a urethrogram under sterile conditions to identify the prostatic bed.  CT images were obtained.  Surface markings were placed.  The CT images were loaded into the planning software.  Then the prostate bed target, pelvic lymph node target and avoidance structures including the rectum, bladder, bowel and hips were contoured.  Treatment planning then occurred.  The radiation prescription was entered and confirmed.  A total of one complex treatment devices were fabricated. I have requested : Intensity Modulated Radiotherapy (IMRT) is medically necessary for this case for the following reason:  Rectal sparing.SABRA  PLAN:  The patient will receive 45 Gy in 25 fractions of 1.8 Gy, followed by a boost to the prostate to a total dose of 75 Gy with 15 additional fractions of 2 Gy.   ________________________________  Donnice FELIX Patrcia, M.D.

## 2024-07-07 ENCOUNTER — Encounter: Payer: PPO | Admitting: Internal Medicine

## 2024-07-07 ENCOUNTER — Ambulatory Visit
Admission: RE | Admit: 2024-07-07 | Discharge: 2024-07-07 | Disposition: A | Discharge status: Home / Self Care | Source: Ambulatory Visit | Attending: Radiation Oncology | Admitting: Radiation Oncology

## 2024-07-07 DIAGNOSIS — C61 Malignant neoplasm of prostate: Secondary | ICD-10-CM | POA: Diagnosis present

## 2024-07-07 NOTE — Progress Notes (Signed)
 Pharmacy Quality Measure Review  This patient is appearing on a report for being at risk of failing the adherence measure for cholesterol (statin) medications this calendar year.   Medication: atorvastatin  40 mg daily Last fill date: 05/12/24 for 90 day supply  Insurance report was not up to date. No action needed at this time.  Confirmed recent fill dates with [facility] records and chart review.   Zeth Buday  [place], [facility]

## 2024-07-08 ENCOUNTER — Encounter: Payer: Self-pay | Admitting: Pharmacist

## 2024-07-08 NOTE — Progress Notes (Signed)
   07/08/2024  Patient ID: Marc [name], male   DOB: 1951/10/19, 73 y.o.   MRN: [id number]  Pharmacy Quality Measure Review  This patient is appearing on a report for being at risk of failing the adherence measure for hypertension (ACEi/ARB) medications this calendar year.   Medication: Telmisartan  20 mg  Last fill date: 05/19/24 for 90 day supply   Plan: Will collaborate with provider to facilitate refill needs. Upcoming appointment 07/22/24 will send a delayed note to the PCP prior to the upcoming appointment to send in refills of Telmisartan  if he will remain on it.  Cassius DOROTHA Brought, PharmD, BCACP Clinical Pharmacist [phone]  .

## 2024-07-10 ENCOUNTER — Telehealth: Payer: Self-pay | Admitting: Internal Medicine

## 2024-07-10 ENCOUNTER — Other Ambulatory Visit: Payer: Self-pay

## 2024-07-10 DIAGNOSIS — C61 Malignant neoplasm of prostate: Secondary | ICD-10-CM | POA: Diagnosis not present

## 2024-07-10 DIAGNOSIS — M23241 Derangement of anterior horn of lateral meniscus due to old tear or injury, right knee: Secondary | ICD-10-CM | POA: Diagnosis not present

## 2024-07-10 DIAGNOSIS — M25562 Pain in left knee: Secondary | ICD-10-CM | POA: Diagnosis not present

## 2024-07-10 LAB — RAD ONC ARIA SESSION SUMMARY
Course Elapsed Days: 0
Plan Fractions Treated to Date: 1
Plan Prescribed Dose Per Fraction: 1.8 Gy
Plan Total Fractions Prescribed: 25
Plan Total Prescribed Dose: 45 Gy
Reference Point Dosage Given to Date: 1.8 Gy
Reference Point Session Dosage Given: 1.8 Gy
Session Number: 1

## 2024-07-10 NOTE — Telephone Encounter (Unsigned)
 Copied from CRM #[id number]. Topic: Appointments - Scheduling Inquiry for Clinic >> Jul 10, 2024  5:04 PM Donee H wrote: Reason for CRM: Patient called wanting to reschedule Physical appointment with Dr. Jarold due to having radiation that day at 10:00 am. There is no availably until Dec 24, 2024. Patient does not want to be seen by anyone else besides Dr. Jarold. He would like to see if it's anyway possible that he can be seen in Aug or Sept. Please follow up with patient at [phone]

## 2024-07-11 ENCOUNTER — Ambulatory Visit
Admission: RE | Admit: 2024-07-11 | Discharge: 2024-07-11 | Disposition: A | Discharge status: Home / Self Care | Source: Ambulatory Visit | Attending: Radiation Oncology | Admitting: Radiation Oncology

## 2024-07-11 ENCOUNTER — Other Ambulatory Visit: Payer: Self-pay

## 2024-07-11 DIAGNOSIS — C61 Malignant neoplasm of prostate: Secondary | ICD-10-CM | POA: Diagnosis not present

## 2024-07-11 LAB — RAD ONC ARIA SESSION SUMMARY
Course Elapsed Days: 1
Plan Fractions Treated to Date: 2
Plan Prescribed Dose Per Fraction: 1.8 Gy
Plan Total Fractions Prescribed: 25
Plan Total Prescribed Dose: 45 Gy
Reference Point Dosage Given to Date: 3.6 Gy
Reference Point Session Dosage Given: 1.8 Gy
Session Number: 2

## 2024-07-14 ENCOUNTER — Ambulatory Visit
Admission: RE | Admit: 2024-07-14 | Discharge: 2024-07-14 | Disposition: A | Discharge status: Home / Self Care | Source: Ambulatory Visit | Attending: Radiation Oncology | Admitting: Radiation Oncology

## 2024-07-14 ENCOUNTER — Other Ambulatory Visit: Payer: Self-pay

## 2024-07-14 DIAGNOSIS — C61 Malignant neoplasm of prostate: Secondary | ICD-10-CM | POA: Diagnosis not present

## 2024-07-14 LAB — RAD ONC ARIA SESSION SUMMARY
Course Elapsed Days: 4
Plan Fractions Treated to Date: 3
Plan Prescribed Dose Per Fraction: 1.8 Gy
Plan Total Fractions Prescribed: 25
Plan Total Prescribed Dose: 45 Gy
Reference Point Dosage Given to Date: 5.4 Gy
Reference Point Session Dosage Given: 1.8 Gy
Session Number: 3

## 2024-07-15 ENCOUNTER — Other Ambulatory Visit: Payer: Self-pay

## 2024-07-15 ENCOUNTER — Ambulatory Visit
Admission: RE | Admit: 2024-07-15 | Discharge: 2024-07-15 | Disposition: A | Discharge status: Home / Self Care | Source: Ambulatory Visit | Attending: Radiation Oncology | Admitting: Radiation Oncology

## 2024-07-15 DIAGNOSIS — C61 Malignant neoplasm of prostate: Secondary | ICD-10-CM | POA: Diagnosis not present

## 2024-07-15 LAB — RAD ONC ARIA SESSION SUMMARY
Course Elapsed Days: 5
Plan Fractions Treated to Date: 4
Plan Prescribed Dose Per Fraction: 1.8 Gy
Plan Total Fractions Prescribed: 25
Plan Total Prescribed Dose: 45 Gy
Reference Point Dosage Given to Date: 7.2 Gy
Reference Point Session Dosage Given: 1.8 Gy
Session Number: 4

## 2024-07-16 ENCOUNTER — Other Ambulatory Visit: Payer: Self-pay

## 2024-07-16 ENCOUNTER — Ambulatory Visit
Admission: RE | Admit: 2024-07-16 | Discharge: 2024-07-16 | Disposition: A | Discharge status: Home / Self Care | Source: Ambulatory Visit | Attending: Radiation Oncology

## 2024-07-16 DIAGNOSIS — C61 Malignant neoplasm of prostate: Secondary | ICD-10-CM | POA: Diagnosis not present

## 2024-07-16 LAB — RAD ONC ARIA SESSION SUMMARY
Course Elapsed Days: 6
Plan Fractions Treated to Date: 5
Plan Prescribed Dose Per Fraction: 1.8 Gy
Plan Total Fractions Prescribed: 25
Plan Total Prescribed Dose: 45 Gy
Reference Point Dosage Given to Date: 9 Gy
Reference Point Session Dosage Given: 1.8 Gy
Session Number: 5

## 2024-07-17 ENCOUNTER — Ambulatory Visit
Admission: RE | Admit: 2024-07-17 | Discharge: 2024-07-17 | Disposition: A | Discharge status: Home / Self Care | Source: Ambulatory Visit | Attending: Radiation Oncology | Admitting: Radiation Oncology

## 2024-07-17 ENCOUNTER — Other Ambulatory Visit: Payer: Self-pay

## 2024-07-17 DIAGNOSIS — C61 Malignant neoplasm of prostate: Secondary | ICD-10-CM | POA: Diagnosis not present

## 2024-07-17 LAB — RAD ONC ARIA SESSION SUMMARY
Course Elapsed Days: 7
Plan Fractions Treated to Date: 6
Plan Prescribed Dose Per Fraction: 1.8 Gy
Plan Total Fractions Prescribed: 25
Plan Total Prescribed Dose: 45 Gy
Reference Point Dosage Given to Date: 10.8 Gy
Reference Point Session Dosage Given: 1.8 Gy
Session Number: 6

## 2024-07-18 ENCOUNTER — Ambulatory Visit
Admission: RE | Admit: 2024-07-18 | Discharge: 2024-07-18 | Disposition: A | Discharge status: Home / Self Care | Source: Ambulatory Visit | Attending: Radiation Oncology | Admitting: Radiation Oncology

## 2024-07-18 ENCOUNTER — Other Ambulatory Visit: Payer: Self-pay

## 2024-07-18 DIAGNOSIS — C61 Malignant neoplasm of prostate: Secondary | ICD-10-CM | POA: Diagnosis not present

## 2024-07-18 LAB — RAD ONC ARIA SESSION SUMMARY
Course Elapsed Days: 8
Plan Fractions Treated to Date: 7
Plan Prescribed Dose Per Fraction: 1.8 Gy
Plan Total Fractions Prescribed: 25
Plan Total Prescribed Dose: 45 Gy
Reference Point Dosage Given to Date: 12.6 Gy
Reference Point Session Dosage Given: 1.8 Gy
Session Number: 7

## 2024-07-21 ENCOUNTER — Ambulatory Visit
Admission: RE | Admit: 2024-07-21 | Discharge: 2024-07-21 | Disposition: A | Discharge status: Home / Self Care | Source: Ambulatory Visit | Attending: Radiation Oncology | Admitting: Radiation Oncology

## 2024-07-21 ENCOUNTER — Other Ambulatory Visit: Payer: Self-pay

## 2024-07-21 DIAGNOSIS — C61 Malignant neoplasm of prostate: Secondary | ICD-10-CM | POA: Diagnosis not present

## 2024-07-21 LAB — RAD ONC ARIA SESSION SUMMARY
Course Elapsed Days: 11
Plan Fractions Treated to Date: 8
Plan Prescribed Dose Per Fraction: 1.8 Gy
Plan Total Fractions Prescribed: 25
Plan Total Prescribed Dose: 45 Gy
Reference Point Dosage Given to Date: 14.4 Gy
Reference Point Session Dosage Given: 1.8 Gy
Session Number: 8

## 2024-07-22 ENCOUNTER — Ambulatory Visit
Admission: RE | Admit: 2024-07-22 | Discharge: 2024-07-22 | Disposition: A | Discharge status: Home / Self Care | Source: Ambulatory Visit | Attending: Radiation Oncology | Admitting: Radiation Oncology

## 2024-07-22 ENCOUNTER — Encounter: Payer: Self-pay | Admitting: Internal Medicine

## 2024-07-22 ENCOUNTER — Other Ambulatory Visit: Payer: Self-pay

## 2024-07-22 ENCOUNTER — Ambulatory Visit (INDEPENDENT_AMBULATORY_CARE_PROVIDER_SITE_OTHER): Admitting: Internal Medicine

## 2024-07-22 ENCOUNTER — Encounter: Payer: PPO | Admitting: Internal Medicine

## 2024-07-22 VITALS — BP 120/80 | HR 77 | Temp 98.2°F | Ht 74.0 in | Wt 232.0 lb

## 2024-07-22 DIAGNOSIS — M79642 Pain in left hand: Secondary | ICD-10-CM | POA: Diagnosis not present

## 2024-07-22 DIAGNOSIS — M79641 Pain in right hand: Secondary | ICD-10-CM | POA: Diagnosis not present

## 2024-07-22 DIAGNOSIS — E7800 Pure hypercholesterolemia, unspecified: Secondary | ICD-10-CM

## 2024-07-22 DIAGNOSIS — I119 Hypertensive heart disease without heart failure: Secondary | ICD-10-CM

## 2024-07-22 DIAGNOSIS — Z0000 Encounter for general adult medical examination without abnormal findings: Secondary | ICD-10-CM

## 2024-07-22 DIAGNOSIS — C61 Malignant neoplasm of prostate: Secondary | ICD-10-CM | POA: Diagnosis not present

## 2024-07-22 DIAGNOSIS — S83203S Other tear of unspecified meniscus, current injury, right knee, sequela: Secondary | ICD-10-CM

## 2024-07-22 DIAGNOSIS — I25118 Atherosclerotic heart disease of native coronary artery with other forms of angina pectoris: Secondary | ICD-10-CM

## 2024-07-22 LAB — POCT URINALYSIS DIP (CLINITEK)
Bilirubin, UA: NEGATIVE
Glucose, UA: NEGATIVE mg/dL
Ketones, POC UA: NEGATIVE mg/dL
Leukocytes, UA: NEGATIVE
Nitrite, UA: NEGATIVE
POC PROTEIN,UA: NEGATIVE
Spec Grav, UA: 1.015 (ref 1.010–1.025)
Urobilinogen, UA: 1 U/dL
pH, UA: 7 (ref 5.0–8.0)

## 2024-07-22 LAB — RAD ONC ARIA SESSION SUMMARY
Course Elapsed Days: 12
Plan Fractions Treated to Date: 9
Plan Prescribed Dose Per Fraction: 1.8 Gy
Plan Total Fractions Prescribed: 25
Plan Total Prescribed Dose: 45 Gy
Reference Point Dosage Given to Date: 16.2 Gy
Reference Point Session Dosage Given: 1.8 Gy
Session Number: 9

## 2024-07-22 MED ORDER — TELMISARTAN 20 MG PO TABS: 20.0000 mg | ORAL_TABLET | Freq: Every day | ORAL | 2 refills | Status: AC

## 2024-07-22 NOTE — Progress Notes (Signed)
 I,Jameka J Llittleton, CMA,acting as a Neurosurgeon for Catheryn LOISE Slocumb, MD.,have documented all relevant documentation on the behalf of Catheryn LOISE Slocumb, MD,as directed by  Catheryn LOISE Slocumb, MD while in the presence of Catheryn LOISE Slocumb, MD.  Subjective:   Patient ID: Marc [name] , male    DOB: 1951-09-29 , 73 y.o.   MRN: [id number]  Chief Complaint  Patient presents with   Annual Exam    He presents today for a full physical exam.  He reports compliance with meds. He denies headaches, chest pain and shortness of breath.     Hypertension    HPI Discussed the use of AI scribe software for clinical note transcription with the patient, who gave verbal consent to proceed.  History of Present Illness Marc [name] is a 73 year old male with hypertension and prostate cancer who presents for physical exam and follow-up on blood pressure management and prostate cancer treatment.  His blood pressure has been inconsistent despite daily use of telmisartan . Readings were high in April but improved by July. Elevated readings occur at the cancer center. Current blood pressure is 120/80 mmHg.  He has been undergoing treatment for prostate cancer since April, including Orgovyx and radiation therapy, which started last week. He experiences hot flashes as a side effect. PSA levels were borderline but increased significantly last November, leading to an MRI and biopsy, revealing Gleason scores between six and eight.  He experiences poor sleep quality, waking up three to four times a night to urinate.  He is currently taking atorvastatin  40 mg Monday through Saturday, Orgovyx daily, and Xarelto  10 mg daily. He skips his cholesterol medication on Saturdays.  He has a history of a torn meniscus and arthritis in his knee, for which he received a cortisone shot last week that provided relief for three days. He plans to follow up with his orthopedic doctor in a month to discuss further treatment options.  He engages in  weight training but reports not getting enough cardio exercise due to lack of motivation.   Hypertension This is a chronic problem. The current episode started more than 1 year ago. The problem has been gradually improving since onset. The problem is controlled. Pertinent negatives include no blurred vision. Past treatments include angiotensin blockers. The current treatment provides moderate improvement. Compliance problems include diet.      Past Medical History:  Diagnosis Date   Elevated PSA    History of cocaine abuse (HCC)    Hypertension    Osteoarthritis      Family History  Problem Relation Age of Onset   Stroke Mother 75   Hypertension Mother    Healthy Father      Current Outpatient Medications:    atorvastatin  (LIPITOR) 40 MG tablet, TAKE 1 TABLET DAILY MONDAY - SATURDAY. SKIP SUNDAYS, Disp: 90 tablet, Rfl: 2   gabapentin  (NEURONTIN ) 300 MG capsule, TAKE 1 CAPSULE BY MOUTH EVERYDAY AT BEDTIME, Disp: 90 capsule, Rfl: 1   magnesium gluconate (MAGONATE) 500 MG tablet, Take 500 mg by mouth daily., Disp: , Rfl:    ORGOVYX 120 MG tablet, Take 120 mg by mouth daily., Disp: , Rfl:    rivaroxaban  (XARELTO ) 10 MG TABS tablet, Take 1 tablet (10 mg total) by mouth daily., Disp: , Rfl:    VITAMIN D , CHOLECALCIFEROL, PO, Take by mouth daily., Disp: , Rfl:    telmisartan  (MICARDIS ) 20 MG tablet, Take 1 tablet (20 mg total) by mouth daily., Disp: 90 tablet, Rfl: 2  No Known Allergies   Men's preventive visit. Patient Health Questionnaire (PHQ-2) is  Flowsheet Row Office Visit from 07/22/2024 in [facility]  PHQ-2 Total Score 0  . Patient is on a low sodium diet. Marital status: Single. Relevant history for alcohol use is:  Social History   Substance and Sexual Activity  Alcohol Use Not Currently   Comment: occasional  . Relevant history for tobacco use is:  Social History   Tobacco Use  Smoking Status Never  Smokeless Tobacco Never  .    Review of Systems  Constitutional: Negative.   HENT: Negative.    Eyes: Negative.  Negative for blurred vision.  Respiratory: Negative.    Cardiovascular: Negative.   Gastrointestinal: Negative.   Endocrine: Negative.   Genitourinary:  Positive for frequency.  Musculoskeletal:  Positive for arthralgias.       He c/o bilateral hand pain  Skin: Negative.   Neurological: Negative.   Hematological: Negative.   Psychiatric/Behavioral: Negative.       Today's Vitals   07/22/24 1408  BP: 120/80  Pulse: 77  Temp: 98.2 F (36.8 C)  TempSrc: Oral  Weight: 232 lb (105.2 kg)  Height: 6' 2 (1.88 m)  PainSc: 0-No pain   Body mass index is 29.79 kg/m.  Wt Readings from Last 3 Encounters:  07/22/24 232 lb (105.2 kg)  06/20/24 225 lb (102.1 kg)  03/04/24 231 lb (104.8 kg)   BP Readings from Last 3 Encounters:  07/22/24 120/80  06/20/24 106/87  03/04/24 (!) 166/90     Objective:  Physical Exam Vitals and nursing note reviewed.  Constitutional:      Appearance: Normal appearance.  HENT:     Head: Normocephalic and atraumatic.     Right Ear: Tympanic membrane, ear canal and external ear normal.     Left Ear: Tympanic membrane, ear canal and external ear normal.     Nose: Nose normal.     Mouth/Throat:     Mouth: Mucous membranes are moist.     Pharynx: Oropharynx is clear.  Eyes:     Extraocular Movements: Extraocular movements intact.     Conjunctiva/sclera: Conjunctivae normal.     Pupils: Pupils are equal, round, and reactive to light.  Cardiovascular:     Rate and Rhythm: Normal rate and regular rhythm.     Pulses: Normal pulses.     Heart sounds: Normal heart sounds.  Pulmonary:     Effort: Pulmonary effort is normal.     Breath sounds: Normal breath sounds.  Chest:  Breasts:    Right: Normal. No swelling, bleeding, inverted nipple, mass or nipple discharge.     Left: Normal. No swelling, bleeding, inverted nipple, mass or nipple discharge.  Abdominal:      General: Abdomen is flat. Bowel sounds are normal.     Palpations: Abdomen is soft.  Genitourinary:    Comments: Declined  Musculoskeletal:        General: Normal range of motion.     Cervical back: Normal range of motion and neck supple.     Comments: Positive squeeze test R>L MCP swelling on the right  Skin:    General: Skin is warm.  Neurological:     General: No focal deficit present.     Mental Status: He is alert.  Psychiatric:        Mood and Affect: Mood normal.        Behavior: Behavior normal.  Assessment And Plan:    Encounter for general adult medical examination w/o abnormal findings Assessment & Plan: A full exam was performed. DRE deferred, he is also followed by Urology. PATIENT IS ADVISED TO GET 30-45 MINUTES REGULAR EXERCISE NO LESS THAN FOUR TO FIVE DAYS PER WEEK - BOTH WEIGHTBEARING EXERCISES AND AEROBIC ARE RECOMMENDED.  PATIENT IS ADVISED TO FOLLOW A HEALTHY DIET WITH AT LEAST SIX FRUITS/VEGGIES PER DAY, DECREASE INTAKE OF RED MEAT, AND TO INCREASE FISH INTAKE TO TWO DAYS PER WEEK.  MEATS/FISH SHOULD NOT BE FRIED, BAKED OR BROILED IS PREFERABLE.  IT IS ALSO IMPORTANT TO CUT BACK ON YOUR SUGAR INTAKE. PLEASE AVOID ANYTHING WITH ADDED SUGAR, CORN SYRUP OR OTHER SWEETENERS. IF YOU MUST USE A SWEETENER, YOU CAN TRY STEVIA. IT IS ALSO IMPORTANT TO AVOID ARTIFICIALLY SWEETENERS AND DIET BEVERAGES. LASTLY, I SUGGEST WEARING SPF 50 SUNSCREEN ON EXPOSED PARTS AND ESPECIALLY WHEN IN THE DIRECT SUNLIGHT FOR AN EXTENDED PERIOD OF TIME.  PLEASE AVOID FAST FOOD RESTAURANTS AND INCREASE YOUR WATER INTAKE.    Hypertensive heart disease without heart failure Assessment & Plan: Chronic, fair control.   EKG performed, NSR w/ first degree AV block.  Goal BP<130/80.  He is feeling under the weather today. BP elevations possibly anxiety related. Disputed non-compliance noted.  No med changes. He will continue with telmisartan  20mg  daily. He will f/u in 4-6 months for  re-evaluation.  - Follow low sodium diet.  - Consider low dose amlodipine (2.5mg ) nightly if BP remains elevated  Orders: -     EKG 12-Lead -     POCT URINALYSIS DIP (CLINITEK) -     Microalbumin / creatinine urine ratio -     CBC -     Lipid panel -     Hepatic function panel  Coronary artery disease of native artery of native heart with stable angina pectoris (HCC) Assessment & Plan: Chronic, LDL goal<70.  He will continue with atorvastatin  40mg  Mon-Saturdays, skipping Sundays for now.  He is encouraged to follow heart healthy lifestyle.    Pure hypercholesterolemia Assessment & Plan: Chronic, LDL goal is less than 70. He will continue with atorvastatin  40mg  daily Monday thru Saturday dosing. He is encouraged to follow heart healthy lifestyle.   Orders: -     Lipid panel -     Hepatic function panel  Pain in both hands -     ANA, IFA (with reflex) -     CYCLIC CITRUL PEPTIDE ANTIBODY, IGG/IGA -     Rheumatoid factor -     Sedimentation rate -     Uric acid  Malignant neoplasm of prostate [facility]) Assessment & Plan: Prostate cancer treated with Orgovyx and radiation. Hot flashes noted as side effect. Radiation ongoing until October 9, total 40 sessions. Gleason scores 6-8. - Continue Orgovyx as prescribed. - Continue radiation therapy as scheduled.   Other tear of meniscus of right knee, unspecified meniscus, unspecified whether old or current tear, sequela Assessment & Plan: Right knee meniscal tear and osteoarthritis symptomatic post-injury. Cortisone injection provided short relief. Under Dr. Rachell care, reassessment in a month, possible arthroscopy post-radiation. - Follow up with Dr. Alucio in one month. - Consider arthroscopic intervention if symptoms persist after radiation therapy.   Other orders -     Telmisartan ; Take 1 tablet (20 mg total) by mouth daily.  Dispense: 90 tablet; Refill: 2   Return in 4 weeks (on 08/19/2024), or flu shot, for 1 year physical,  4 months bpc. Patient  was given opportunity to ask questions. Patient verbalized understanding of the plan and was able to repeat key elements of the plan. All questions were answered to their satisfaction.   I, Catheryn LOISE Slocumb, MD, have reviewed all documentation for this visit. The documentation on 07/22/24 for the exam, diagnosis, procedures, and orders are all accurate and complete.

## 2024-07-22 NOTE — Assessment & Plan Note (Signed)
 Chronic, LDL goal is less than 70. He will continue with atorvastatin 40mg  daily Monday thru Saturday dosing. He is encouraged to follow heart healthy lifestyle.

## 2024-07-22 NOTE — Patient Instructions (Signed)
 Health Maintenance, Male  Adopting a healthy lifestyle and getting preventive care are important in promoting health and wellness. Ask your health care provider about:  The right schedule for you to have regular tests and exams.  Things you can do on your own to prevent diseases and keep yourself healthy.  What should I know about diet, weight, and exercise?  Eat a healthy diet    Eat a diet that includes plenty of vegetables, fruits, low-fat dairy products, and lean protein.  Do not eat a lot of foods that are high in solid fats, added sugars, or sodium.  Maintain a healthy weight  Body mass index (BMI) is a measurement that can be used to identify possible weight problems. It estimates body fat based on height and weight. Your health care provider can help determine your BMI and help you achieve or maintain a healthy weight.  Get regular exercise  Get regular exercise. This is one of the most important things you can do for your health. Most adults should:  Exercise for at least 150 minutes each week. The exercise should increase your heart rate and make you sweat (moderate-intensity exercise).  Do strengthening exercises at least twice a week. This is in addition to the moderate-intensity exercise.  Spend less time sitting. Even light physical activity can be beneficial.  Watch cholesterol and blood lipids  Have your blood tested for lipids and cholesterol at 73 years of age, then have this test every 5 years.  You may need to have your cholesterol levels checked more often if:  Your lipid or cholesterol levels are high.  You are older than 73 years of age.  You are at high risk for heart disease.  What should I know about cancer screening?  Many types of cancers can be detected early and may often be prevented. Depending on your health history and family history, you may need to have cancer screening at various ages. This may include screening for:  Colorectal cancer.  Prostate cancer.  Skin cancer.  Lung  cancer.  What should I know about heart disease, diabetes, and high blood pressure?  Blood pressure and heart disease  High blood pressure causes heart disease and increases the risk of stroke. This is more likely to develop in people who have high blood pressure readings or are overweight.  Talk with your health care provider about your target blood pressure readings.  Have your blood pressure checked:  Every 3-5 years if you are 24-52 years of age.  Every year if you are 3 years old or older.  If you are between the ages of 60 and 72 and are a current or former smoker, ask your health care provider if you should have a one-time screening for abdominal aortic aneurysm (AAA).  Diabetes  Have regular diabetes screenings. This checks your fasting blood sugar level. Have the screening done:  Once every three years after age 66 if you are at a normal weight and have a low risk for diabetes.  More often and at a younger age if you are overweight or have a high risk for diabetes.  What should I know about preventing infection?  Hepatitis B  If you have a higher risk for hepatitis B, you should be screened for this virus. Talk with your health care provider to find out if you are at risk for hepatitis B infection.  Hepatitis C  Blood testing is recommended for:  Everyone born from [birth year] through 1965.  Anyone  with known risk factors for hepatitis C.  Sexually transmitted infections (STIs)  You should be screened each year for STIs, including gonorrhea and chlamydia, if:  You are sexually active and are younger than 73 years of age.  You are older than 73 years of age and your health care provider tells you that you are at risk for this type of infection.  Your sexual activity has changed since you were last screened, and you are at increased risk for chlamydia or gonorrhea. Ask your health care provider if you are at risk.  Ask your health care provider about whether you are at high risk for HIV. Your health care provider  may recommend a prescription medicine to help prevent HIV infection. If you choose to take medicine to prevent HIV, you should first get tested for HIV. You should then be tested every 3 months for as long as you are taking the medicine.  Follow these instructions at home:  Alcohol use  Do not drink alcohol if your health care provider tells you not to drink.  If you drink alcohol:  Limit how much you have to 0-2 drinks a day.  Know how much alcohol is in your drink. In the [place] one drink equals one 12 oz bottle of beer (355 mL), one 5 oz glass of wine (148 mL), or one 1 oz glass of hard liquor (44 mL).  Lifestyle  Do not use any products that contain nicotine or tobacco. These products include cigarettes, chewing tobacco, and vaping devices, such as e-cigarettes. If you need help quitting, ask your health care provider.  Do not use street drugs.  Do not share needles.  Ask your health care provider for help if you need support or information about quitting drugs.  General instructions  Schedule regular health, dental, and eye exams.  Stay current with your vaccines.  Tell your health care provider if:  You often feel depressed.  You have ever been abused or do not feel safe at home.  Summary  Adopting a healthy lifestyle and getting preventive care are important in promoting health and wellness.  Follow your health care provider's instructions about healthy diet, exercising, and getting tested or screened for diseases.  Follow your health care provider's instructions on monitoring your cholesterol and blood pressure.  This information is not intended to replace advice given to you by your health care provider. Make sure you discuss any questions you have with your health care provider.  Document Revised: 04/04/2021 Document Reviewed: 04/04/2021  Elsevier Patient Education  2024 [employer].

## 2024-07-23 ENCOUNTER — Ambulatory Visit
Admission: RE | Admit: 2024-07-23 | Discharge: 2024-07-23 | Disposition: A | Discharge status: Home / Self Care | Source: Ambulatory Visit | Attending: Radiation Oncology | Admitting: Radiation Oncology

## 2024-07-23 ENCOUNTER — Ambulatory Visit: Payer: Self-pay | Admitting: Internal Medicine

## 2024-07-23 ENCOUNTER — Other Ambulatory Visit: Payer: Self-pay

## 2024-07-23 DIAGNOSIS — C61 Malignant neoplasm of prostate: Secondary | ICD-10-CM | POA: Diagnosis not present

## 2024-07-23 LAB — RAD ONC ARIA SESSION SUMMARY
Course Elapsed Days: 13
Plan Fractions Treated to Date: 10
Plan Prescribed Dose Per Fraction: 1.8 Gy
Plan Total Fractions Prescribed: 25
Plan Total Prescribed Dose: 45 Gy
Reference Point Dosage Given to Date: 18 Gy
Reference Point Session Dosage Given: 1.8 Gy
Session Number: 10

## 2024-07-23 LAB — LIPID PANEL
Chol/HDL Ratio: 2.3 ratio (ref 0.0–5.0)
Cholesterol, Total: 148 mg/dL (ref 100–199)
HDL: 65 mg/dL (ref >39)
LDL Chol Calc (NIH): 68 mg/dL (ref 0–99)
Triglycerides: 77 mg/dL (ref 0–149)
VLDL Cholesterol Cal: 15 mg/dL (ref 5–40)

## 2024-07-23 LAB — CBC
Hematocrit: 40.2 % (ref 37.5–51.0)
Hemoglobin: 13.1 g/dL (ref 13.0–17.7)
MCH: 29.2 pg (ref 26.6–33.0)
MCHC: 32.6 g/dL (ref 31.5–35.7)
MCV: 90 fL (ref 79–97)
Platelets: 109 x10E3/uL — ABNORMAL LOW (ref 150–450)
RBC: 4.48 x10E6/uL (ref 4.14–5.80)
RDW: 13 % (ref 11.6–15.4)
WBC: 4.6 x10E3/uL (ref 3.4–10.8)

## 2024-07-23 LAB — MICROALBUMIN / CREATININE URINE RATIO
Creatinine, Urine: 66.8 mg/dL
Microalb/Creat Ratio: <4 mg/g{creat} (ref 0–29)
Microalbumin, Urine: <3 ug/mL

## 2024-07-23 LAB — HEPATIC FUNCTION PANEL
ALT: 22 IU/L (ref 0–44)
AST: 27 IU/L (ref 0–40)
Albumin: 4.1 g/dL (ref 3.8–4.8)
Alkaline Phosphatase: 73 IU/L (ref 44–121)
Bilirubin Total: 0.5 mg/dL (ref 0.0–1.2)
Bilirubin, Direct: 0.21 mg/dL (ref 0.00–0.40)
Total Protein: 6.7 g/dL (ref 6.0–8.5)

## 2024-07-23 LAB — RHEUMATOID FACTOR: Rheumatoid fact SerPl-aCnc: <10 [IU]/mL (ref <14.0)

## 2024-07-23 LAB — ANTINUCLEAR ANTIBODIES, IFA: ANA Titer 1: NEGATIVE

## 2024-07-23 LAB — SEDIMENTATION RATE: Sed Rate: 5 mm/h (ref 0–30)

## 2024-07-23 LAB — URIC ACID: Uric Acid: 4.3 mg/dL (ref 3.8–8.4)

## 2024-07-23 LAB — CYCLIC CITRUL PEPTIDE ANTIBODY, IGG/IGA: Cyclic Citrullin Peptide Ab: 12 U (ref 0–19)

## 2024-07-24 ENCOUNTER — Other Ambulatory Visit: Payer: Self-pay

## 2024-07-24 ENCOUNTER — Ambulatory Visit
Admission: RE | Admit: 2024-07-24 | Discharge: 2024-07-24 | Disposition: A | Discharge status: Home / Self Care | Source: Ambulatory Visit | Attending: Radiation Oncology | Admitting: Radiation Oncology

## 2024-07-24 DIAGNOSIS — C61 Malignant neoplasm of prostate: Secondary | ICD-10-CM | POA: Diagnosis not present

## 2024-07-24 LAB — RAD ONC ARIA SESSION SUMMARY
Course Elapsed Days: 14
Plan Fractions Treated to Date: 11
Plan Prescribed Dose Per Fraction: 1.8 Gy
Plan Total Fractions Prescribed: 25
Plan Total Prescribed Dose: 45 Gy
Reference Point Dosage Given to Date: 19.8 Gy
Reference Point Session Dosage Given: 1.8 Gy
Session Number: 11

## 2024-07-25 ENCOUNTER — Other Ambulatory Visit: Payer: Self-pay

## 2024-07-25 ENCOUNTER — Ambulatory Visit
Admission: RE | Admit: 2024-07-25 | Discharge: 2024-07-25 | Disposition: A | Discharge status: Home / Self Care | Source: Ambulatory Visit | Attending: Radiation Oncology | Admitting: Radiation Oncology

## 2024-07-25 DIAGNOSIS — C61 Malignant neoplasm of prostate: Secondary | ICD-10-CM | POA: Diagnosis not present

## 2024-07-25 LAB — RAD ONC ARIA SESSION SUMMARY
Course Elapsed Days: 15
Plan Fractions Treated to Date: 12
Plan Prescribed Dose Per Fraction: 1.8 Gy
Plan Total Fractions Prescribed: 25
Plan Total Prescribed Dose: 45 Gy
Reference Point Dosage Given to Date: 21.6 Gy
Reference Point Session Dosage Given: 1.8 Gy
Session Number: 12

## 2024-07-27 DIAGNOSIS — S83206A Unspecified tear of unspecified meniscus, current injury, right knee, initial encounter: Secondary | ICD-10-CM | POA: Insufficient documentation

## 2024-07-27 NOTE — Assessment & Plan Note (Signed)
 Chronic, fair control.   EKG performed, NSR w/ first degree AV block.  Goal BP<130/80.  He is feeling under the weather today. BP elevations possibly anxiety related. Disputed non-compliance noted.  No med changes. He will continue with telmisartan  20mg  daily. He will f/u in 4-6 months for re-evaluation.  - Follow low sodium diet.  - Consider low dose amlodipine (2.5mg ) nightly if BP remains elevated

## 2024-07-27 NOTE — Assessment & Plan Note (Signed)
 Chronic, LDL goal<70.  He will continue with atorvastatin  40mg  Mon-Saturdays, skipping Sundays for now.  He is encouraged to follow heart healthy lifestyle.

## 2024-07-27 NOTE — Assessment & Plan Note (Signed)
A full exam was performed. DRE deferred, he is also followed by Urology. PATIENT IS ADVISED TO GET 30-45 MINUTES REGULAR EXERCISE NO LESS THAN FOUR TO FIVE DAYS PER WEEK - BOTH WEIGHTBEARING EXERCISES AND AEROBIC ARE RECOMMENDED.  PATIENT IS ADVISED TO FOLLOW A HEALTHY DIET WITH AT LEAST SIX FRUITS/VEGGIES PER DAY, DECREASE INTAKE OF RED MEAT, AND TO INCREASE FISH INTAKE TO TWO DAYS PER WEEK.  MEATS/FISH SHOULD NOT BE FRIED, BAKED OR BROILED IS PREFERABLE.  IT IS ALSO IMPORTANT TO CUT BACK ON YOUR SUGAR INTAKE. PLEASE AVOID ANYTHING WITH ADDED SUGAR, CORN SYRUP OR OTHER SWEETENERS. IF YOU MUST USE A SWEETENER, YOU CAN TRY STEVIA. IT IS ALSO IMPORTANT TO AVOID ARTIFICIALLY SWEETENERS AND DIET BEVERAGES. LASTLY, I SUGGEST WEARING SPF 50 SUNSCREEN ON EXPOSED PARTS AND ESPECIALLY WHEN IN THE DIRECT SUNLIGHT FOR AN EXTENDED PERIOD OF TIME.  PLEASE AVOID FAST FOOD RESTAURANTS AND INCREASE YOUR WATER INTAKE.

## 2024-07-27 NOTE — Assessment & Plan Note (Signed)
 Prostate cancer treated with Orgovyx and radiation. Hot flashes noted as side effect. Radiation ongoing until October 9, total 40 sessions. Gleason scores 6-8. - Continue Orgovyx as prescribed. - Continue radiation therapy as scheduled.

## 2024-07-27 NOTE — Assessment & Plan Note (Signed)
 Right knee meniscal tear and osteoarthritis symptomatic post-injury. Cortisone injection provided short relief. Under Dr. Rachell care, reassessment in a month, possible arthroscopy post-radiation. - Follow up with Dr. Alucio in one month. - Consider arthroscopic intervention if symptoms persist after radiation therapy.

## 2024-07-28 ENCOUNTER — Other Ambulatory Visit: Payer: Self-pay | Admitting: Internal Medicine

## 2024-07-29 ENCOUNTER — Other Ambulatory Visit: Payer: Self-pay

## 2024-07-29 ENCOUNTER — Ambulatory Visit
Admission: RE | Admit: 2024-07-29 | Discharge: 2024-07-29 | Disposition: A | Discharge status: Home / Self Care | Source: Ambulatory Visit | Attending: Radiation Oncology | Admitting: Radiation Oncology

## 2024-07-29 DIAGNOSIS — C61 Malignant neoplasm of prostate: Secondary | ICD-10-CM | POA: Diagnosis present

## 2024-07-29 DIAGNOSIS — Z510 Encounter for antineoplastic radiation therapy: Secondary | ICD-10-CM | POA: Diagnosis present

## 2024-07-29 LAB — RAD ONC ARIA SESSION SUMMARY
Course Elapsed Days: 19
Plan Fractions Treated to Date: 13
Plan Prescribed Dose Per Fraction: 1.8 Gy
Plan Total Fractions Prescribed: 25
Plan Total Prescribed Dose: 45 Gy
Reference Point Dosage Given to Date: 23.4 Gy
Reference Point Session Dosage Given: 1.8 Gy
Session Number: 13

## 2024-07-30 ENCOUNTER — Other Ambulatory Visit: Payer: Self-pay

## 2024-07-30 ENCOUNTER — Ambulatory Visit
Admission: RE | Admit: 2024-07-30 | Discharge: 2024-07-30 | Disposition: A | Discharge status: Home / Self Care | Source: Ambulatory Visit | Attending: Radiation Oncology | Admitting: Radiation Oncology

## 2024-07-30 DIAGNOSIS — Z510 Encounter for antineoplastic radiation therapy: Secondary | ICD-10-CM | POA: Diagnosis not present

## 2024-07-30 LAB — RAD ONC ARIA SESSION SUMMARY
Course Elapsed Days: 20
Plan Fractions Treated to Date: 14
Plan Prescribed Dose Per Fraction: 1.8 Gy
Plan Total Fractions Prescribed: 25
Plan Total Prescribed Dose: 45 Gy
Reference Point Dosage Given to Date: 25.2 Gy
Reference Point Session Dosage Given: 1.8 Gy
Session Number: 14

## 2024-07-31 ENCOUNTER — Ambulatory Visit: Admitting: Hematology and Oncology

## 2024-07-31 ENCOUNTER — Other Ambulatory Visit: Payer: Self-pay

## 2024-07-31 ENCOUNTER — Ambulatory Visit
Admission: RE | Admit: 2024-07-31 | Discharge: 2024-07-31 | Disposition: A | Discharge status: Home / Self Care | Source: Ambulatory Visit | Attending: Radiation Oncology | Admitting: Radiation Oncology

## 2024-07-31 ENCOUNTER — Inpatient Hospital Stay: Attending: Hematology and Oncology

## 2024-07-31 ENCOUNTER — Other Ambulatory Visit: Payer: Self-pay | Admitting: Hematology and Oncology

## 2024-07-31 ENCOUNTER — Ambulatory Visit: Admitting: Internal Medicine

## 2024-07-31 DIAGNOSIS — Z86711 Personal history of pulmonary embolism: Secondary | ICD-10-CM

## 2024-07-31 DIAGNOSIS — I2699 Other pulmonary embolism without acute cor pulmonale: Secondary | ICD-10-CM

## 2024-07-31 DIAGNOSIS — Z510 Encounter for antineoplastic radiation therapy: Secondary | ICD-10-CM | POA: Diagnosis not present

## 2024-07-31 LAB — RAD ONC ARIA SESSION SUMMARY
Course Elapsed Days: 21
Plan Fractions Treated to Date: 15
Plan Prescribed Dose Per Fraction: 1.8 Gy
Plan Total Fractions Prescribed: 25
Plan Total Prescribed Dose: 45 Gy
Reference Point Dosage Given to Date: 27 Gy
Reference Point Session Dosage Given: 1.8 Gy
Session Number: 15

## 2024-07-31 NOTE — Progress Notes (Deleted)
 [facility] [phone]   [phone]  PROGRESS NOTE  Patient Care Team: Jarold Medici, MD as PCP - General (Internal Medicine) Delford Maude BROCKS, MD as PCP - Cardiology (Cardiology) Vertell Pont, RN as Oncology Nurse Navigator  Hematological/Oncological History # Bilateral Pulmonary Emboli # Bilateral Lower Extremity DVTs 06/24/2021: Patient underwent a CT coronary morphology with CTA and was incidentally found to have filling defects in the pulmonary arteries bilaterally consistent with pulmonary emboli.  Patient started on Xarelto  therapy. 06/30/2021: Ultrasound of lower extremities bilaterally showed on the right side acute deep vein thrombosis involving the popliteal, right femoral vein as well as on the left side acute deep vein thrombosis involving the left femoral vein, left popliteal vein, and left peroneal veins 01/13/2022: Repeat lower extremity ultrasound showed recannulized thrombus in the left popliteal veins findings consistent with chronic deep vein thrombosis. 01/26/2022: Establish care with Dr. Federico.  Interval History:  Marc [name] 73 y.o. male with medical history significant for bilateral pulmonary emboli and DVTs who presents for a follow up visit. The patient's last visit was on 07/28/2022. In the interim since the last visit he has continued on Xarelto  therapy.  On exam today [name] notes he has had no major changes in his health in the interim since her last visit.  He reports that he is tolerating his Xarelto  therapy well with no trouble.  He is not having any bleeding, bruising, or dark stools.  He reports that he gets a cut or scratch he is able to control the bleeding without difficulty.  He notes that the cost of the medication is about $10 per month.  He reports it is not bad at all.  He notes he has no signs or symptoms concerning for recurrent VTE.  He has no upcoming planned dental or surgical visits.  He reports that he  continues to workout 5 days a week at the gym.  He is doing bike riding, swimming, and his goal is to try to get his weight down further, to at least 225.  He notes he is staying away from sweets.  Overall he feels well and is willing and able to continue on Xarelto  therapy at this time.  He otherwise denies any fevers, chills, sweats, nausea, vomiting or diarrhea.  Full 10 point ROS is listed below.  MEDICAL HISTORY:  Past Medical History:  Diagnosis Date   Elevated PSA    History of cocaine abuse (HCC)    Hypertension    Osteoarthritis     SURGICAL HISTORY: Past Surgical History:  Procedure Laterality Date   GOLD SEED IMPLANT N/A 06/20/2024   Procedure: INSERTION, GOLD SEEDS;  Surgeon: Nieves Cough, MD;  Location: [facility] OR;  Service: Urology;  Laterality: N/A;   JOINT REPLACEMENT  06/22/11   KNEE ARTHROPLASTY Left 06/21/2011   Dempsey Moan, MD   PROSTATE BIOPSY     SPACE OAR INSTILLATION N/A 06/20/2024   Procedure: INJECTION, HYDROGEL SPACER;  Surgeon: Nieves Cough, MD;  Location: [facility] OR;  Service: Urology;  Laterality: N/A;    SOCIAL HISTORY: Social History   Socioeconomic History   Marital status: Single    Spouse name: Not on file   Number of children: 0   Years of education: [employer]   Highest education level: Bachelor's degree (e.g., BA, AB, BS)  Occupational History   Occupation: Scientist, forensic: UNEMPLOYED    Comment: since 2009   Occupation: retired  Tobacco Use   Smoking status:  Never   Smokeless tobacco: Never  Vaping Use   Vaping status: Never Used  Substance and Sexual Activity   Alcohol use: Not Currently    Comment: occasional   Drug use: Not Currently    Comment: Last in Nov 2013   Sexual activity: Not Currently    Birth control/protection: Condom  Other Topics Concern   Not on file  Social History Narrative   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 73 yo)   Social Drivers of Corporate investment banker Strain: Medium Risk (12/31/2023)    Overall Financial Resource Strain (CARDIA)    Difficulty of Paying Living Expenses: Somewhat hard  Food Insecurity: Food Insecurity Present (03/04/2024)   Hunger Vital Sign    Worried About Running Out of Food in the Last Year: Sometimes true    Ran Out of Food in the Last Year: Sometimes true  Transportation Needs: No Transportation Needs (03/04/2024)   PRAPARE - Administrator, Civil Service (Medical): No    Lack of Transportation (Non-Medical): No  Physical Activity: Sufficiently Active (12/31/2023)   Exercise Vital Sign    Days of Exercise per Week: 5 days    Minutes of Exercise per Session: 90 min  Stress: No Stress Concern Present (12/31/2023)   [employer] of Occupational Health - Occupational Stress Questionnaire    Feeling of Stress : Only a little  Social Connections: Moderately Isolated (12/31/2023)   Social Connection and Isolation Panel    Frequency of Communication with Friends and Family: Three times a week    Frequency of Social Gatherings with Friends and Family: Once a week    Attends Religious Services: More than 4 times per year    Active Member of [employer] or Organizations: No    Attends Banker Meetings: Never    Marital Status: Never married  Intimate Partner Violence: Not At Risk (03/04/2024)   Humiliation, Afraid, Rape, and Kick questionnaire    Fear of Current or Ex-Partner: No    Emotionally Abused: No    Physically Abused: No    Sexually Abused: No    FAMILY HISTORY: Family History  Problem Relation Age of Onset   Stroke Mother 76   Hypertension Mother    Healthy Father     ALLERGIES:  has no known allergies.  MEDICATIONS:  Current Outpatient Medications  Medication Sig Dispense Refill   atorvastatin  (LIPITOR) 40 MG tablet TAKE 1 TABLET DAILY MONDAY - SATURDAY. SKIP SUNDAYS 90 tablet 2   gabapentin  (NEURONTIN ) 300 MG capsule TAKE 1 CAPSULE BY MOUTH EVERYDAY AT BEDTIME 90 capsule 1   magnesium gluconate (MAGONATE) 500 MG tablet  Take 500 mg by mouth daily.     ORGOVYX 120 MG tablet Take 120 mg by mouth daily.     rivaroxaban  (XARELTO ) 10 MG TABS tablet Take 1 tablet (10 mg total) by mouth daily.     telmisartan  (MICARDIS ) 20 MG tablet Take 1 tablet (20 mg total) by mouth daily. 90 tablet 2   VITAMIN D , CHOLECALCIFEROL, PO Take by mouth daily.     No current facility-administered medications for this visit.    REVIEW OF SYSTEMS:   Constitutional: ( - ) fevers, ( - )  chills , ( - ) night sweats Eyes: ( - ) blurriness of vision, ( - ) double vision, ( - ) watery eyes Ears, nose, mouth, throat, and face: ( - ) mucositis, ( - ) sore throat Respiratory: ( - ) cough, ( - ) dyspnea, ( - )  wheezes Cardiovascular: ( - ) palpitation, ( - ) chest discomfort, ( - ) lower extremity swelling Gastrointestinal:  ( - ) nausea, ( - ) heartburn, ( - ) change in bowel habits Skin: ( - ) abnormal skin rashes Lymphatics: ( - ) new lymphadenopathy, ( - ) easy bruising Neurological: ( - ) numbness, ( - ) tingling, ( - ) new weaknesses Behavioral/Psych: ( - ) mood change, ( - ) new changes  All other systems were reviewed with the patient and are negative.  PHYSICAL EXAMINATION:  There were no vitals filed for this visit.  There were no vitals filed for this visit.   GENERAL: Well-appearing elderly African-American male, alert, no distress and comfortable SKIN: skin color, texture, turgor are normal, no rashes or significant lesions EYES: conjunctiva are pink and non-injected, sclera clear LUNGS: clear to auscultation and percussion with normal breathing effort HEART: regular rate & rhythm and no murmurs and no lower extremity edema on right leg, chronic swelling of left leg.  Musculoskeletal: no cyanosis of digits and no clubbing  PSYCH: alert & oriented x 3, fluent speech NEURO: no focal motor/sensory deficits  LABORATORY DATA:  I have reviewed the data as listed    Latest Ref Rng & Units 07/22/2024    3:34 PM 01/29/2024    11:13 AM 08/03/2023   11:01 AM  CBC  WBC 3.4 - 10.8 x10E3/uL 4.6  5.2  4.6   Hemoglobin 13.0 - 17.7 g/dL 86.8  85.8  86.0   Hematocrit 37.5 - 51.0 % 40.2  43.3  41.8   Platelets 150 - 450 x10E3/uL 109  196  165        Latest Ref Rng & Units 07/22/2024    3:34 PM 06/20/2024    6:30 AM 01/29/2024   11:13 AM  CMP  Glucose 70 - 99 mg/dL  85  82   BUN 8 - 23 mg/dL  14  14   Creatinine 9.38 - 1.24 mg/dL  9.09  9.06   Sodium 864 - 145 mmol/L  139  138   Potassium 3.5 - 5.1 mmol/L  4.2  4.6   Chloride 98 - 111 mmol/L  106  104   CO2 22 - 32 mmol/L  23  30   Calcium  8.9 - 10.3 mg/dL  9.5  9.5   Total Protein 6.0 - 8.5 g/dL 6.7   7.5   Total Bilirubin 0.0 - 1.2 mg/dL 0.5   0.8   Alkaline Phos 44 - 121 IU/L 73   61   AST 0 - 40 IU/L 27   24   ALT 0 - 44 IU/L 22   17     RADIOGRAPHIC STUDIES: No results found.  ASSESSMENT & PLAN Marc [name] 73 y.o. male with medical history significant for bilateral pulmonary emboli and DVTs who presents for a follow up visit.  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   A provoked venous thromboembolism (VTE) is one that has a clear inciting factor or event. Provoking factors include prolonged travel/immobility, surgery (particular abdominal or orthropedic), trauma,  and pregnancy/ estrogen containing birth control. After a detailed history and review of the records there is no clear provoking factor for this patient's VTE.  Patients with unprovoked VTEs have up to 25% recurrence after 5 years and 36% at 10 years, with 4% of these clots being fatal (  BMJ?2019;366:l4363). Therefore the formal recommendation for unprovoked VTE's is lifelong anticoagulation, as the cause may not be transient or reversible. We recommend 6 months or full strength anticoagulation with a re-evaluation after that time.  The patient's will then have a  choice of maintenance dose DOAC (preferred, recommended), 81mg  ASA PO daily (non-preferred), or no further anticoagulation (not recommended).     # Unprovoked DVT/Pulmonary Embolism  --findings at this time are consistent with a unprovoked VTE  --will order CMP and CBC to assure labs are adequate for DOAC therapy  --ruled out APS with anticardiolipin and anti beta2 glycoprotein antibodies.  Lupus anticoagulant panel would be altered by presence of blood thinner, will hold on this testing.   --recommend the patient continue Xarelto  10 mg p.o. daily for maintenance therapy. --patient denies any bleeding, bruising, or dark stools on this medication. It is well tolerated. No difficulties accessing/affording the medication  --Labs today show white blood cell count 4.6, hemoglobin 13.9, MCV 86.4, and platelets of 165 --RTC in 6 months' time with strict return precautions for overt signs of bleeding.    No orders of the defined types were placed in this encounter.   All questions were answered. The patient knows to call the clinic with any problems, questions or concerns.  A total of more than 25 minutes were spent on this encounter with face-to-face time and non-face-to-face time, including preparing to see the patient, ordering tests and/or medications, counseling the patient and coordination of care as outlined above.   Norleen IVAR Kidney, MD Department of Hematology/Oncology [facility] Phone: [phone] Pager: [phone] Email: [email or web address]  07/31/2024 7:42 AM

## 2024-08-01 ENCOUNTER — Ambulatory Visit
Admission: RE | Admit: 2024-08-01 | Discharge: 2024-08-01 | Disposition: A | Discharge status: Home / Self Care | Source: Ambulatory Visit | Attending: Radiation Oncology | Admitting: Radiation Oncology

## 2024-08-01 ENCOUNTER — Other Ambulatory Visit: Payer: Self-pay

## 2024-08-01 DIAGNOSIS — Z510 Encounter for antineoplastic radiation therapy: Secondary | ICD-10-CM | POA: Diagnosis not present

## 2024-08-01 LAB — RAD ONC ARIA SESSION SUMMARY
Course Elapsed Days: 22
Plan Fractions Treated to Date: 16
Plan Prescribed Dose Per Fraction: 1.8 Gy
Plan Total Fractions Prescribed: 25
Plan Total Prescribed Dose: 45 Gy
Reference Point Dosage Given to Date: 28.8 Gy
Reference Point Session Dosage Given: 1.8 Gy
Session Number: 16

## 2024-08-04 ENCOUNTER — Other Ambulatory Visit: Payer: Self-pay

## 2024-08-04 ENCOUNTER — Ambulatory Visit
Admission: RE | Admit: 2024-08-04 | Discharge: 2024-08-04 | Disposition: A | Discharge status: Home / Self Care | Source: Ambulatory Visit | Attending: Radiation Oncology

## 2024-08-04 DIAGNOSIS — Z510 Encounter for antineoplastic radiation therapy: Secondary | ICD-10-CM | POA: Diagnosis not present

## 2024-08-04 LAB — RAD ONC ARIA SESSION SUMMARY
Course Elapsed Days: 25
Plan Fractions Treated to Date: 17
Plan Prescribed Dose Per Fraction: 1.8 Gy
Plan Total Fractions Prescribed: 25
Plan Total Prescribed Dose: 45 Gy
Reference Point Dosage Given to Date: 30.6 Gy
Reference Point Session Dosage Given: 1.8 Gy
Session Number: 17

## 2024-08-05 ENCOUNTER — Ambulatory Visit
Admission: RE | Admit: 2024-08-05 | Discharge: 2024-08-05 | Disposition: A | Discharge status: Home / Self Care | Source: Ambulatory Visit | Attending: Radiation Oncology

## 2024-08-05 ENCOUNTER — Other Ambulatory Visit: Payer: Self-pay

## 2024-08-05 DIAGNOSIS — Z510 Encounter for antineoplastic radiation therapy: Secondary | ICD-10-CM | POA: Diagnosis not present

## 2024-08-05 LAB — RAD ONC ARIA SESSION SUMMARY
Course Elapsed Days: 26
Plan Fractions Treated to Date: 18
Plan Prescribed Dose Per Fraction: 1.8 Gy
Plan Total Fractions Prescribed: 25
Plan Total Prescribed Dose: 45 Gy
Reference Point Dosage Given to Date: 32.4 Gy
Reference Point Session Dosage Given: 1.8 Gy
Session Number: 18

## 2024-08-06 ENCOUNTER — Ambulatory Visit
Admission: RE | Admit: 2024-08-06 | Discharge: 2024-08-06 | Disposition: A | Discharge status: Home / Self Care | Source: Ambulatory Visit | Attending: Radiation Oncology | Admitting: Radiation Oncology

## 2024-08-06 ENCOUNTER — Other Ambulatory Visit: Payer: Self-pay

## 2024-08-06 DIAGNOSIS — Z510 Encounter for antineoplastic radiation therapy: Secondary | ICD-10-CM | POA: Diagnosis not present

## 2024-08-06 LAB — RAD ONC ARIA SESSION SUMMARY
Course Elapsed Days: 27
Plan Fractions Treated to Date: 19
Plan Prescribed Dose Per Fraction: 1.8 Gy
Plan Total Fractions Prescribed: 25
Plan Total Prescribed Dose: 45 Gy
Reference Point Dosage Given to Date: 34.2 Gy
Reference Point Session Dosage Given: 1.8 Gy
Session Number: 19

## 2024-08-07 ENCOUNTER — Other Ambulatory Visit: Payer: Self-pay

## 2024-08-07 ENCOUNTER — Ambulatory Visit
Admission: RE | Admit: 2024-08-07 | Discharge: 2024-08-07 | Disposition: A | Discharge status: Home / Self Care | Source: Ambulatory Visit | Attending: Radiation Oncology

## 2024-08-07 ENCOUNTER — Ambulatory Visit

## 2024-08-07 DIAGNOSIS — Z510 Encounter for antineoplastic radiation therapy: Secondary | ICD-10-CM | POA: Diagnosis not present

## 2024-08-07 LAB — RAD ONC ARIA SESSION SUMMARY
Course Elapsed Days: 28
Plan Fractions Treated to Date: 20
Plan Prescribed Dose Per Fraction: 1.8 Gy
Plan Total Fractions Prescribed: 25
Plan Total Prescribed Dose: 45 Gy
Reference Point Dosage Given to Date: 36 Gy
Reference Point Session Dosage Given: 1.8 Gy
Session Number: 20

## 2024-08-08 ENCOUNTER — Ambulatory Visit
Admission: RE | Admit: 2024-08-08 | Discharge: 2024-08-08 | Disposition: A | Discharge status: Home / Self Care | Source: Ambulatory Visit | Attending: Radiation Oncology | Admitting: Radiation Oncology

## 2024-08-08 ENCOUNTER — Other Ambulatory Visit: Payer: Self-pay

## 2024-08-08 ENCOUNTER — Ambulatory Visit

## 2024-08-08 DIAGNOSIS — M23241 Derangement of anterior horn of lateral meniscus due to old tear or injury, right knee: Secondary | ICD-10-CM | POA: Diagnosis not present

## 2024-08-08 DIAGNOSIS — Z510 Encounter for antineoplastic radiation therapy: Secondary | ICD-10-CM | POA: Diagnosis not present

## 2024-08-08 LAB — RAD ONC ARIA SESSION SUMMARY
Course Elapsed Days: 29
Plan Fractions Treated to Date: 21
Plan Prescribed Dose Per Fraction: 1.8 Gy
Plan Total Fractions Prescribed: 25
Plan Total Prescribed Dose: 45 Gy
Reference Point Dosage Given to Date: 37.8 Gy
Reference Point Session Dosage Given: 1.8 Gy
Session Number: 21

## 2024-08-11 ENCOUNTER — Ambulatory Visit
Admission: RE | Admit: 2024-08-11 | Discharge: 2024-08-11 | Disposition: A | Discharge status: Home / Self Care | Source: Ambulatory Visit | Attending: Radiation Oncology

## 2024-08-11 ENCOUNTER — Other Ambulatory Visit: Payer: Self-pay

## 2024-08-11 ENCOUNTER — Ambulatory Visit
Admission: RE | Admit: 2024-08-11 | Discharge: 2024-08-11 | Disposition: A | Discharge status: Home / Self Care | Source: Ambulatory Visit | Attending: Radiation Oncology | Admitting: Radiation Oncology

## 2024-08-11 DIAGNOSIS — Z510 Encounter for antineoplastic radiation therapy: Secondary | ICD-10-CM | POA: Diagnosis not present

## 2024-08-11 LAB — RAD ONC ARIA SESSION SUMMARY
Course Elapsed Days: 32
Plan Fractions Treated to Date: 22
Plan Prescribed Dose Per Fraction: 1.8 Gy
Plan Total Fractions Prescribed: 25
Plan Total Prescribed Dose: 45 Gy
Reference Point Dosage Given to Date: 39.6 Gy
Reference Point Session Dosage Given: 1.8 Gy
Session Number: 22

## 2024-08-12 ENCOUNTER — Ambulatory Visit

## 2024-08-13 ENCOUNTER — Ambulatory Visit
Admission: RE | Admit: 2024-08-13 | Discharge: 2024-08-13 | Disposition: A | Discharge status: Home / Self Care | Source: Ambulatory Visit | Attending: Radiation Oncology

## 2024-08-13 ENCOUNTER — Other Ambulatory Visit: Payer: Self-pay

## 2024-08-13 DIAGNOSIS — Z510 Encounter for antineoplastic radiation therapy: Secondary | ICD-10-CM | POA: Diagnosis not present

## 2024-08-13 LAB — RAD ONC ARIA SESSION SUMMARY
Course Elapsed Days: 34
Plan Fractions Treated to Date: 23
Plan Prescribed Dose Per Fraction: 1.8 Gy
Plan Total Fractions Prescribed: 25
Plan Total Prescribed Dose: 45 Gy
Reference Point Dosage Given to Date: 41.4 Gy
Reference Point Session Dosage Given: 1.8 Gy
Session Number: 23

## 2024-08-14 ENCOUNTER — Ambulatory Visit
Admission: RE | Admit: 2024-08-14 | Discharge: 2024-08-14 | Disposition: A | Discharge status: Home / Self Care | Source: Ambulatory Visit | Attending: Radiation Oncology | Admitting: Radiation Oncology

## 2024-08-14 ENCOUNTER — Other Ambulatory Visit: Payer: Self-pay

## 2024-08-14 DIAGNOSIS — Z510 Encounter for antineoplastic radiation therapy: Secondary | ICD-10-CM | POA: Diagnosis not present

## 2024-08-14 LAB — RAD ONC ARIA SESSION SUMMARY
Course Elapsed Days: 35
Plan Fractions Treated to Date: 24
Plan Prescribed Dose Per Fraction: 1.8 Gy
Plan Total Fractions Prescribed: 25
Plan Total Prescribed Dose: 45 Gy
Reference Point Dosage Given to Date: 43.2 Gy
Reference Point Session Dosage Given: 1.8 Gy
Session Number: 24

## 2024-08-15 ENCOUNTER — Ambulatory Visit
Admission: RE | Admit: 2024-08-15 | Discharge: 2024-08-15 | Disposition: A | Discharge status: Home / Self Care | Source: Ambulatory Visit | Attending: Radiation Oncology | Admitting: Radiation Oncology

## 2024-08-15 ENCOUNTER — Other Ambulatory Visit: Payer: Self-pay

## 2024-08-15 ENCOUNTER — Ambulatory Visit

## 2024-08-15 DIAGNOSIS — Z510 Encounter for antineoplastic radiation therapy: Secondary | ICD-10-CM | POA: Diagnosis not present

## 2024-08-15 LAB — RAD ONC ARIA SESSION SUMMARY
Course Elapsed Days: 36
Plan Fractions Treated to Date: 25
Plan Prescribed Dose Per Fraction: 1.8 Gy
Plan Total Fractions Prescribed: 25
Plan Total Prescribed Dose: 45 Gy
Reference Point Dosage Given to Date: 45 Gy
Reference Point Session Dosage Given: 1.8 Gy
Session Number: 25

## 2024-08-18 ENCOUNTER — Ambulatory Visit
Admission: RE | Admit: 2024-08-18 | Discharge: 2024-08-18 | Disposition: A | Discharge status: Home / Self Care | Source: Ambulatory Visit | Attending: Radiation Oncology

## 2024-08-18 ENCOUNTER — Other Ambulatory Visit: Payer: Self-pay

## 2024-08-18 DIAGNOSIS — Z510 Encounter for antineoplastic radiation therapy: Secondary | ICD-10-CM | POA: Diagnosis not present

## 2024-08-18 LAB — RAD ONC ARIA SESSION SUMMARY
Course Elapsed Days: 39
Plan Fractions Treated to Date: 1
Plan Prescribed Dose Per Fraction: 2 Gy
Plan Total Fractions Prescribed: 15
Plan Total Prescribed Dose: 30 Gy
Reference Point Dosage Given to Date: 2 Gy
Reference Point Session Dosage Given: 2 Gy
Session Number: 26

## 2024-08-19 ENCOUNTER — Ambulatory Visit
Admission: RE | Admit: 2024-08-19 | Discharge: 2024-08-19 | Disposition: A | Discharge status: Home / Self Care | Source: Ambulatory Visit | Attending: Radiation Oncology | Admitting: Radiation Oncology

## 2024-08-19 ENCOUNTER — Other Ambulatory Visit: Payer: Self-pay

## 2024-08-19 ENCOUNTER — Ambulatory Visit (INDEPENDENT_AMBULATORY_CARE_PROVIDER_SITE_OTHER)

## 2024-08-19 VITALS — BP 110/70 | HR 84 | Temp 98.1°F | Ht 74.0 in | Wt 232.0 lb

## 2024-08-19 DIAGNOSIS — D696 Thrombocytopenia, unspecified: Secondary | ICD-10-CM

## 2024-08-19 DIAGNOSIS — Z510 Encounter for antineoplastic radiation therapy: Secondary | ICD-10-CM | POA: Diagnosis not present

## 2024-08-19 DIAGNOSIS — Z23 Encounter for immunization: Secondary | ICD-10-CM

## 2024-08-19 LAB — RAD ONC ARIA SESSION SUMMARY
Course Elapsed Days: 40
Plan Fractions Treated to Date: 2
Plan Prescribed Dose Per Fraction: 2 Gy
Plan Total Fractions Prescribed: 15
Plan Total Prescribed Dose: 30 Gy
Reference Point Dosage Given to Date: 4 Gy
Reference Point Session Dosage Given: 2 Gy
Session Number: 27

## 2024-08-19 NOTE — Patient Instructions (Signed)

## 2024-08-19 NOTE — Progress Notes (Signed)
 Patient is in office today for a nurse visit for FLU Immunization. Patient Injection was given in the  Right deltoid. Patient tolerated injection well.

## 2024-08-20 ENCOUNTER — Ambulatory Visit (INDEPENDENT_AMBULATORY_CARE_PROVIDER_SITE_OTHER): Payer: PPO

## 2024-08-20 ENCOUNTER — Ambulatory Visit: Payer: Self-pay | Admitting: Internal Medicine

## 2024-08-20 ENCOUNTER — Other Ambulatory Visit: Payer: Self-pay

## 2024-08-20 ENCOUNTER — Ambulatory Visit
Admission: RE | Admit: 2024-08-20 | Discharge: 2024-08-20 | Disposition: A | Discharge status: Home / Self Care | Source: Ambulatory Visit | Attending: Radiation Oncology

## 2024-08-20 DIAGNOSIS — Z0000 Encounter for general adult medical examination without abnormal findings: Secondary | ICD-10-CM

## 2024-08-20 DIAGNOSIS — Z510 Encounter for antineoplastic radiation therapy: Secondary | ICD-10-CM | POA: Diagnosis not present

## 2024-08-20 LAB — CBC WITH DIFFERENTIAL/PLATELET
Basophils Absolute: 0 x10E3/uL (ref 0.0–0.2)
Basos: 1 %
EOS (ABSOLUTE): 0.4 x10E3/uL (ref 0.0–0.4)
Eos: 11 %
Hematocrit: 41.1 % (ref 37.5–51.0)
Hemoglobin: 13.2 g/dL (ref 13.0–17.7)
Immature Grans (Abs): 0 x10E3/uL (ref 0.0–0.1)
Immature Granulocytes: 0 %
Lymphocytes Absolute: 0.3 x10E3/uL — ABNORMAL LOW (ref 0.7–3.1)
Lymphs: 8 %
MCH: 29.2 pg (ref 26.6–33.0)
MCHC: 32.1 g/dL (ref 31.5–35.7)
MCV: 91 fL (ref 79–97)
Monocytes Absolute: 0.5 x10E3/uL (ref 0.1–0.9)
Monocytes: 14 %
Neutrophils Absolute: 2.3 x10E3/uL (ref 1.4–7.0)
Neutrophils: 66 %
Platelets: 174 x10E3/uL (ref 150–450)
RBC: 4.52 x10E6/uL (ref 4.14–5.80)
RDW: 13.3 % (ref 11.6–15.4)
WBC: 3.5 x10E3/uL (ref 3.4–10.8)

## 2024-08-20 LAB — RAD ONC ARIA SESSION SUMMARY
Course Elapsed Days: 41
Plan Fractions Treated to Date: 3
Plan Prescribed Dose Per Fraction: 2 Gy
Plan Total Fractions Prescribed: 15
Plan Total Prescribed Dose: 30 Gy
Reference Point Dosage Given to Date: 6 Gy
Reference Point Session Dosage Given: 2 Gy
Session Number: 28

## 2024-08-20 NOTE — Patient Instructions (Signed)
 Mr. Kader,  Thank you for taking the time for your Medicare Wellness Visit. I appreciate your continued commitment to your health goals. Please review the care plan we discussed, and feel free to reach out if I can assist you further.  Medicare recommends these wellness visits once per year to help you and your care team stay ahead of potential health issues. These visits are designed to focus on prevention, allowing your provider to concentrate on managing your acute and chronic conditions during your regular appointments.  Please note that Annual Wellness Visits do not include a physical exam. Some assessments may be limited, especially if the visit was conducted virtually. If needed, we may recommend a separate in-person follow-up with your provider.  Ongoing Care Seeing your primary care provider every 3 to 6 months helps us  monitor your health and provide consistent, personalized care.   Referrals If a referral was made during today's visit and you haven't received any updates within two weeks, please contact the referred provider directly to check on the status.  Recommended Screenings:  Health Maintenance  Topic Date Due   COVID-19 Vaccine (8 - Moderna risk 2024-25 season) 07/28/2024   Medicare Annual Wellness Visit  08/20/2025   Colon Cancer Screening  03/28/2026   DTaP/Tdap/Td vaccine (2 - Td or Tdap) 06/03/2029   Pneumococcal Vaccine for age over 56  Completed   Flu Shot  Completed   Hepatitis C Screening  Completed   Zoster (Shingles) Vaccine  Completed   HPV Vaccine  Aged Out   Meningitis B Vaccine  Aged Out       08/20/2024    9:10 AM  Advanced Directives  Does Patient Have a Medical Advance Directive? No  Would patient like information on creating a medical advance directive? No - Patient declined   Advance Care Planning is important because it: Ensures you receive medical care that aligns with your values, goals, and preferences. Provides guidance to your family  and loved ones, reducing the emotional burden of decision-making during critical moments.  Vision: Annual vision screenings are recommended for early detection of glaucoma, cataracts, and diabetic retinopathy. These exams can also reveal signs of chronic conditions such as diabetes and high blood pressure.  Dental: Annual dental screenings help detect early signs of oral cancer, gum disease, and other conditions linked to overall health, including heart disease and diabetes.  Please see the attached documents for additional preventive care recommendations.

## 2024-08-20 NOTE — Progress Notes (Signed)
 Subjective:   Marc [name] is a 73 y.o. who presents for a Medicare Wellness preventive visit.  As a reminder, Annual Wellness Visits don't include a physical exam, and some assessments may be limited, especially if this visit is performed virtually. We may recommend an in-person follow-up visit with your provider if needed.  Visit Complete: Virtual I connected with  Adi Stay on 08/20/24 by a audio enabled telemedicine application and verified that I am speaking with the correct person using two identifiers.  Patient Location: Home  Provider Location: Office/Clinic  I discussed the limitations of evaluation and management by telemedicine. The patient expressed understanding and agreed to proceed.  Vital Signs: Because this visit was a virtual/telehealth visit, some criteria may be missing or patient reported. Any vitals not documented were not able to be obtained and vitals that have been documented are patient reported.  VideoError- Librarian, academic were attempted between this provider and patient, however failed, due to patient having technical difficulties OR patient did not have access to video capability.  We continued and completed visit with audio only.   Persons Participating in Visit: Patient.  AWV Questionnaire: No: Patient Medicare AWV questionnaire was not completed prior to this visit.  Cardiac Risk Factors include: advanced age (>59men, >42 women);hypertension;male gender     Objective:    Today's Vitals   08/20/24 0856  PainSc: 7    There is no height or weight on file to calculate BMI.     08/20/2024    9:10 AM 06/20/2024    5:53 AM 03/04/2024    2:17 PM 08/15/2023    9:02 AM 08/03/2022    9:13 AM 07/07/2021   10:12 AM 06/12/2021    9:26 AM  Advanced Directives  Does Patient Have a Medical Advance Directive? No No No No No No No  Would patient like information on creating a medical advance directive? No - Patient declined No -  Patient declined  No - Patient declined No - Patient declined Yes (MAU/Ambulatory/Procedural Areas - Information given) No - Patient declined    Current Medications (verified) Outpatient Encounter Medications as of 08/20/2024  Medication Sig   atorvastatin  (LIPITOR) 40 MG tablet TAKE 1 TABLET DAILY MONDAY - SATURDAY. SKIP SUNDAYS   gabapentin  (NEURONTIN ) 300 MG capsule TAKE 1 CAPSULE BY MOUTH EVERYDAY AT BEDTIME   LUMIGAN 0.01 % SOLN SMARTSIG:1 Drop(s) In Eye(s) Every Evening   magnesium gluconate (MAGONATE) 500 MG tablet Take 500 mg by mouth daily.   ORGOVYX 120 MG tablet Take 120 mg by mouth daily.   rivaroxaban  (XARELTO ) 10 MG TABS tablet Take 1 tablet (10 mg total) by mouth daily.   telmisartan  (MICARDIS ) 20 MG tablet Take 1 tablet (20 mg total) by mouth daily.   VITAMIN D , CHOLECALCIFEROL, PO Take by mouth daily.   No facility-administered encounter medications on file as of 08/20/2024.    Allergies (verified) Patient has no known allergies.   History: Past Medical History:  Diagnosis Date   Elevated PSA    History of cocaine abuse (HCC)    Hypertension    Osteoarthritis    Past Surgical History:  Procedure Laterality Date   GOLD SEED IMPLANT N/A 06/20/2024   Procedure: INSERTION, GOLD SEEDS;  Surgeon: Nieves Cough, MD;  Location: [facility] OR;  Service: Urology;  Laterality: N/A;   JOINT REPLACEMENT  06/22/11   KNEE ARTHROPLASTY Left 06/21/2011   Dempsey Moan, MD   PROSTATE BIOPSY     SPACE OAR INSTILLATION N/A  06/20/2024   Procedure: INJECTION, HYDROGEL SPACER;  Surgeon: Nieves Cough, MD;  Location: [facility] OR;  Service: Urology;  Laterality: N/A;   Family History  Problem Relation Age of Onset   Stroke Mother 68   Hypertension Mother    Healthy Father    Social History   Socioeconomic History   Marital status: Single    Spouse name: Not on file   Number of children: 0   Years of education: [employer]   Highest education level: Bachelor's degree (e.g., BA, AB, BS)   Occupational History   Occupation: Scientist, forensic: UNEMPLOYED    Comment: since 2009   Occupation: retired  Tobacco Use   Smoking status: Never   Smokeless tobacco: Never  Vaping Use   Vaping status: Never Used  Substance and Sexual Activity   Alcohol use: Not Currently    Comment: occasional   Drug use: Not Currently    Comment: Last in Nov 2013   Sexual activity: Not Currently    Birth control/protection: Condom  Other Topics Concern   Not on file  Social History Narrative   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 73 yo)   Social Drivers of Corporate investment banker Strain: Low Risk  (08/20/2024)   Overall Financial Resource Strain (CARDIA)    Difficulty of Paying Living Expenses: Not hard at all  Food Insecurity: No Food Insecurity (08/20/2024)   Hunger Vital Sign    Worried About Running Out of Food in the Last Year: Never true    Ran Out of Food in the Last Year: Never true  Transportation Needs: No Transportation Needs (08/20/2024)   PRAPARE - Administrator, Civil Service (Medical): No    Lack of Transportation (Non-Medical): No  Physical Activity: Inactive (08/20/2024)   Exercise Vital Sign    Days of Exercise per Week: 0 days    Minutes of Exercise per Session: 0 min  Stress: No Stress Concern Present (08/20/2024)   [employer] of Occupational Health - Occupational Stress Questionnaire    Feeling of Stress: Not at all  Social Connections: Moderately Isolated (08/20/2024)   Social Connection and Isolation Panel    Frequency of Communication with Friends and Family: More than three times a week    Frequency of Social Gatherings with Friends and Family: More than three times a week    Attends Religious Services: 1 to 4 times per year    Active Member of [employer] or Organizations: No    Attends Engineer, structural: Never    Marital Status: Never married    Tobacco Counseling Counseling given: Not Answered    Clinical  Intake:  Pre-visit preparation completed: Yes  Pain : 0-10 Pain Score: 7  Pain Type: Acute pain Pain Location: Pelvis Pain Orientation: Right, Left Pain Descriptors / Indicators: Aching Pain Onset: Yesterday Pain Frequency: Constant     Nutritional Risks: None Diabetes: No  Lab Results  Component Value Date   HGBA1C 5.3 06/27/2022   HGBA1C 5.4 06/09/2020   HGBA1C 5.1 05/29/2013     How often do you need to have someone help you when you read instructions, pamphlets, or other written materials from your doctor or pharmacy?: 1 - Never  Interpreter Needed?: No  Information entered by :: NAllen LPN   Activities of Daily Living     08/20/2024    9:04 AM 07/22/2024    3:51 PM  In your present state of health, do you have any  difficulty performing the following activities:  Hearing? 0 0  Vision? 1 0  Comment can't read small stuff   Difficulty concentrating or making decisions? 0 0  Walking or climbing stairs? 0 0  Dressing or bathing? 0 0  Doing errands, shopping? 0 0  Preparing Food and eating ? N   Using the Toilet? N   In the past six months, have you accidently leaked urine? N   Do you have problems with loss of bowel control? N   Managing your Medications? N   Managing your Finances? N   Housekeeping or managing your Housekeeping? N     Patient Care Team: Jarold Medici, MD as PCP - General (Internal Medicine) Delford Maude BROCKS, MD as PCP - Cardiology (Cardiology) Vertell Pont, RN as Oncology Nurse Navigator  I have updated your Care Teams any recent Medical Services you may have received from other providers in the past year.     Assessment:   This is a routine wellness examination for Marc [name].  Hearing/Vision screen Hearing Screening - Comments:: Denies hearing issues Vision Screening - Comments:: Regular eye exams, Dr. Cyrus   Goals Addressed             This Visit's Progress    Patient Stated       08/20/2024, get through surgery and heal  up       Depression Screen     08/20/2024    9:12 AM 07/22/2024    2:11 PM 03/04/2024    2:26 PM 08/15/2023    9:04 AM 07/02/2023    2:09 PM 09/20/2022    4:22 PM 08/03/2022    9:14 AM  PHQ 2/9 Scores  PHQ - 2 Score 0 0 0 0 0 0 0  PHQ- 9 Score 6 0  3 0      Fall Risk     08/20/2024    9:11 AM 07/22/2024    2:11 PM 08/14/2023    1:22 PM 07/02/2023    2:09 PM 09/20/2022    4:22 PM  Fall Risk   Falls in the past year? 1 0 0 0 0  Comment fell off porch      Number falls in past yr: 0 0 0 0 0  Injury with Fall? 1 0 0 0 0  Comment hurt pelvis and hip      Risk for fall due to : Medication side effect No Fall Risks Medication side effect No Fall Risks No Fall Risks  Follow up Falls prevention discussed;Falls evaluation completed Falls evaluation completed Falls prevention discussed;Falls evaluation completed Falls evaluation completed Falls evaluation completed      Data saved with a previous flowsheet row definition    MEDICARE RISK AT HOME:  Medicare Risk at Home Any stairs in or around the home?: Yes If so, are there any without handrails?: Yes Home free of loose throw rugs in walkways, pet beds, electrical cords, etc?: Yes Adequate lighting in your home to reduce risk of falls?: Yes Life alert?: No Use of a cane, walker or w/c?: No Grab bars in the bathroom?: No Shower chair or bench in shower?: No Elevated toilet seat or a handicapped toilet?: No  TIMED UP AND GO:  Was the test performed?  No  Cognitive Function: 6CIT completed        08/20/2024    9:14 AM 08/15/2023    9:08 AM 08/03/2022    9:15 AM 07/07/2021   10:16 AM 06/09/2020   10:50 AM  6CIT Screen  What Year? 0 points 0 points 0 points 0 points 0 points  What month? 0 points 0 points 0 points 0 points 0 points  What time? 0 points 0 points 0 points 0 points 0 points  Count back from 20 0 points 0 points 0 points 0 points 0 points  Months in reverse 0 points 0 points 0 points 0 points 0 points  Repeat phrase 0  points 0 points 0 points 0 points 0 points  Total Score 0 points 0 points 0 points 0 points 0 points    Immunizations Immunization History  Administered Date(s) Administered   Fluad Quad(high Dose 65+) 08/14/2019, 08/03/2022   Fluad Trivalent(High Dose 65+) 08/15/2023   INFLUENZA, HIGH DOSE SEASONAL PF 08/27/2018, 08/09/2021, 08/19/2024   Influenza-Unspecified 08/14/2019, 07/23/2020   Moderna Covid-19 Fall Seasonal Vaccine 46yrs & older 09/02/2023   Moderna Covid-19 Vaccine Bivalent Booster 16yrs & up 08/10/2021, 09/12/2022   Moderna Sars-Covid-2 Vaccination 12/19/2019, 01/18/2020, 09/21/2020, 04/24/2021   Pneumococcal Conjugate-13 05/10/2018   Pneumococcal Polysaccharide-23 03/01/2016, 06/08/2019   Pneumococcal-Unspecified 05/10/2018   Tdap 06/04/2019   Zoster Recombinant(Shingrix) 05/09/2020, 07/23/2020    Screening Tests Health Maintenance  Topic Date Due   COVID-19 Vaccine (8 - Moderna risk 2024-25 season) 07/28/2024   Medicare Annual Wellness (AWV)  08/20/2025   Colonoscopy  03/28/2026   DTaP/Tdap/Td (2 - Td or Tdap) 06/03/2029   Pneumococcal Vaccine: 50+ Years  Completed   Influenza Vaccine  Completed   Hepatitis C Screening  Completed   Zoster Vaccines- Shingrix  Completed   HPV VACCINES  Aged Out   Meningococcal B Vaccine  Aged Out    Health Maintenance Items Addressed: Due for covid vaccinee.  Additional Screening:  Vision Screening: Recommended annual ophthalmology exams for early detection of glaucoma and other disorders of the eye. Is the patient up to date with their annual eye exam?  Yes  Who is the provider or what is the name of the office in which the patient attends annual eye exams? Dr. Cyrus  Dental Screening: Recommended annual dental exams for proper oral hygiene  Community Resource Referral / Chronic Care Management: CRR required this visit?  No   CCM required this visit?  No   Plan:    I have personally reviewed and noted the  following in the patient's chart:   Medical and social history Use of alcohol, tobacco or illicit drugs  Current medications and supplements including opioid prescriptions. Patient is not currently taking opioid prescriptions. Functional ability and status Nutritional status Physical activity Advanced directives List of other physicians Hospitalizations, surgeries, and ER visits in previous 12 months Vitals Screenings to include cognitive, depression, and falls Referrals and appointments  In addition, I have reviewed and discussed with patient certain preventive protocols, quality metrics, and best practice recommendations. A written personalized care plan for preventive services as well as general preventive health recommendations were provided to patient.   Ardella FORBES Dawn, LPN   [id number]   After Visit Summary: (MyChart) Due to this being a telephonic visit, the after visit summary with patients personalized plan was offered to patient via MyChart   Notes: Nothing significant to report at this time.

## 2024-08-21 ENCOUNTER — Other Ambulatory Visit: Payer: Self-pay

## 2024-08-21 ENCOUNTER — Ambulatory Visit
Admission: RE | Admit: 2024-08-21 | Discharge: 2024-08-21 | Disposition: A | Discharge status: Home / Self Care | Source: Ambulatory Visit | Attending: Radiation Oncology | Admitting: Radiation Oncology

## 2024-08-21 DIAGNOSIS — Z510 Encounter for antineoplastic radiation therapy: Secondary | ICD-10-CM | POA: Diagnosis not present

## 2024-08-21 LAB — RAD ONC ARIA SESSION SUMMARY
Course Elapsed Days: 42
Plan Fractions Treated to Date: 4
Plan Prescribed Dose Per Fraction: 2 Gy
Plan Total Fractions Prescribed: 15
Plan Total Prescribed Dose: 30 Gy
Reference Point Dosage Given to Date: 8 Gy
Reference Point Session Dosage Given: 2 Gy
Session Number: 29

## 2024-08-22 ENCOUNTER — Other Ambulatory Visit: Payer: Self-pay

## 2024-08-22 ENCOUNTER — Ambulatory Visit
Admission: RE | Admit: 2024-08-22 | Discharge: 2024-08-22 | Disposition: A | Discharge status: Home / Self Care | Source: Ambulatory Visit | Attending: Radiation Oncology | Admitting: Radiation Oncology

## 2024-08-22 DIAGNOSIS — Z510 Encounter for antineoplastic radiation therapy: Secondary | ICD-10-CM | POA: Diagnosis not present

## 2024-08-22 LAB — RAD ONC ARIA SESSION SUMMARY
Course Elapsed Days: 43
Plan Fractions Treated to Date: 5
Plan Prescribed Dose Per Fraction: 2 Gy
Plan Total Fractions Prescribed: 15
Plan Total Prescribed Dose: 30 Gy
Reference Point Dosage Given to Date: 10 Gy
Reference Point Session Dosage Given: 2 Gy
Session Number: 30

## 2024-08-23 ENCOUNTER — Ambulatory Visit: Payer: Self-pay | Admitting: Internal Medicine

## 2024-08-25 ENCOUNTER — Other Ambulatory Visit: Payer: Self-pay

## 2024-08-25 ENCOUNTER — Ambulatory Visit
Admission: RE | Admit: 2024-08-25 | Discharge: 2024-08-25 | Disposition: A | Discharge status: Home / Self Care | Source: Ambulatory Visit | Attending: Radiation Oncology | Admitting: Radiation Oncology

## 2024-08-25 DIAGNOSIS — Z510 Encounter for antineoplastic radiation therapy: Secondary | ICD-10-CM | POA: Diagnosis not present

## 2024-08-25 LAB — RAD ONC ARIA SESSION SUMMARY
Course Elapsed Days: 46
Plan Fractions Treated to Date: 6
Plan Prescribed Dose Per Fraction: 2 Gy
Plan Total Fractions Prescribed: 15
Plan Total Prescribed Dose: 30 Gy
Reference Point Dosage Given to Date: 12 Gy
Reference Point Session Dosage Given: 2 Gy
Session Number: 31

## 2024-08-26 ENCOUNTER — Other Ambulatory Visit: Payer: Self-pay

## 2024-08-26 ENCOUNTER — Ambulatory Visit
Admission: RE | Admit: 2024-08-26 | Discharge: 2024-08-26 | Disposition: A | Discharge status: Home / Self Care | Source: Ambulatory Visit | Attending: Radiation Oncology | Admitting: Radiation Oncology

## 2024-08-26 DIAGNOSIS — Z510 Encounter for antineoplastic radiation therapy: Secondary | ICD-10-CM | POA: Diagnosis not present

## 2024-08-26 LAB — RAD ONC ARIA SESSION SUMMARY
Course Elapsed Days: 47
Plan Fractions Treated to Date: 7
Plan Prescribed Dose Per Fraction: 2 Gy
Plan Total Fractions Prescribed: 15
Plan Total Prescribed Dose: 30 Gy
Reference Point Dosage Given to Date: 14 Gy
Reference Point Session Dosage Given: 2 Gy
Session Number: 32

## 2024-08-27 ENCOUNTER — Other Ambulatory Visit: Payer: Self-pay

## 2024-08-27 ENCOUNTER — Ambulatory Visit
Admission: RE | Admit: 2024-08-27 | Discharge: 2024-08-27 | Disposition: A | Discharge status: Home / Self Care | Source: Ambulatory Visit | Attending: Radiation Oncology | Admitting: Radiation Oncology

## 2024-08-27 DIAGNOSIS — Z510 Encounter for antineoplastic radiation therapy: Secondary | ICD-10-CM | POA: Diagnosis present

## 2024-08-27 DIAGNOSIS — C61 Malignant neoplasm of prostate: Secondary | ICD-10-CM | POA: Diagnosis present

## 2024-08-27 LAB — RAD ONC ARIA SESSION SUMMARY
Course Elapsed Days: 48
Plan Fractions Treated to Date: 8
Plan Prescribed Dose Per Fraction: 2 Gy
Plan Total Fractions Prescribed: 15
Plan Total Prescribed Dose: 30 Gy
Reference Point Dosage Given to Date: 16 Gy
Reference Point Session Dosage Given: 2 Gy
Session Number: 33

## 2024-08-28 ENCOUNTER — Other Ambulatory Visit: Payer: Self-pay

## 2024-08-28 ENCOUNTER — Other Ambulatory Visit

## 2024-08-28 ENCOUNTER — Ambulatory Visit
Admission: RE | Admit: 2024-08-28 | Discharge: 2024-08-28 | Disposition: A | Discharge status: Home / Self Care | Source: Ambulatory Visit | Attending: Radiation Oncology | Admitting: Radiation Oncology

## 2024-08-28 DIAGNOSIS — Z510 Encounter for antineoplastic radiation therapy: Secondary | ICD-10-CM | POA: Diagnosis not present

## 2024-08-28 LAB — RAD ONC ARIA SESSION SUMMARY
Course Elapsed Days: 49
Plan Fractions Treated to Date: 9
Plan Prescribed Dose Per Fraction: 2 Gy
Plan Total Fractions Prescribed: 15
Plan Total Prescribed Dose: 30 Gy
Reference Point Dosage Given to Date: 18 Gy
Reference Point Session Dosage Given: 2 Gy
Session Number: 34

## 2024-08-29 ENCOUNTER — Ambulatory Visit
Admission: RE | Admit: 2024-08-29 | Discharge: 2024-08-29 | Disposition: A | Discharge status: Home / Self Care | Source: Ambulatory Visit | Attending: Radiation Oncology | Admitting: Radiation Oncology

## 2024-08-29 ENCOUNTER — Other Ambulatory Visit: Payer: Self-pay

## 2024-08-29 DIAGNOSIS — Z510 Encounter for antineoplastic radiation therapy: Secondary | ICD-10-CM | POA: Diagnosis not present

## 2024-08-29 LAB — RAD ONC ARIA SESSION SUMMARY
Course Elapsed Days: 50
Plan Fractions Treated to Date: 10
Plan Prescribed Dose Per Fraction: 2 Gy
Plan Total Fractions Prescribed: 15
Plan Total Prescribed Dose: 30 Gy
Reference Point Dosage Given to Date: 20 Gy
Reference Point Session Dosage Given: 2 Gy
Session Number: 35

## 2024-09-01 ENCOUNTER — Ambulatory Visit
Admission: RE | Admit: 2024-09-01 | Discharge: 2024-09-01 | Disposition: A | Source: Ambulatory Visit | Attending: Radiation Oncology

## 2024-09-01 ENCOUNTER — Other Ambulatory Visit: Payer: Self-pay

## 2024-09-01 DIAGNOSIS — Z510 Encounter for antineoplastic radiation therapy: Secondary | ICD-10-CM | POA: Diagnosis not present

## 2024-09-01 LAB — RAD ONC ARIA SESSION SUMMARY
Course Elapsed Days: 53
Plan Fractions Treated to Date: 11
Plan Prescribed Dose Per Fraction: 2 Gy
Plan Total Fractions Prescribed: 15
Plan Total Prescribed Dose: 30 Gy
Reference Point Dosage Given to Date: 22 Gy
Reference Point Session Dosage Given: 2 Gy
Session Number: 36

## 2024-09-02 ENCOUNTER — Ambulatory Visit
Admission: RE | Admit: 2024-09-02 | Discharge: 2024-09-02 | Disposition: A | Source: Ambulatory Visit | Attending: Radiation Oncology

## 2024-09-02 ENCOUNTER — Other Ambulatory Visit: Payer: Self-pay

## 2024-09-02 DIAGNOSIS — Z510 Encounter for antineoplastic radiation therapy: Secondary | ICD-10-CM | POA: Diagnosis not present

## 2024-09-02 LAB — RAD ONC ARIA SESSION SUMMARY
Course Elapsed Days: 54
Plan Fractions Treated to Date: 12
Plan Prescribed Dose Per Fraction: 2 Gy
Plan Total Fractions Prescribed: 15
Plan Total Prescribed Dose: 30 Gy
Reference Point Dosage Given to Date: 24 Gy
Reference Point Session Dosage Given: 2 Gy
Session Number: 37

## 2024-09-03 ENCOUNTER — Other Ambulatory Visit: Payer: Self-pay

## 2024-09-03 ENCOUNTER — Ambulatory Visit
Admission: RE | Admit: 2024-09-03 | Discharge: 2024-09-03 | Disposition: A | Discharge status: Home / Self Care | Source: Ambulatory Visit | Attending: Radiation Oncology | Admitting: Radiation Oncology

## 2024-09-03 DIAGNOSIS — Z510 Encounter for antineoplastic radiation therapy: Secondary | ICD-10-CM | POA: Diagnosis not present

## 2024-09-03 LAB — RAD ONC ARIA SESSION SUMMARY
Course Elapsed Days: 55
Plan Fractions Treated to Date: 13
Plan Prescribed Dose Per Fraction: 2 Gy
Plan Total Fractions Prescribed: 15
Plan Total Prescribed Dose: 30 Gy
Reference Point Dosage Given to Date: 26 Gy
Reference Point Session Dosage Given: 2 Gy
Session Number: 38

## 2024-09-04 ENCOUNTER — Ambulatory Visit
Admission: RE | Admit: 2024-09-04 | Discharge: 2024-09-04 | Disposition: A | Discharge status: Home / Self Care | Source: Ambulatory Visit | Attending: Radiation Oncology | Admitting: Radiation Oncology

## 2024-09-04 ENCOUNTER — Ambulatory Visit

## 2024-09-04 ENCOUNTER — Other Ambulatory Visit: Payer: Self-pay

## 2024-09-04 ENCOUNTER — Ambulatory Visit: Admission: RE | Admit: 2024-09-04 | Discharge: 2024-09-04 | Attending: Radiation Oncology

## 2024-09-04 DIAGNOSIS — Z510 Encounter for antineoplastic radiation therapy: Secondary | ICD-10-CM | POA: Diagnosis not present

## 2024-09-04 LAB — RAD ONC ARIA SESSION SUMMARY
Course Elapsed Days: 56
Plan Fractions Treated to Date: 14
Plan Prescribed Dose Per Fraction: 2 Gy
Plan Total Fractions Prescribed: 15
Plan Total Prescribed Dose: 30 Gy
Reference Point Dosage Given to Date: 28 Gy
Reference Point Session Dosage Given: 2 Gy
Session Number: 39

## 2024-09-05 ENCOUNTER — Ambulatory Visit
Admission: RE | Admit: 2024-09-05 | Discharge: 2024-09-05 | Disposition: A | Discharge status: Home / Self Care | Source: Ambulatory Visit | Attending: Radiation Oncology | Admitting: Radiation Oncology

## 2024-09-05 ENCOUNTER — Ambulatory Visit

## 2024-09-05 ENCOUNTER — Other Ambulatory Visit: Payer: Self-pay

## 2024-09-05 DIAGNOSIS — Z510 Encounter for antineoplastic radiation therapy: Secondary | ICD-10-CM | POA: Diagnosis not present

## 2024-09-05 LAB — RAD ONC ARIA SESSION SUMMARY
Course Elapsed Days: 57
Plan Fractions Treated to Date: 15
Plan Prescribed Dose Per Fraction: 2 Gy
Plan Total Fractions Prescribed: 15
Plan Total Prescribed Dose: 30 Gy
Reference Point Dosage Given to Date: 30 Gy
Reference Point Session Dosage Given: 2 Gy
Session Number: 40

## 2024-09-05 NOTE — Progress Notes (Signed)
 Patient was a RadOnc Consult on 03/04/24 for his stage T1c adenocarcinoma of the prostate with Gleason score of 4+4, and PSA of 7.32.  Patient proceed with treatment recommendations of 8-week course of daily external beam radiation concurrent with ADT and had his final radiation treatment on 09/05/24.   Patient is scheduled for a post treatment nurse call on 10/07/24 and has his first post treatment PSA on 10/07/24 at [facility].

## 2024-09-09 DIAGNOSIS — S83271A Complex tear of lateral meniscus, current injury, right knee, initial encounter: Secondary | ICD-10-CM | POA: Diagnosis not present

## 2024-09-09 DIAGNOSIS — G8918 Other acute postprocedural pain: Secondary | ICD-10-CM | POA: Diagnosis not present

## 2024-09-09 DIAGNOSIS — M898X6 Other specified disorders of bone, lower leg: Secondary | ICD-10-CM | POA: Diagnosis not present

## 2024-09-11 NOTE — Radiation Completion Notes (Addendum)
  Radiation Oncology         [phone]) [phone] ________________________________  Name: Marc [name] MRN: [id number]  Date: 09/05/2024  DOB: 10/25/1951  Referring Physician: BENJAMIN HERRICK, M.D. Date of Service: 2024-09-11 Radiation Oncologist: Adina Barge, M.D. [facility]     RADIATION ONCOLOGY END OF TREATMENT NOTE     Diagnosis: 73 y.o. gentleman with Stage T1c adenocarcinoma of the prostate with Gleason score of 4+4, and PSA of 7.32.   Intent: Curative     ==========DELIVERED PLANS==========  First Treatment Date: 2024-07-10 Last Treatment Date: 2024-09-05   Plan Name: Prostate_Pel Site: Prostate Technique: IMRT Mode: Photon Dose Per Fraction: 1.8 Gy Prescribed Dose (Delivered / Prescribed): 45 Gy / 45 Gy Prescribed Fxs (Delivered / Prescribed): 25 / 25   Plan Name: Prostate_Bst Site: Prostate Technique: IMRT Mode: Photon Dose Per Fraction: 2 Gy Prescribed Dose (Delivered / Prescribed): 30 Gy / 30 Gy Prescribed Fxs (Delivered / Prescribed): 15 / 15     ==========ON TREATMENT VISIT DATES========== 2024-07-11, 2024-07-18, 2024-07-25, 2024-08-01, 2024-08-11, 2024-08-21, 2024-08-29, 2024-09-04   See weekly On Treatment Notes in Epic for details in the Media tab (listed as Progress notes on the On Treatment Visit Dates listed above).  He tolerated the daily radiation treatments well with only modest fatigue and mild increased LUTS.  The patient will receive a call in about one month from the radiation oncology department. He will continue follow up with his urologist, Dr. Cam, as well.  ------------------------------------------------   Donnice Barge, MD [facility]  Radiation Oncology Direct Dial: [phone]  Fax: [phone] [email or web address]  Skype  LinkedIn

## 2024-09-19 ENCOUNTER — Other Ambulatory Visit: Payer: Self-pay | Admitting: Urology

## 2024-09-19 DIAGNOSIS — C61 Malignant neoplasm of prostate: Secondary | ICD-10-CM

## 2024-09-23 NOTE — Progress Notes (Signed)
  Radiation Oncology         [phone]) [phone] ________________________________  Name: Marc [name] MRN: [id number]  Date of Service: 10/07/2024  DOB: 04-04-1951  Post Treatment Telephone Note  Diagnosis:  73 y.o. gentleman with Stage T1c adenocarcinoma of the prostate with Gleason score of 4+4, and PSA of 7.32.   First Treatment Date: 2024-07-10 Last Treatment Date: 2024-09-05   Plan Name: Prostate_Pel Site: Prostate Technique: IMRT Mode: Photon Dose Per Fraction: 1.8 Gy Prescribed Dose (Delivered / Prescribed): 45 Gy / 45 Gy Prescribed Fxs (Delivered / Prescribed): 25 / 25   Plan Name: Prostate_Bst Site: Prostate Technique: IMRT Mode: Photon Dose Per Fraction: 2 Gy Prescribed Dose (Delivered / Prescribed): 30 Gy / 30 Gy Prescribed Fxs (Delivered / Prescribed): 15 / 15  Pre Treatment IPSS Score: 7 (as documented in the provider consult note)  The patient was available for call today.   Symptoms of fatigue have improved since completing therapy.  Symptoms of bladder changes have not improved since completing therapy. Current symptoms include urinary frequency and nocturia, and no medications for bladder symptoms.  Symptoms of bowel changes have improved since completing therapy. No current symptoms.   Post Treatment IPSS Score:  6 IPSS Questionnaire (AUA-7): Over the past month.   1)  How often have you had a sensation of not emptying your bladder completely after you finish urinating?  0 - Not at all  2)  How often have you had to urinate again less than two hours after you finished urinating? 3 - About half the time  3)  How often have you found you stopped and started again several times when you urinated?  0 - Not at all  4) How difficult have you found it to postpone urination?  0 - Not at all  5) How often have you had a weak urinary stream?  0 - Not at all  6) How often have you had to push or strain to begin urination?  0 - Not at all  7) How many times did you most  typically get up to urinate from the time you went to bed until the time you got up in the morning?  3 - 3 times  Total score:  6. Which indicates mild symptoms  0-7 mildly symptomatic   8-19 moderately symptomatic   20-35 severely symptomatic   Patient has a scheduled follow up visit with his urologist, Dr. Morene Salines today 10/07/2024.  He was counseled that PSA levels will be drawn in the urology office, and was reassured that additional time is expected to improve bowel and bladder symptoms. He was encouraged to call back with concerns or questions regarding radiation.

## 2024-10-06 ENCOUNTER — Telehealth: Payer: Self-pay | Admitting: Podiatry

## 2024-10-06 NOTE — Telephone Encounter (Signed)
 Called and left message for patient to contact office discuss surgery as he had left a message to reschedule a surgery from earlier this year and will need to be seen again.

## 2024-10-07 ENCOUNTER — Ambulatory Visit
Admission: RE | Admit: 2024-10-07 | Discharge: 2024-10-07 | Disposition: A | Discharge status: Home / Self Care | Source: Ambulatory Visit | Attending: Radiation Oncology | Admitting: Radiation Oncology

## 2024-10-07 DIAGNOSIS — C61 Malignant neoplasm of prostate: Secondary | ICD-10-CM

## 2024-10-13 ENCOUNTER — Ambulatory Visit (INDEPENDENT_AMBULATORY_CARE_PROVIDER_SITE_OTHER): Admitting: Podiatry

## 2024-10-13 ENCOUNTER — Encounter: Payer: Self-pay | Admitting: Podiatry

## 2024-10-13 DIAGNOSIS — M21619 Bunion of unspecified foot: Secondary | ICD-10-CM | POA: Diagnosis not present

## 2024-10-13 DIAGNOSIS — M2042 Other hammer toe(s) (acquired), left foot: Secondary | ICD-10-CM

## 2024-10-15 ENCOUNTER — Telehealth: Payer: Self-pay | Admitting: Podiatry

## 2024-10-15 NOTE — Telephone Encounter (Signed)
 Called and scheduled patient for surgery on 11/11/2024. Patient not on any GLP1 medications but does take xarelto . Patient advised to d/c use 7 days prior to surgery. Patient pharmacy correct in chart.

## 2024-10-15 NOTE — Progress Notes (Signed)
 Subjective:   Patient ID: Marc [name], male   DOB: 73 y.o.   MRN: [id number]   HPI Patient presents with significant foot deformity left that was going to have surgery earlier this year but now needs to have this done.  States that it is hard to wear shoe gear has tried wider shoes he has tried other modalities without relief   ROS      Objective:  Physical Exam  Vascular status intact good digital perfusion noted significant structural bunion left deviation of the left toe against the second toe elevated 2nd, 3rd and 4th toes with distal deformity fourth toe left and proximal 2nd and 3rd.  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  We did discuss distal osteotomy versus Lapidus or first MPJ fusion and he has opted for distal osteotomy with probable Aiken osteotomy and digital fusion.  I did explain we will not get full correction with this but I am hopeful that this will give him a good result and he wants to proceed.  At this point he reviewed read consent form going over alternative treatments complications that can be done and he is willing to accept risk and signed consent form after extensive review.  Patient understands total recovery to take 6 months to 1 year and no guarantees and may require further surgery but hopeful that this will solve the problem.

## 2024-10-21 ENCOUNTER — Telehealth: Payer: Self-pay | Admitting: Podiatry

## 2024-10-21 NOTE — Telephone Encounter (Signed)
 DOS- 11/11/2024  KATRINA OSTEOTOMY LT- 71689 [place]UST[place] BUNIONECTOMY LT- 28296 2ND, 3RD, + 4TH HAMMERTOE REPAIR LT- 71714  [employer] EFFECTIVE DATE- 07/28/2024  DEDUCTIBLE- $257 REMAINING- $0 OOP- $9350 REMAINING- $4700.56 COINSURANCE- 20%  PER [employer] PORTAL, NO PRIOR AUTHS ARE REQUIRED FOR CPT CODES 71689, [id number], AND [phone] (3 UNITS). DECISION ID# [id number]

## 2024-10-22 ENCOUNTER — Other Ambulatory Visit: Payer: Self-pay | Admitting: Hematology and Oncology

## 2024-11-10 MED ORDER — OXYCODONE-ACETAMINOPHEN 10-325 MG PO TABS: 1.0000 | ORAL_TABLET | ORAL | 0 refills | Status: DC | PRN

## 2024-11-10 NOTE — Addendum Note (Signed)
 Addended by: MAGDALEN PASCO RAMAN on: 11/10/2024 01:30 PM   Modules accepted: Orders

## 2024-11-11 DIAGNOSIS — M2012 Hallux valgus (acquired), left foot: Secondary | ICD-10-CM | POA: Diagnosis not present

## 2024-11-11 DIAGNOSIS — M2042 Other hammer toe(s) (acquired), left foot: Secondary | ICD-10-CM | POA: Diagnosis not present

## 2024-11-13 ENCOUNTER — Ambulatory Visit: Payer: Self-pay | Admitting: Internal Medicine

## 2024-11-17 ENCOUNTER — Encounter: Payer: Self-pay | Admitting: Podiatry

## 2024-11-17 ENCOUNTER — Ambulatory Visit

## 2024-11-17 VITALS — BP 125/88 | HR 91 | Temp 99.3°F

## 2024-11-17 DIAGNOSIS — M21619 Bunion of unspecified foot: Secondary | ICD-10-CM

## 2024-11-17 DIAGNOSIS — M21612 Bunion of left foot: Secondary | ICD-10-CM | POA: Diagnosis not present

## 2024-11-17 DIAGNOSIS — M2042 Other hammer toe(s) (acquired), left foot: Secondary | ICD-10-CM | POA: Diagnosis not present

## 2024-11-17 NOTE — Progress Notes (Signed)
 Subjective:   Patient ID: Marc [name], male   DOB: 73 y.o.   MRN: [id number]   HPI Patient seen by nurse with foot surgery left stating doing well   ROS      Objective:  Physical Exam  Incision sites healing well wound edges well coapted all stitches intact     Assessment:  Doing well post foot surgery left     Plan:  H&P x-rays taken reviewed and I went ahead and reapplied sterile dressing patient to be seen back all questions answered with x-ray showing good alignment fixation in place

## 2024-11-17 NOTE — Progress Notes (Signed)
 Patient presents for post-op visit today, POV #1 DOS 11/11/2024 LT AIKEN OSTEOTOMY, LT AUSTIN BUNIONECTOMY, LT 2ND 3RD 4TH HAMMER TOE REPAIR  Doing pretty good. No issues at all..  Notes: n/a  Vital Signs: Today's Vitals   11/17/24 1410  BP: 125/88  Pulse: 91  Temp: 99.3 F (37.4 C)  TempSrc: Oral      Radiographs: [x]  Taken []  Not taken  Surgical Site Assessment:  - Dressing:  [x]  Minimal dry blood, intact []  Reinforced   []  Changed     -Notes: n/a  - Incision:  [x]  CDI (clean, dry, intact)  []  Mild erythema  []  Drainage noted   -Notes: n/a  - Swelling:  []  None  [x]  Mild  []  Moderate   []  Significant     -Notes: n/a  - Bruising:  []  None  [x]  Present: base of toes, along surgical incisions   - Sutures/Staples:  []  None [x]  Intact  []  Removed Today  [x]  Plan to remove at next visit   -Cast/Splint/Pins: []  None [x]  Intact []  Removed Today [x]  Plan to remove at next visit []  Replaced  -Signs of infection:  [x]  None  []  Present - Describe: n/a  -DME:    []  None [x]  AFW []  Surgical shoe []  Cast  []  Splint  -Walking status:  [x]  Full WB  []  Partial WB  []  NWB  -Utilizing device:  [x]  None []  Knee Scooter []  Crutches []  Wheelchair    DVT assessment:  [x]  Denies symptoms []  Chest pain/SOB []  Pain in calf/redness/warmth   Redressed DSD and ace wrap. Educated on signs of infection, proper dressing care, pain management, and weight bearing status. Patient will contact provider with any new or worsening symptoms. The provider assessed the patient today and reviewed instructions regarding plan of care.

## 2024-11-24 ENCOUNTER — Ambulatory Visit (INDEPENDENT_AMBULATORY_CARE_PROVIDER_SITE_OTHER): Admitting: Podiatry

## 2024-11-24 ENCOUNTER — Ambulatory Visit (INDEPENDENT_AMBULATORY_CARE_PROVIDER_SITE_OTHER)

## 2024-11-24 DIAGNOSIS — M2042 Other hammer toe(s) (acquired), left foot: Secondary | ICD-10-CM | POA: Diagnosis not present

## 2024-11-25 NOTE — Progress Notes (Signed)
 Subjective:   Patient ID: Marc [name], male   DOB: 73 y.o.   MRN: [id number]   HPI Patient states that he hit his left foot and is concerned about trauma to it after bumping it   ROS      Objective:  Physical Exam  Neurovascular status intact negative Toula' sign noted left big toe has been traumatized with a small blood blister secondary to trauma on the medial side and moderate swelling     Assessment:  Trauma to the left big toe with blood blister     Plan:  H&P x-ray reviewed did indicate there is been some movement of the left big toe secondary to the trauma but I do think it will heal at this position but I will have to watch it carefully and it is possible have to be extubated.  At this point I did reapply sterile dressing instructed on continuing this and patient will be seen back to recheck

## 2024-12-01 ENCOUNTER — Encounter

## 2024-12-01 ENCOUNTER — Encounter: Payer: Self-pay | Admitting: Podiatry

## 2024-12-01 DIAGNOSIS — M2042 Other hammer toe(s) (acquired), left foot: Secondary | ICD-10-CM

## 2024-12-01 NOTE — Progress Notes (Signed)
 Patient presents for post-op visit today, POV #2 DOS 11/11/2024 LT AIKEN OSTEOTOMY, LT [place] BUNIONECTOMY, LT 2ND 3RD 4TH HAMMER TOE REPAIR   I have been doing fine. Just rough going back and forth taking care of my father..  Notes: n/a  Vital Signs: Today's Vitals   12/01/24 0908  PainSc: 0-No pain      Radiographs: []  Taken [x]  Not taken  Surgical Site Assessment:  - Dressing:  [x]  Minimal dry blood, intact []  Reinforced   []  Changed     -Notes: drainage on dressing  - Incision:  [x]  CDI (clean, dry, intact)  [x]  Mild erythema  [x]  Drainage noted   -Notes: yellow drainage  - Swelling:  []  None  [x]  Mild  []  Moderate   []  Significant     -Notes: n.a  - Bruising:  [x]  None  []  Present: n/a   - Sutures/Staples:  []  None [x]  Intact  [x]  Removed Today  []  Plan to remove at next visit   -Cast/Splint/Pins: []  None [x]  Intact []  Removed Today [x]  Plan to remove at next visit []  Replaced  -Signs of infection:  []  None  []  Present - Describe: yellow drainage.   -DME:    []  None [x]  AFW []  Surgical shoe []  Cast  []  Splint  -Walking status:  [x]  Full WB  []  Partial WB  []  NWB  -Utilizing device:  [x]  None []  Knee Scooter []  Crutches []  Wheelchair    DVT assessment:  [x]  Denies symptoms []  Chest pain/SOB []  Pain in calf/redness/warmth   Redressed DSD and ace wrap. Educated on signs of infection, proper dressing care, pain management, and weight bearing status. Patient will contact provider with any new or worsening symptoms. The provider assessed the patient today and reviewed instructions regarding plan of care.  Attestation: I have seen and evaluated the patient with Ileana Ka RN.  Patient is progressing as expected postoperatively.  Continue protected weightbearing.  Continue passive range of motion exercises first MPJ.  Return to clinic in about 2 weeks for postop check.  Dr. Ethan Saddler, D.P.M.

## 2024-12-15 ENCOUNTER — Encounter: Payer: Self-pay | Admitting: Podiatry

## 2024-12-15 ENCOUNTER — Ambulatory Visit: Admitting: Podiatry

## 2024-12-15 ENCOUNTER — Ambulatory Visit (INDEPENDENT_AMBULATORY_CARE_PROVIDER_SITE_OTHER)

## 2024-12-15 DIAGNOSIS — M2042 Other hammer toe(s) (acquired), left foot: Secondary | ICD-10-CM

## 2024-12-15 NOTE — Progress Notes (Signed)
 Subjective:   Patient ID: Marc [name], male   DOB: 74 y.o.   MRN: [id number]   HPI Patient states overall doing very well pleased   ROS      Objective:  Physical Exam  Neurovascular status intact negative T[place]a' sign noted wound edges coapted well edema reduce     Assessment:  Overall doing well did have trauma to the big toe with some lifting of the osteotomy at the hallux itself but it appears to be doing very well and healing clinically with excellent range of motion of the MPJ     Plan:  H&P reviewed condition and recommended the continuation of immobilization and elevation.  Patient had the pins removed 2nd and 3rd toe good alignment of the digits and patient will be seen back to recheck 4 weeks or earlier if necessary but will remain immobilized for at least 2 more weeks  X-rays indicate that the big toe should heal uneventfully at this position and Scott elevation still could require revisional surgery but right now clinically is doing very well so we will be watched.  Everything else healing excellent

## 2025-01-12 ENCOUNTER — Encounter: Admitting: Podiatry

## 2025-07-23 ENCOUNTER — Encounter: Payer: Self-pay | Admitting: Internal Medicine

## 2025-09-23 ENCOUNTER — Ambulatory Visit

## 2025-09-23 ENCOUNTER — Ambulatory Visit: Payer: Self-pay
